# Patient Record
Sex: Female | Born: 1960
Health system: Southern US, Community
[De-identification: ages and names within clinical notes are randomized; demographics above are authoritative.]

## PROBLEM LIST (undated history)

## (undated) DIAGNOSIS — F32A Depression, unspecified: Secondary | ICD-10-CM

## (undated) DIAGNOSIS — F209 Schizophrenia, unspecified: Secondary | ICD-10-CM

## (undated) DIAGNOSIS — J309 Allergic rhinitis, unspecified: Secondary | ICD-10-CM

## (undated) DIAGNOSIS — F419 Anxiety disorder, unspecified: Secondary | ICD-10-CM

## (undated) DIAGNOSIS — F329 Major depressive disorder, single episode, unspecified: Secondary | ICD-10-CM

## (undated) HISTORY — DX: Allergic rhinitis, unspecified: J30.9

## (undated) HISTORY — DX: Anxiety disorder, unspecified: F41.9

## (undated) HISTORY — DX: Depression, unspecified: F32.A

## (undated) HISTORY — DX: Major depressive disorder, single episode, unspecified: F32.9

## (undated) HISTORY — PX: TONSILLECTOMY: SUR1361

---

## 2015-10-07 DIAGNOSIS — K134 Granuloma and granuloma-like lesions of oral mucosa: Secondary | ICD-10-CM | POA: Diagnosis not present

## 2015-10-07 DIAGNOSIS — J312 Chronic pharyngitis: Secondary | ICD-10-CM | POA: Diagnosis not present

## 2015-10-10 DIAGNOSIS — F250 Schizoaffective disorder, bipolar type: Secondary | ICD-10-CM | POA: Diagnosis not present

## 2015-10-24 DIAGNOSIS — K120 Recurrent oral aphthae: Secondary | ICD-10-CM | POA: Diagnosis not present

## 2015-12-05 DIAGNOSIS — F250 Schizoaffective disorder, bipolar type: Secondary | ICD-10-CM | POA: Diagnosis not present

## 2016-02-06 DIAGNOSIS — F250 Schizoaffective disorder, bipolar type: Secondary | ICD-10-CM | POA: Diagnosis not present

## 2016-03-19 DIAGNOSIS — F250 Schizoaffective disorder, bipolar type: Secondary | ICD-10-CM | POA: Diagnosis not present

## 2016-05-03 DIAGNOSIS — R5383 Other fatigue: Secondary | ICD-10-CM | POA: Diagnosis not present

## 2016-05-03 DIAGNOSIS — Z8659 Personal history of other mental and behavioral disorders: Secondary | ICD-10-CM | POA: Diagnosis not present

## 2016-05-03 DIAGNOSIS — M791 Myalgia: Secondary | ICD-10-CM | POA: Diagnosis not present

## 2016-05-04 DIAGNOSIS — F250 Schizoaffective disorder, bipolar type: Secondary | ICD-10-CM | POA: Diagnosis not present

## 2016-05-07 DIAGNOSIS — F250 Schizoaffective disorder, bipolar type: Secondary | ICD-10-CM | POA: Diagnosis not present

## 2016-05-25 DIAGNOSIS — Z1239 Encounter for other screening for malignant neoplasm of breast: Secondary | ICD-10-CM | POA: Diagnosis not present

## 2016-05-25 DIAGNOSIS — R922 Inconclusive mammogram: Secondary | ICD-10-CM | POA: Diagnosis not present

## 2016-05-25 DIAGNOSIS — Z1231 Encounter for screening mammogram for malignant neoplasm of breast: Secondary | ICD-10-CM | POA: Diagnosis not present

## 2016-06-03 DIAGNOSIS — F250 Schizoaffective disorder, bipolar type: Secondary | ICD-10-CM | POA: Diagnosis not present

## 2016-06-18 DIAGNOSIS — F250 Schizoaffective disorder, bipolar type: Secondary | ICD-10-CM | POA: Diagnosis not present

## 2016-06-24 DIAGNOSIS — F250 Schizoaffective disorder, bipolar type: Secondary | ICD-10-CM | POA: Diagnosis not present

## 2016-07-20 DIAGNOSIS — F250 Schizoaffective disorder, bipolar type: Secondary | ICD-10-CM | POA: Diagnosis not present

## 2016-07-27 DIAGNOSIS — F250 Schizoaffective disorder, bipolar type: Secondary | ICD-10-CM | POA: Diagnosis not present

## 2016-09-07 DIAGNOSIS — H2513 Age-related nuclear cataract, bilateral: Secondary | ICD-10-CM | POA: Diagnosis not present

## 2016-09-14 DIAGNOSIS — Z23 Encounter for immunization: Secondary | ICD-10-CM | POA: Diagnosis not present

## 2016-09-14 DIAGNOSIS — R5383 Other fatigue: Secondary | ICD-10-CM | POA: Diagnosis not present

## 2016-09-14 DIAGNOSIS — Z1211 Encounter for screening for malignant neoplasm of colon: Secondary | ICD-10-CM | POA: Diagnosis not present

## 2016-09-14 DIAGNOSIS — R351 Nocturia: Secondary | ICD-10-CM | POA: Diagnosis not present

## 2016-09-14 DIAGNOSIS — M791 Myalgia: Secondary | ICD-10-CM | POA: Diagnosis not present

## 2016-09-14 DIAGNOSIS — I2510 Atherosclerotic heart disease of native coronary artery without angina pectoris: Secondary | ICD-10-CM | POA: Diagnosis not present

## 2016-09-14 DIAGNOSIS — Z1212 Encounter for screening for malignant neoplasm of rectum: Secondary | ICD-10-CM | POA: Diagnosis not present

## 2016-09-14 DIAGNOSIS — Z0000 Encounter for general adult medical examination without abnormal findings: Secondary | ICD-10-CM | POA: Diagnosis not present

## 2016-09-14 DIAGNOSIS — Z7189 Other specified counseling: Secondary | ICD-10-CM | POA: Diagnosis not present

## 2016-09-14 DIAGNOSIS — Z8659 Personal history of other mental and behavioral disorders: Secondary | ICD-10-CM | POA: Diagnosis not present

## 2016-09-14 DIAGNOSIS — R002 Palpitations: Secondary | ICD-10-CM | POA: Diagnosis not present

## 2016-09-21 DIAGNOSIS — F6089 Other specific personality disorders: Secondary | ICD-10-CM | POA: Diagnosis not present

## 2016-09-21 DIAGNOSIS — F250 Schizoaffective disorder, bipolar type: Secondary | ICD-10-CM | POA: Diagnosis not present

## 2016-11-01 DIAGNOSIS — Z23 Encounter for immunization: Secondary | ICD-10-CM | POA: Diagnosis not present

## 2016-11-16 DIAGNOSIS — Z124 Encounter for screening for malignant neoplasm of cervix: Secondary | ICD-10-CM | POA: Diagnosis not present

## 2016-11-16 DIAGNOSIS — Z1212 Encounter for screening for malignant neoplasm of rectum: Secondary | ICD-10-CM | POA: Diagnosis not present

## 2016-11-16 DIAGNOSIS — R351 Nocturia: Secondary | ICD-10-CM | POA: Diagnosis not present

## 2016-11-16 DIAGNOSIS — Z1389 Encounter for screening for other disorder: Secondary | ICD-10-CM | POA: Diagnosis not present

## 2016-11-16 DIAGNOSIS — Z01419 Encounter for gynecological examination (general) (routine) without abnormal findings: Secondary | ICD-10-CM | POA: Diagnosis not present

## 2016-12-08 DIAGNOSIS — M545 Low back pain: Secondary | ICD-10-CM | POA: Diagnosis not present

## 2016-12-21 DIAGNOSIS — F6089 Other specific personality disorders: Secondary | ICD-10-CM | POA: Diagnosis not present

## 2016-12-21 DIAGNOSIS — F250 Schizoaffective disorder, bipolar type: Secondary | ICD-10-CM | POA: Diagnosis not present

## 2017-03-22 DIAGNOSIS — F6089 Other specific personality disorders: Secondary | ICD-10-CM | POA: Diagnosis not present

## 2017-03-22 DIAGNOSIS — F250 Schizoaffective disorder, bipolar type: Secondary | ICD-10-CM | POA: Diagnosis not present

## 2017-05-10 DIAGNOSIS — F250 Schizoaffective disorder, bipolar type: Secondary | ICD-10-CM | POA: Diagnosis not present

## 2017-05-10 DIAGNOSIS — F6089 Other specific personality disorders: Secondary | ICD-10-CM | POA: Diagnosis not present

## 2017-05-17 DIAGNOSIS — F250 Schizoaffective disorder, bipolar type: Secondary | ICD-10-CM | POA: Diagnosis not present

## 2017-05-17 DIAGNOSIS — F6089 Other specific personality disorders: Secondary | ICD-10-CM | POA: Diagnosis not present

## 2017-06-07 DIAGNOSIS — F250 Schizoaffective disorder, bipolar type: Secondary | ICD-10-CM | POA: Diagnosis not present

## 2017-06-07 DIAGNOSIS — F6089 Other specific personality disorders: Secondary | ICD-10-CM | POA: Diagnosis not present

## 2017-06-21 DIAGNOSIS — F6089 Other specific personality disorders: Secondary | ICD-10-CM | POA: Diagnosis not present

## 2017-06-21 DIAGNOSIS — F250 Schizoaffective disorder, bipolar type: Secondary | ICD-10-CM | POA: Diagnosis not present

## 2017-09-20 DIAGNOSIS — F39 Unspecified mood [affective] disorder: Secondary | ICD-10-CM | POA: Diagnosis not present

## 2017-10-11 ENCOUNTER — Encounter: Payer: Self-pay | Admitting: Family Medicine

## 2017-10-11 ENCOUNTER — Ambulatory Visit (INDEPENDENT_AMBULATORY_CARE_PROVIDER_SITE_OTHER): Payer: Medicare Other | Admitting: Family Medicine

## 2017-10-11 ENCOUNTER — Other Ambulatory Visit: Payer: Self-pay

## 2017-10-11 VITALS — BP 128/80 | HR 77 | Temp 98.5°F | Ht 62.0 in | Wt 146.6 lb

## 2017-10-11 DIAGNOSIS — R5383 Other fatigue: Secondary | ICD-10-CM

## 2017-10-11 DIAGNOSIS — Z0000 Encounter for general adult medical examination without abnormal findings: Secondary | ICD-10-CM | POA: Diagnosis not present

## 2017-10-11 DIAGNOSIS — F319 Bipolar disorder, unspecified: Secondary | ICD-10-CM | POA: Insufficient documentation

## 2017-10-11 MED ORDER — LORATADINE 10 MG PO TABS: 10.0000 mg | ORAL_TABLET | Freq: Every day | ORAL | 2 refills | Status: DC

## 2017-10-11 NOTE — Patient Instructions (Signed)
Thank you for coming to see me today. It was a pleasure! Today we talked about:   Your health. Please continue trying to exercise. I suggest you follow up closely with your psychologist.   It is recommended in your age group that you have a screening colonoscopy for colorectal cancer. I have put in the referral for this.   Please follow-up with me in 1 year or sooner, as needed.  If you have any questions or concerns, please do not hesitate to call the office at [phone].  Take Care,   [place] Kashon Kray[place], DO

## 2017-10-11 NOTE — Progress Notes (Signed)
Subjective:    Patient ID: Tanya Simpson, female    DOB: 01/10/1961, 57 y.o.   MRN: [id number]   CC: Establishing care with primary physician  HPI:  Healthcare Maintenance - Vaccines: flu-patient would like to get vaccine today - Colonoscopy: due and discussed with patient, - Mammogram:  Reportedly done in 2018 [place] Exam: Patient would like referral to an ophthalmologist - Lipid Panel: Patient unsure when her last blood work was drawn but does not believe it was done in the last year -Patient exercises 4-5 times per week and is trying to increase this amount.  She enjoys swimming and walking  Weakness: -Patient reporting that she has been feeling more tired than usual. -Patient with extensive psychological background that is followed closely by her psychologist. -Recently moved from [place] 6 months ago but has already established care with a psychologist in the area who will continue to counsel and manage her medications. -Patient says she has been told she has depression, anxiety, and bipolar disorder. -Patient believes that her current medication regimen and is good for her, however she thinks that her medications are causing her to be more tired. -Reports lightheadedness upon standing and when she gets out of bed quickly at night to use the bathroom. -Says this is been going on for a few months now.  Reports that she has had some history of anemia before. -Patient does not feel as if she is more depressed at this time.  She has things that she struggles with daily given that her previous husband committed suicide in 2 of her children no longer speak with her and blame her for her husband's death.  She reports that this often haunts her and she is in a bad place, but she feels like she can talk to her counselor and she believes that she has a good fit at her current office.  She is trying to establish care at this office in order to manage any other health issues that  may come up.  Smoking status reviewed  Review of Systems  Per HPI, else denies recent illness, fever, headache, changes in vision, chest pain, shortness of breath, abdominal pain, N/V/D, weakness  History reviewed. No pertinent surgical history.  Family History  Problem Relation Age of Onset  . Hypertension Mother   . CVA Father     Patient Active Problem List   Diagnosis Date Noted  . Healthcare maintenance 10/12/2017  . Fatigue 10/12/2017  . Depression      Objective:  BP 128/80   Pulse 77   Temp 98.5 F (36.9 C) (Oral)   Ht 5\' 2"  (1.575 m)   Wt 66.5 kg (146 lb 9.6 oz)   LMP 07/13/2017 (Approximate)   SpO2 100%   BMI 26.81 kg/m  Vitals and nursing note reviewed  General: NAD, pleasant Cardiac: RRR, normal heart sounds, no murmurs Respiratory: CTAB, normal effort Abdomen: soft, nontender, nondistended. Bowel sounds present Extremities: no edema or cyanosis. WWP. Skin: warm and dry, no rashes noted Neuro: alert and oriented, no focal deficits Psych: pressured speech  GAD 7 : Generalized Anxiety Score 10/11/2017  Nervous, Anxious, on Edge 0  Control/stop worrying 1  Worry too much - different things 0  Trouble relaxing 0  Restless 0  Easily annoyed or irritable 1  Afraid - awful might happen 1  Total GAD 7 Score 3  Anxiety Difficulty Somewhat difficult   Depression screen PHQ 2/9 10/11/2017  Decreased Interest 0  Down, Depressed,  Hopeless 0  PHQ - 2 Score 0  Altered sleeping 0  Tired, decreased energy 2  Change in appetite 1  Feeling bad or failure about yourself  0  Trouble concentrating 0  Moving slowly or fidgety/restless 0  Suicidal thoughts 0  PHQ-9 Score 3  Difficult doing work/chores Somewhat difficult    Assessment & Plan:    Healthcare maintenance Blood pressure is normal today at 128/80. Patient informed that she is at the age where she could require a screening colonoscopy.  Reports that she does not like the idea of this but she also  is not interested in alternate testing.  Given referral and patient encouraged to think about different options and explained FOBT testing vs FIT testing and colonoscopy options.   Also obtained Lipid Panel.  Encourage patient to continue her exercise and recommended a total of at least 150 minutes of cardio per week.  Fatigue We will obtain CBC to rule out anemia as cause of fatigue.  Patient also with extensive psychological history and PHQ 9 as well as gad 7 were scores of 3.  Patient encouraged to continue talking with her counselor as she is quite troubled by some of her things from her past.  Also instructed to allow time after sitting up in bed before going to the bathroom in the middle of night.      [name] Dekota Shenk, DO Family Medicine Resident PGY-1

## 2017-10-12 ENCOUNTER — Encounter: Payer: Self-pay | Admitting: Family Medicine

## 2017-10-12 DIAGNOSIS — R5383 Other fatigue: Secondary | ICD-10-CM | POA: Insufficient documentation

## 2017-10-12 DIAGNOSIS — Z0000 Encounter for general adult medical examination without abnormal findings: Secondary | ICD-10-CM | POA: Insufficient documentation

## 2017-10-12 LAB — CBC
HEMATOCRIT: 38 % (ref 34.0–46.6)
HEMOGLOBIN: 12.7 g/dL (ref 11.1–15.9)
MCH: 30.9 pg (ref 26.6–33.0)
MCHC: 33.4 g/dL (ref 31.5–35.7)
MCV: 93 fL (ref 79–97)
Platelets: 365 10*3/uL (ref 150–379)
RBC: 4.11 x10E6/uL (ref 3.77–5.28)
RDW: 12.8 % (ref 12.3–15.4)
WBC: 8.9 10*3/uL (ref 3.4–10.8)

## 2017-10-12 LAB — LIPID PANEL
CHOL/HDL RATIO: 2.8 ratio (ref 0.0–4.4)
CHOLESTEROL TOTAL: 187 mg/dL (ref 100–199)
HDL: 66 mg/dL (ref >39)
LDL Calculated: 100 mg/dL — ABNORMAL HIGH (ref 0–99)
TRIGLYCERIDES: 105 mg/dL (ref 0–149)
VLDL Cholesterol Cal: 21 mg/dL (ref 5–40)

## 2017-10-12 NOTE — Assessment & Plan Note (Addendum)
Blood pressure is normal today at 128/80. Patient informed that she is at the age where she could require a screening colonoscopy.  Reports that she does not like the idea of this but she also is not interested in alternate testing.  Given referral and patient encouraged to think about different options and explained FOBT testing vs FIT testing and colonoscopy options.   Also obtained Lipid Panel.  Encourage patient to continue her exercise and recommended a total of at least 150 minutes of cardio per week.

## 2017-10-12 NOTE — Assessment & Plan Note (Signed)
We will obtain CBC to rule out anemia as cause of fatigue.  Patient also with extensive psychological history and PHQ 9 as well as gad 7 were scores of 3.  Patient encouraged to continue talking with her counselor as she is quite troubled by some of her things from her past.  Also instructed to allow time after sitting up in bed before going to the bathroom in the middle of night.

## 2017-11-09 DIAGNOSIS — F39 Unspecified mood [affective] disorder: Secondary | ICD-10-CM | POA: Diagnosis not present

## 2017-12-07 DIAGNOSIS — F39 Unspecified mood [affective] disorder: Secondary | ICD-10-CM | POA: Diagnosis not present

## 2018-02-08 DIAGNOSIS — F39 Unspecified mood [affective] disorder: Secondary | ICD-10-CM | POA: Diagnosis not present

## 2018-02-28 ENCOUNTER — Ambulatory Visit (INDEPENDENT_AMBULATORY_CARE_PROVIDER_SITE_OTHER): Payer: Medicare Other | Admitting: Psychology

## 2018-02-28 DIAGNOSIS — F319 Bipolar disorder, unspecified: Secondary | ICD-10-CM

## 2018-02-28 NOTE — Patient Instructions (Signed)
Please schedule a follow-up appointment with Dr. Pascal Lux at 10:30 on June 11th.  You can call me at [phone] if you need to reschedule this appointment.  We talked about tackling a few issues:  One is the relationship with your daughter and the second is your activity level.  Hopefully you can feel less fatigued.

## 2018-02-28 NOTE — Progress Notes (Signed)
Patient requested an integrated care appointment after learning about the services from her husband, who has seen Sammuel Hines.  She has seen a counselor connecting to her psychiatric care two times but the copay is cost-prohibitive.    Presenting Issue:  She has a significant psychiatric history.  Her biggest reported concerns today are not having a relationship with her adult daughter and feeling fatigued.   Report of symptoms:  Feeling fatigued in the morning.  Needs coffee to get going.  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  Denied any other significant symptoms.  Self-report measures are both WNL.  Duration of CURRENT symptoms:  Symptoms since early adulthood that she manages with medication.  Age of onset of first mood disturbance:  Sounds like early 52s.  Impact on function:  On disability for "depression" since 2013.  Works under 20 hours of week at [employer].  Not in trouble at work.  Married nearly 14 years to current husband.  Has a relationship with her son.  Recently got a dog.  Sounds like she is able to accomplish most of what she wants to accomplish currently.    Psychiatric History - Diagnoses: Reported depression but upon further questioning, she mentions she was diagnosed with Bipolar Disorder.  She doesn't remember if she was diagnosed with schizophrenia.   - Hospitalizations: "Too many to report."  She guessed between 8-10. - Pharmacotherapy: Did not get a history.  Reports current medications include Lamictal, Haldol  and then Haldol .  Cogentin.   - Outpatient therapy: Saw a counselor twice in [place] but it was cost prohibitive.    Family history of psychiatric issues:  Father was "paranoid"  Sounds like he might have had symptoms of Bipolar Disorder and / or schizophrenia.  Her first husband committed suicide in 1995.  Current and history of substance use:  Did not assess.    Medical conditions that might explain or  contribute to symptoms:  Problem list reviewed.  Family system:  Has two adult children, a son 29 and a daughter 67.  Both were raised by her deceased husband's brother and his wife because of what sounds like an inability to parent effectively at the time.  Has a relationship with the son.  Saw the daughter once when she was 27.  No contact since then.  Daughter is not interested.    Husband (nine years her senior) has two grown sons (age 39 and 32).    Faith:  Describes herself as born Jewish but converted to Catholicism.  Attends [employer] and thinks prayer helps her maintain her mental health.      PHQ-9:  3 GAD-7:  3

## 2018-02-28 NOTE — Assessment & Plan Note (Signed)
Ms. Mentel is neatly groomed and appropriately dressed.  She maintains adequate eye contact and is cooperative and attentive.  Speech is normal in tone and rhythm with an elevated rate.  Mood is euthymic with a consistent affect.  Thought process is circumstantial.  Denied suicidal ideation.  Does not appear to be responding to any internal stimuli but had some difficulty maintaining train of thought.  Judgment and insight seem below average.  Based on med list, patient report, and presentation today, Bipolar Disorder 1 seems like a likely diagnosis.  Schizophrenia is a possibility as well.  She likely would benefit from more intensive services than what [facility] has to offer, however these are cost prohibitive given her insurance.  I provided the parameters of the clinic and suggested we focus on one or two issues.  She was in agreement.  Given her report of a stable marriage and job (she worked at [employer] in [place] before transferring to the [employer]), her level of function is reasonable.  Will need to check on the date of her last hospitalization and also the consistency of her psychiatric care in [place].  For now, will focus on her two identified issues:  Her relationship with her daughter and her activity level.  We scheduled a follow-up for 6/11 at 10:30.

## 2018-03-14 ENCOUNTER — Ambulatory Visit: Payer: Medicare Other | Admitting: Psychology

## 2018-03-14 DIAGNOSIS — F3170 Bipolar disorder, currently in remission, most recent episode unspecified: Secondary | ICD-10-CM

## 2018-03-14 NOTE — Progress Notes (Signed)
Reason for follow-up:  Patient presents for follow-up.  Wanted to get two pieces of information from last visit and then rest focused on her goal of increasing her activity level.   Issues discussed:    Psychiatric history:  Last hospitalization was reported as about five years ago.  Has consistent psychiatric care by a NP at [facility] (3-4 months).  She has been on these medications long term.  Has exercised about 3 times in the last two weeks.  Would like more consistent exercise but fatigue (feeling tired) gets in the way.  Discussed at length.  Is walking the dog 10-12 minutes twice a day.  Likes a range of activity including walking, running, and swimming.  Has access to a pool, gym, and outdoor possibilities.

## 2018-03-14 NOTE — Patient Instructions (Signed)
You have a follow-up scheduled for:  June 25th at 10:30.  You thought increasing your activity level to two or three times a week would be helpful.  Wednesday and Friday are the two days you are targeting for consistent exercise.  Use the 5 minutes rule on these days if you DON'T feel like exercising.  You can stop if you are still too tired after five minutes.  Not starting at all is not an option.  If you wait to feel like exercising, you may never do it.  Also - you can continue to increase your activity by walking the dog and doing other things that you feel like doing - including the weights in the gym, treadmill, etc..Marland Kitchen.These are bonus things.  Feel good about them.

## 2018-03-14 NOTE — Assessment & Plan Note (Signed)
Self-report measures look really good.  Function appears to be consistent.  Is having new friends over for dinner tonight.  Continues to work 15-20 hours a week.  Is busy with a new dog and taking it to training starting tomorrow.  Interested in increasing her activity level.  Drilled down on this focusing on identifying a specific goal, five minute rule, and choosing activities (at least initially) that are enjoyable.  See patient instructions for further plan.

## 2018-03-28 ENCOUNTER — Ambulatory Visit (INDEPENDENT_AMBULATORY_CARE_PROVIDER_SITE_OTHER): Payer: Medicare Other | Admitting: Psychology

## 2018-03-28 DIAGNOSIS — F3170 Bipolar disorder, currently in remission, most recent episode unspecified: Secondary | ICD-10-CM

## 2018-03-28 NOTE — Assessment & Plan Note (Signed)
Self-report measures look good.  She has pressured speech and is tangential.  Irritability seems present as well but managed.  Denies feeling overly sad.  No evidence of SI or HI.  Reports feeling overly tired today because of working the weekend and Monday.  Thinks she might do better on a Thursday.  See patient instructions for further plan.

## 2018-03-28 NOTE — Patient Instructions (Addendum)
Please schedule an appointment for integrated care to see Dr. Pascal LuxKane on July 18th at 11:00.  We talked about looking for YouTube videos for short weight based work-outs as a way to get stronger.    Also, you recognize when you do too much, you end up in a bad spot.  Pacing yourself (periods of rest and activity) is likely the best thing.  I also think that letting go of the weight as much as you can, is a healthy idea.  Feed your body well, move your body, and hopefully that will get you to a healthy place.

## 2018-03-28 NOTE — Progress Notes (Signed)
Reason for follow-up:  Patient presents for follow-up on a few behavioral issues in the context of chronic mental illness.    Issues discussed:    She has had variable success with exercise.  Last week she reports she was very busy, did too much, and had difficulty functioning (fatigue and emotional control) over the weekend.  She had to leave work and then wasn't able to stay in church.  She plans to do less this week and be more intentional with the activities she chooses.  She would like to focus on lifting weights in hopes of getting stronger both for her own health and so she can lift things at [employer].  She reports she is feeling a bit lonely.  She is trying to get connected at church but summer programming is slow.  Discussed options.

## 2018-04-20 ENCOUNTER — Ambulatory Visit (INDEPENDENT_AMBULATORY_CARE_PROVIDER_SITE_OTHER): Payer: Medicare Other | Admitting: Psychology

## 2018-04-20 DIAGNOSIS — F310 Bipolar disorder, current episode hypomanic: Secondary | ICD-10-CM

## 2018-04-20 NOTE — Progress Notes (Signed)
Reason for follow-up:  This is visit four in [facility].  Patient is following for specific goals around diet and exercise in the context of chronic mental illness.  Issues discussed:  She is not happy with her weight.  She does think she has made strides with regards to exercise.  She reports it is embarrassing to come in and talk about her goals when she is failing.  She reports that her weight has been stable over the past 6 months or so.  Patient spoke of a variety of other issues including her mother, first marriage, children, current marriage, job, finances, college days, and dog.

## 2018-04-20 NOTE — Assessment & Plan Note (Signed)
Self-report measures remain low.  Thought process is tangential.  Speech is pressured.  She also had a bit more labile affect today.  She has follow up with psychiatry in two weeks.  Taking medicines as prescribed.  No evidence of SI.    She continues to work at [employer] and says her marriage is okay.  Those are the main functional indicators I have.  Encouraged her to be more gracious and kind with herself regarding her body.  She is fairly critical of herself in this regard.  Focus on moving her body and feeding it well and let go of the number on the scale.  She voiced an understanding but we both recognize she has been dissatisfied with her body her entire life.  She will consider whether she wants to come back. I am not sure what type of therapy would be helpful her.  Perhaps DBT in a longer term therapy environment.  Medication will likely be necessary to help with her mood and thoughts process.  She will call to schedule if she would like to continue work on her goals.

## 2018-05-02 ENCOUNTER — Telehealth: Payer: Self-pay | Admitting: Psychology

## 2018-05-02 NOTE — Telephone Encounter (Signed)
Tanya Simpson called and left a VM thanking me for the time I spent with her in [facility].  She reported that she is going to seek longer-term counseling somewhere close to her home.

## 2018-05-10 DIAGNOSIS — F39 Unspecified mood [affective] disorder: Secondary | ICD-10-CM | POA: Diagnosis not present

## 2018-08-05 ENCOUNTER — Encounter

## 2018-08-17 ENCOUNTER — Encounter: Payer: Self-pay | Admitting: Emergency Medicine

## 2018-09-05 ENCOUNTER — Ambulatory Visit (INDEPENDENT_AMBULATORY_CARE_PROVIDER_SITE_OTHER): Payer: Medicare Other | Admitting: Psychiatry

## 2018-09-05 ENCOUNTER — Encounter: Payer: Self-pay | Admitting: Psychiatry

## 2018-09-05 VITALS — BP 104/71 | HR 76

## 2018-09-05 DIAGNOSIS — F39 Unspecified mood [affective] disorder: Secondary | ICD-10-CM

## 2018-09-05 DIAGNOSIS — G2401 Drug induced subacute dyskinesia: Secondary | ICD-10-CM | POA: Diagnosis not present

## 2018-09-05 DIAGNOSIS — F29 Unspecified psychosis not due to a substance or known physiological condition: Secondary | ICD-10-CM | POA: Diagnosis not present

## 2018-09-05 MED ORDER — BENZTROPINE MESYLATE 1 MG PO TABS: 1.0000 mg | ORAL_TABLET | Freq: Every day | ORAL | 6 refills | Status: DC

## 2018-09-05 MED ORDER — LAMOTRIGINE 100 MG PO TABS: 100.0000 mg | ORAL_TABLET | Freq: Every day | ORAL | 6 refills | Status: DC

## 2018-09-05 MED ORDER — HALOPERIDOL 5 MG PO TABS: ORAL_TABLET | ORAL | 6 refills | Status: DC

## 2018-09-05 NOTE — Progress Notes (Signed)
Tanya Simpson [id number] 02-16-1961 57 y.o.  Subjective:   Patient ID:  Tanya Simpson is a 57 y.o. (DOB [birth year]) female.  Chief Complaint:  Chief Complaint  Patient presents with  . Follow-up    h/o Mood instability, psychosis    HPI Tanya Simpson presents to the office today for follow-up of h/o mood, anxiety, and psychosis. She reports that she has been unable to get Haldol 10 mg from [facility] and would like script changed to 5 mg tabs. She reports that her mood has been "pretty good" other than some sadness on Thanksgiving since husband was working and other family members were not around. Denies irritability or elevated moods. Reports that she "gets a little nervous, even thinking about using Benedetto Goad and thinking somebody might try to shoot me." Denies paranoia or hallucinations. Denies panic attacks. Reports that she has been sleeping well. Appetite has been good. Reports that she has been gaining weight and would like to lose weight. Reports that energy is lower in the morning and improves as the day progresses. "It takes me awhile to get going in the mornings." Motivation is adequate.She reports that her memory is not good "and hasn't been for a long time." Reports that concentration is adequate. Denies SI.   Enjoying her dog.   Review of Systems:  Review of Systems  Respiratory: Positive for cough.   Musculoskeletal: Negative for gait problem.  Allergic/Immunologic: Positive for environmental allergies.  Neurological: Negative for tremors.  Psychiatric/Behavioral:       Please refer to HPI    Medications: I have reviewed the patient's current medications.  Current Outpatient Medications  Medication Sig Dispense Refill  . benztropine (COGENTIN) 1 MG tablet Take 1 tablet (1 mg total) by mouth daily. 30 tablet 6  . haloperidol (HALDOL) 5 MG tablet Take 1 tab po q am and 2 tabs po QHS 90 tablet 6  . lamoTRIgine (LAMICTAL) 100 MG tablet Take 1 tablet (100 mg total) by mouth daily. 30  tablet 6  . loratadine (CLARITIN) 10 MG tablet Take 1 tablet (10 mg total) by mouth daily. 90 tablet 2  . Multiple Vitamin (MULTIVITAMIN) tablet Take 1 tablet by mouth every other day.     No current facility-administered medications for this visit.     Medication Side Effects: None  Allergies: No Known Allergies  Past Medical History:  Diagnosis Date  . Allergic rhinitis   . Anxiety   . Depression     Family History  Problem Relation Age of Onset  . Hypertension Mother   . Impulse control disorder Mother   . CVA Father   . Depression Father     Social History   Socioeconomic History  . Marital status: Married    Spouse name: [name]  . Number of children: Not on file  . Years of education: Not on file  . Highest education level: Not on file  Occupational History  . Occupation: [employer]  Social Needs  . Financial resource strain: Not on file  . Food insecurity:    Worry: Not on file    Inability: Not on file  . Transportation needs:    Medical: Not on file    Non-medical: Not on file  Tobacco Use  . Smoking status: Former Smoker    Packs/day: 1.00    Years: 1.00    Pack years: 1.00  . Smokeless tobacco: Never Used  Substance and Sexual Activity  . Alcohol use: Yes    Alcohol/week: 1.0 standard drinks  Types: 1 Glasses of wine per week    Comment: very little  . Drug use: No  . Sexual activity: Yes    Partners: Male  Lifestyle  . Physical activity:    Days per week: 5 days    Minutes per session: 30 min  . Stress: Not on file  Relationships  . Social connections:    Talks on phone: Not on file    Gets together: Not on file    Attends religious service: Not on file    Active member of club or organization: Not on file    Attends meetings of clubs or organizations: Not on file    Relationship status: Not on file  . Intimate partner violence:    Fear of current or ex partner: Not on file    Emotionally abused: Not on file    Physically abused:  Not on file    Forced sexual activity: Not on file  Other Topics Concern  . Not on file  Social History Narrative  . Not on file    Past Medical History, Surgical history, Social history, and Family history were reviewed and updated as appropriate.   Please see review of systems for further details on the patient's review from today.   Objective:   Physical Exam:  BP 104/71   Pulse 76   Physical Exam  Lab Review:  No results found for: NA, K, CL, CO2, GLUCOSE, BUN, CREATININE, CALCIUM, PROT, ALBUMIN, AST, ALT, ALKPHOS, BILITOT, GFRNONAA, GFRAA     Component Value Date/Time   WBC 8.9 10/11/2017 1422   RBC 4.11 10/11/2017 1422   HGB 12.7 10/11/2017 1422   HCT 38.0 10/11/2017 1422   PLT 365 10/11/2017 1422   MCV 93 10/11/2017 1422   MCH 30.9 10/11/2017 1422   MCHC 33.4 10/11/2017 1422   RDW 12.8 10/11/2017 1422    No results found for: POCLITH, LITHIUM   No results found for: PHENYTOIN, PHENOBARB, VALPROATE, CBMZ   .res Assessment: Plan:   Continue Haldol 5 mg po q am and 10 mg po QHS for mood and psychosis. Continue Lamictal 100 mg po qd for mood.  Continue Cogentin 1 mg po qd TD.   Mood disorder (HCC) - Stable - Plan: haloperidol (HALDOL) 5 MG tablet, lamoTRIgine (LAMICTAL) 100 MG tablet  Tardive dyskinesia - Stable - Plan: benztropine (COGENTIN) 1 MG tablet  Psychosis, unspecified psychosis type (HCC) - In remission  Please see After Visit Summary for patient specific instructions.  No future appointments.  No orders of the defined types were placed in this encounter.     -------------------------------

## 2018-10-31 DIAGNOSIS — F3112 Bipolar disorder, current episode manic without psychotic features, moderate: Secondary | ICD-10-CM | POA: Diagnosis not present

## 2018-12-19 ENCOUNTER — Other Ambulatory Visit: Payer: Self-pay

## 2018-12-19 ENCOUNTER — Encounter: Payer: Self-pay | Admitting: Family Medicine

## 2018-12-19 ENCOUNTER — Ambulatory Visit (INDEPENDENT_AMBULATORY_CARE_PROVIDER_SITE_OTHER): Payer: Medicare Other | Admitting: Family Medicine

## 2018-12-19 VITALS — BP 120/60 | Temp 98.0°F | Ht 62.0 in | Wt 145.0 lb

## 2018-12-19 DIAGNOSIS — Z1231 Encounter for screening mammogram for malignant neoplasm of breast: Secondary | ICD-10-CM | POA: Diagnosis not present

## 2018-12-19 DIAGNOSIS — Z1159 Encounter for screening for other viral diseases: Secondary | ICD-10-CM | POA: Diagnosis not present

## 2018-12-19 DIAGNOSIS — Z114 Encounter for screening for human immunodeficiency virus [HIV]: Secondary | ICD-10-CM

## 2018-12-19 DIAGNOSIS — Z124 Encounter for screening for malignant neoplasm of cervix: Secondary | ICD-10-CM | POA: Diagnosis not present

## 2018-12-19 DIAGNOSIS — Z1211 Encounter for screening for malignant neoplasm of colon: Secondary | ICD-10-CM

## 2018-12-19 DIAGNOSIS — Z0000 Encounter for general adult medical examination without abnormal findings: Secondary | ICD-10-CM | POA: Diagnosis not present

## 2018-12-19 NOTE — Assessment & Plan Note (Signed)
BP WNL. Referral placed for patient to have colonoscopy done. Referral placed for patient to have mammogram done Patient refused any lab draws at this time; will reschedule Pap smear along with blood draws including screening for hep C, HIV, lipid panel

## 2018-12-19 NOTE — Patient Instructions (Signed)
Thank you for coming to see me today. It was a pleasure! Today we talked about:   Please schedule your mammogram. Please schedule a colonoscopy.   Please call for your pap smear and blood work after COVID is resolved.   Please follow-up with me as above.  If you have any questions or concerns, please do not hesitate to call the office at [phone].  Take Care,   [name] Cicily Bonano, DO   Coronavirus (COVID-19) Are you at risk?  Are you at risk for the Coronavirus (COVID-19)?  To be considered HIGH RISK for Coronavirus (COVID-19), you have to meet the following criteria:  . Traveled to [place], [place], [place], [place] or [place]; or in the [place] to [place], [place], [place], or [place]; and have fever, cough, and shortness of breath within the last 2 weeks of travel OR . Been in close contact with a person diagnosed with COVID-19 within the last 2 weeks and have fever, cough, and shortness of breath . IF YOU DO NOT MEET THESE CRITERIA, YOU ARE CONSIDERED LOW RISK FOR COVID-19.  What to do if you are HIGH RISK for COVID-19?  Marland Kitchen If you are having a medical emergency, call 911. . Seek medical care right away. Before you go to a doctor's office, urgent care or emergency department, call ahead and tell them about your recent travel, contact with someone diagnosed with COVID-19, and your symptoms. You should receive instructions from your physician's office regarding next steps of care.  . When you arrive at healthcare provider, tell the healthcare staff immediately you have returned from visiting [place], [place], [place], [place] or [place]; or traveled in the [place] to [place], [place], [place], or [place]; in the last two weeks or you have been in close contact with a person diagnosed with COVID-19 in the last 2 weeks.   . Tell the health care staff about your symptoms: fever, cough and shortness of breath. . After you have been seen by a medical provider, you  will be either: o Tested for (COVID-19) and discharged home on quarantine except to seek medical care if symptoms worsen, and asked to  - Stay home and avoid contact with others until you get your results (4-5 days)  - Avoid travel on public transportation if possible (such as bus, train, or airplane) or o Sent to the Emergency Department by EMS for evaluation, COVID-19 testing, and possible admission depending on your condition and test results.  What to do if you are LOW RISK for COVID-19?  Reduce your risk of any infection by using the same precautions used for avoiding the common cold or flu:  Marland Kitchen Wash your hands often with soap and warm water for at least 20 seconds.  If soap and water are not readily available, use an alcohol-based hand sanitizer with at least 60% alcohol.  . If coughing or sneezing, cover your mouth and nose by coughing or sneezing into the elbow areas of your shirt or coat, into a tissue or into your sleeve (not your hands). . Avoid shaking hands with others and consider head nods or verbal greetings only. . Avoid touching your eyes, nose, or mouth with unwashed hands.  . Avoid close contact with people who are sick. . Avoid places or events with large numbers of people in one location, like concerts or sporting events. . Carefully consider travel plans you have or are making. . If you are planning any travel outside  or inside the [place], visit the CDC's Travelers' Health webpage for the latest health notices. . If you have some symptoms but not all symptoms, continue to monitor at home and seek medical attention if your symptoms worsen. . If you are having a medical emergency, call 911.   [place] / e-Visit: [email or web address]         [facility]: [facility]: [phone]                   [facility]: [phone]

## 2018-12-19 NOTE — Progress Notes (Signed)
Phone: [phone]  Subjective:  CC -- Annual Physical; With no complaints.  Pt reports she has been feeling fine.  Patient's bipolar and anxiety managed by: Corie Chiquito with [facility]  Cardiovascular: - Dx Hypertension: no  - Dx Hyperlipidemia: no  - Dx Obesity: no  - Physical Activity: yes, walks her dog - Diabetes: yes   Cancer: Colorectal >> Colonoscopy: no, patient has not scheduled that we will place referral  Lung >> Tobacco Use: no  Breast >> Mammogram: yes  Cervical/Endometrial >>  - Postmenopausal: no  - Vaginal Bleeding: yes - Pap Smear: no, patient is on her period today and would like to defer Pap smear until later date, states that she had a normal Pap smear done in [place] about 3 years ago, unsure if HPV testing was done at that time  - Previous Abnormal Pap: Patient denies that we do not have any record  Social: Alcohol Use: yes, patient admits to drinking 1 glass of wine 3-5 times per week Tobacco Use: no  Other Drugs: Used marijuana and cocaine about 40 years ago Risky Sexual Behavior: no  Support and Life at Home: yes    ROS- per HPI  Past Medical History Patient Active Problem List   Diagnosis Date Noted  . Healthcare maintenance 10/12/2017  . Fatigue 10/12/2017  . Bipolar disorder (HCC)     Medications- reviewed and updated Current Outpatient Medications  Medication Sig Dispense Refill  . benztropine (COGENTIN) 1 MG tablet Take 1 tablet (1 mg total) by mouth daily. 30 tablet 6  . haloperidol (HALDOL) 5 MG tablet Take 1 tab po q am and 2 tabs po QHS 90 tablet 6  . lamoTRIgine (LAMICTAL) 100 MG tablet Take 1 tablet (100 mg total) by mouth daily. 30 tablet 6  . loratadine (CLARITIN) 10 MG tablet Take 1 tablet (10 mg total) by mouth daily. 90 tablet 2  . Multiple Vitamin (MULTIVITAMIN) tablet Take 1 tablet by mouth every other day.     No current facility-administered medications for this visit.     Objective: BP 120/60   Temp  98 F (36.7 C) (Oral)   Ht 5\' 2"  (1.575 m)   Wt 145 lb (65.8 kg)   LMP 12/16/2018 (Exact Date)   BMI 26.52 kg/m  Gen: NAD, alert, cooperative with exam HEENT: NCAT, EOMI, PERRL CV: RRR, good S1/S2, no murmur Resp: CTABL, no wheezes, non-labored Abd: Soft, Non Tender, Non Distended, BS present, no guarding or organomegaly Genital Exam: not done Ext: No edema, warm Neuro: Alert and oriented, No gross deficits   Assessment/Plan:  Healthcare maintenance BP WNL. Referral placed for patient to have colonoscopy done. Referral placed for patient to have mammogram done Patient refused any lab draws at this time; will reschedule Pap smear along with blood draws including screening for hep C, HIV, lipid panel   Orders Placed This Encounter  Procedures  . MM Digital Screening    Standing Status:   Future    Standing Expiration Date:   02/18/2020    Order Specific Question:   Reason for Exam (SYMPTOM  OR DIAGNOSIS REQUIRED)    Answer:   breast CA screening    Order Specific Question:   Is the patient pregnant?    Answer:   No    Order Specific Question:   Preferred imaging location?    Answer:   [facility]  . HIV antibody (with reflex)    Standing Status:   Future    Standing  Expiration Date:   12/19/2019  . Hepatitis C Antibody    Standing Status:   Future    Standing Expiration Date:   12/19/2019  . Ambulatory referral to Gastroenterology    Referral Priority:   Routine    Referral Type:   Consultation    Referral Reason:   Specialty Services Required    Number of Visits Requested:   1    [name] Selene Peltzer, DO PGY-2, [facility]  12/19/2018 3:11 PM

## 2018-12-28 ENCOUNTER — Telehealth: Payer: Self-pay | Admitting: Family Medicine

## 2018-12-28 NOTE — Telephone Encounter (Signed)
Pt would like Dr. Talbert Forest to give her a call back to discuss how she is feeling. She says she's not been feeling well lately. She's had a family member pass away recently in [place] and she hasn't been able to travel there so she doesn't know if it's just that and stress along with it. Please call pt back to discuss.   Pt says if you call today 12-28-2018 you can call her home number: [phone] If you call tomorrow, call her mobile number: [phone]

## 2018-12-29 ENCOUNTER — Encounter: Payer: Self-pay | Admitting: Family Medicine

## 2018-12-29 NOTE — Telephone Encounter (Signed)
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 I will write her a work note and will have office mail it to her.   Patient advised to take tylenol for her headaches and bodyaches. Patient also advised to call psychiatrist to discuss her emotional time now with the recent death of her family member, but she states she is currently feeling ok about it all.   Given strict return precautions for worsening respiratory symptoms and advised to go to the ED if she has any trouble breathing. Otherwise, patient should self-quarantine for 14 days and avoid going to work.

## 2019-01-04 ENCOUNTER — Telehealth: Payer: Self-pay | Admitting: Family Medicine

## 2019-01-04 NOTE — Telephone Encounter (Signed)
Hi DR. Talbert Forest,  I am unsure of the reason patient was calling but I can call her back and specify.

## 2019-01-04 NOTE — Telephone Encounter (Signed)
Why was patient calling?  Thanks

## 2019-01-04 NOTE — Telephone Encounter (Signed)
Patient called requesting a call back today. Please give her a call when you can.

## 2019-01-05 ENCOUNTER — Telehealth: Payer: Self-pay | Admitting: Family Medicine

## 2019-01-05 NOTE — Telephone Encounter (Signed)
Patient states that she has continued to have intermittent nonspecific symptoms.  She spoke with her PCP about this recently.  She has been having intermittent chills.  No fevers.  Has not had a cough.  Did have a brief episode of shortness of breath 2 nights ago but none since.  She works at a Holiday representative at [employer] and was asking if she needed to go to urgent care to get her temperature checked today.  Discussed that given her symptoms are nonsevere that she should self monitor at home.  Advised that she could return to work if she is symptom-free for the next 7 days.  Recommended continued social distancing, good hand hygiene.  She has a note from her PCP that was written yesterday that excuses her from work for 14 days.  All questions were answered and she voiced good understanding of red flags including fever, shortness of breath, cough.

## 2019-01-09 ENCOUNTER — Telehealth: Payer: Self-pay | Admitting: Family Medicine

## 2019-01-09 NOTE — Telephone Encounter (Signed)
Patient called with concern that she had coronavirus.  Stated she had about 10 days of chills and weakness in 7 days of headache, nausea with no vomiting, dizziness, feeling warm at night with mild sweating.  She has not had a cough, until this morning which she stated was mild and nonproductive.  She does not know if she has a fever, does not feel like she has a fever during the day.  Has ordered a thermometer from [employer].  She states she has bad mild shortness of breath at night and some chest pains in the shower, but these are associated with thinking about stressful situations such as recent death of her father's wife from coronavirus in [place].  Patient has not had any contact with his family from [place] since February.  Patient has a history of psychiatric conditions such as anxiety and bipolar disorder.  Patient does not appear to have coronavirus, due to lack of respiratory symptoms.  Possible she has some other viral illness, or it is possible that this is a combination of stress related somatic complaints and menopausal symptoms.  Advised patient to continue to stay at home, as she is been allowed to stay at home from work until next Monday.  If she does not improve in this next week told patient to call the office again and we can reevaluate her and see if she still needs to stay at home from work.

## 2019-01-11 ENCOUNTER — Ambulatory Visit: Payer: Self-pay | Admitting: *Deleted

## 2019-01-11 NOTE — Telephone Encounter (Signed)
Has had intermittent  chills for 10-12 days.  Stated she had trouble breathing and chest tightness last night for approximately 15 minutes that resolved on its own.Patient talked for 20 minutes with no identified shortness of breath or cough at this time. 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 No fever or cough reported. She does not have thermometer to monitor temperature. She will acquire one today. Advised she stay home under quarantine if she feels she has symptoms. Monitor her temperature and breathing.Patient voiced anxiety and stress at this time. Questions regarding how to obtain a test and or chest xray. Again advised her to stay home unless she began to have difficulty breathing, at that time she should call 911 for assistance. Stated she understood.    Reason for Disposition . [1] Caller concerned that exposure to COVID-19 occurred BUT [2] does not meet COVID-19 EXPOSURE criteria from [facility]  Answer Assessment - Initial Assessment Questions 1. 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 2. PLACE of CONTACT: "Where were you when you were exposed to COVID-19?" (e.g., home, school, medical waiting room; which city?)     At work if I was exposed. 3. TYPE of CONTACT: "How much contact was there?" (e.g., sitting next to, live in same house, work in same office, same building)    No known 4. DURATION of CONTACT: "How long were you in contact with the COVID-19 patient?" (e.g., a few seconds, passed by person, a few minutes, live with the patient)      5. DATE of CONTACT: "When did you have contact with a COVID-19 patient?" (e.g., how many days ago)     Last work day was 01/01/19 6. TRAVEL: "Have you traveled out of the country recently?" If so, "When and where?" none     * Also ask about  out-of-state travel, since the CDC has identified some high risk cities for community spread in the [place].     * Note: Travel becomes less relevant if there is widespread community transmission where the patient lives.     no 7. COMMUNITY SPREAD: "Are there lots of cases or COVID-19 (community spread) where you live?" (See public health department website, if unsure)   * MAJOR community spread: high number of cases; numbers of cases are increasing; many people hospitalized.   * MINOR community spread: low number of cases; not increasing; few or no people hospitalized     no 8. SYMPTOMS: "Do you have any symptoms?" (e.g., fever, cough, breathing difficulty)     Stated she had trouble breathing last night for approximately 15 minutes. 9. PREGNANCY OR POSTPARTUM: "Is there any chance you are pregnant?" "When was your last menstrual period?" "Did you deliver in the last 2 weeks?"    Did not ask 10. HIGH RISK: "Do you have any heart or lung problems? Do you have a weak immune system?" (e.g., CHF, COPD, asthma, HIV positive, chemotherapy, renal failure, diabetes mellitus, sickle cell anemia)       None reported.  Protocols used: CORONAVIRUS (COVID-19) EXPOSURE-A-AH

## 2019-01-16 ENCOUNTER — Telehealth (INDEPENDENT_AMBULATORY_CARE_PROVIDER_SITE_OTHER): Payer: Medicare Other | Admitting: Family Medicine

## 2019-01-16 ENCOUNTER — Encounter: Payer: Self-pay | Admitting: Family Medicine

## 2019-01-16 ENCOUNTER — Other Ambulatory Visit: Payer: Self-pay

## 2019-01-16 DIAGNOSIS — R5383 Other fatigue: Secondary | ICD-10-CM | POA: Diagnosis not present

## 2019-01-16 NOTE — Telephone Encounter (Signed)
Chambliss will give pt a call back at [phone].

## 2019-01-16 NOTE — Progress Notes (Signed)
Subjective  Tanya Simpson is a 58 y.o. female is presenting with the following [facility] Telemedicine Visit  Patient consented to have virtual visit. Method of visit: Video was attempted, but technology challenges prevented patient from using video, so visit was conducted via telephone.  Encounter participants: Patient: Tanya Simpson - located at home Provider: Carney Living - located at office Others (if applicable): none  Chief Complaint: fatigue  HPI:  On an off for last few weeks tired with chills and low fevers (by head strip) sometimes tired with chest pain at rest (better with exertion) nonproductive cough and intremittent dizziness.  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   Only mild shortness of breath without rash or leg swelling or new meds  ROS: per HPI  Pertinent PMHx: Bipolar no diabetes or hypertension or lung problems.  She is taking loratadine  Exam:  Respiratory: normal to rapid speech without signs of dyspnea Psych:  Cognition and judgment appear intact. Alert, communicative  and cooperative with normal attention span and concentration. No apparent delusions, illusions, hallucinations   Assessment/Plan:  Fatigue Worsened.  Sounds to be a lingering viral infection.  Not likely cardiac or pneumonia.  Discussed could be covid. Continue symptomatic treatment.  She is under increased stress with recently widowed father in [place] all alone.    Recommend if any worsening shortness of breath or chest pain she should call or go to ER Recommend not work until afebrile for 3 days and steady improvement She will mychart me tomorrow with her progress    Time spent during visit with patient: 17 minutes

## 2019-01-16 NOTE — Assessment & Plan Note (Addendum)
Worsened.  Sounds to be a lingering viral infection.  Not likely cardiac or pneumonia.  Discussed could be covid. Continue symptomatic treatment.  She is under increased stress with recently widowed father in [place] all alone.    Recommend if any worsening shortness of breath or chest pain she should call or go to ER Recommend not work until afebrile for 3 days and steady improvement She will mychart me tomorrow with her progress

## 2019-01-16 NOTE — Telephone Encounter (Signed)
Spoke with patient via phone She felt nauseous earlier today but now better Felt had a small fever this AM Recommend no work until after 3 days of no fever Let us know via [facility] how she is tomorrow

## 2019-01-30 ENCOUNTER — Other Ambulatory Visit: Payer: Self-pay

## 2019-01-30 ENCOUNTER — Telehealth (INDEPENDENT_AMBULATORY_CARE_PROVIDER_SITE_OTHER): Payer: Medicare Other | Admitting: Family Medicine

## 2019-01-30 DIAGNOSIS — R6883 Chills (without fever): Secondary | ICD-10-CM

## 2019-01-30 DIAGNOSIS — R079 Chest pain, unspecified: Secondary | ICD-10-CM | POA: Diagnosis not present

## 2019-01-30 NOTE — Progress Notes (Signed)
[facility] Telemedicine Visit  Patient consented to have virtual visit. Method of visit: Video was attempted, but technology challenges prevented patient from using video, so visit was conducted via telephone.  Encounter participants: Patient: Tanya Simpson - located at Home Provider: Janit Pagan - located at Office Others (if applicable): NA  Chief Complaint: Chills and Chest pain  HPI:  Fever   This is a new problem. Episode onset: 3 weeks ago. Episode frequency: Mostly in the morning and night. The problem has been waxing and waning. Her temperature was unmeasured (Chills and feels warm at night) prior to arrival. Associated symptoms include chest pain, headaches and nausea. Pertinent negatives include no coughing or vomiting. Associated symptoms comments: Stress at worker, she works as a Holiday representative at [employer]. She has tried acetaminophen for the symptoms. The treatment provided moderate relief.  Risk factors: no sick contacts   Risk factors comment:  Unknown if she has contact. She works as a Holiday representative. In February she went to see her step mother who was dying on a ventilator Chest Pain   This is a new problem. The current episode started 1 to 4 weeks ago (started 3 weeks ago on and off). The onset quality is gradual. The problem occurs intermittently. The problem has been waxing and waning (Mostly at night time). The pain is present in the substernal region. Pain scale: Just discomfort that makes her nervous. She can't rate it. The pain is mild. The quality of the pain is described as tightness and squeezing. The pain does not radiate. Associated symptoms include dizziness, a fever, headaches, nausea and shortness of breath. Pertinent negatives include no cough, orthopnea, palpitations or vomiting. The pain is aggravated by emotional upset (Stress at work, also recently loss her step mom and her dad fell and broke his hip, now he is in the hospital). She has tried  acetaminophen for the symptoms. The treatment provided mild relief. Risk factors include stress (She worked long hours yesterday that made her symptoms worse. She feels better now).  Her past medical history is significant for anxiety/panic attacks.     ROS: per HPI  Pertinent PMHx: Problem list reviewed  Exam:  Respiratory: No respiratory distress  Assessment/Plan:  Chills Mild to moderate risk for COVID-19. Some element of anxiety and stress here as well. Currently asymptomatic. Advise to self monitor at home if having no symptoms and may return to work after a week or 72 hours after symptoms resolution. However, if having chest pain or SOB, ED evaluation recommended. She will try to go to the ED today or tomorrow depending on how she feels.  Atypical Chest pain Currently asymptomatic. ED evaluation recommended if she continues to have chest pain. She will go in to be evaluated today.  Continue wearing facemask and social distancing as much as possible to prevent COVID-19 exposure and spread. She agreed with the plan.  Time spent during visit with patient: 25 minutes

## 2019-01-31 ENCOUNTER — Encounter (HOSPITAL_COMMUNITY): Payer: Self-pay

## 2019-01-31 ENCOUNTER — Emergency Department (HOSPITAL_COMMUNITY)
Admission: EM | Admit: 2019-01-31 | Discharge: 2019-01-31 | Disposition: A | Discharge status: Home / Self Care | Payer: Medicare Other | Attending: Emergency Medicine | Admitting: Emergency Medicine

## 2019-01-31 ENCOUNTER — Emergency Department (HOSPITAL_COMMUNITY): Payer: Medicare Other

## 2019-01-31 ENCOUNTER — Other Ambulatory Visit: Payer: Self-pay

## 2019-01-31 DIAGNOSIS — Z87891 Personal history of nicotine dependence: Secondary | ICD-10-CM | POA: Insufficient documentation

## 2019-01-31 DIAGNOSIS — Z20828 Contact with and (suspected) exposure to other viral communicable diseases: Secondary | ICD-10-CM | POA: Diagnosis not present

## 2019-01-31 DIAGNOSIS — R05 Cough: Secondary | ICD-10-CM | POA: Diagnosis not present

## 2019-01-31 DIAGNOSIS — R0789 Other chest pain: Secondary | ICD-10-CM | POA: Diagnosis not present

## 2019-01-31 DIAGNOSIS — R079 Chest pain, unspecified: Secondary | ICD-10-CM | POA: Diagnosis not present

## 2019-01-31 DIAGNOSIS — Z79899 Other long term (current) drug therapy: Secondary | ICD-10-CM | POA: Insufficient documentation

## 2019-01-31 LAB — CBC WITH DIFFERENTIAL/PLATELET
Abs Immature Granulocytes: 0.04 10*3/uL (ref 0.00–0.07)
Basophils Absolute: 0 10*3/uL (ref 0.0–0.1)
Basophils Relative: 0 %
Eosinophils Absolute: 0.2 10*3/uL (ref 0.0–0.5)
Eosinophils Relative: 3 %
HCT: 39.5 % (ref 36.0–46.0)
Hemoglobin: 13.1 g/dL (ref 12.0–15.0)
Immature Granulocytes: 1 %
Lymphocytes Relative: 21 %
Lymphs Abs: 1.8 10*3/uL (ref 0.7–4.0)
MCH: 31.1 pg (ref 26.0–34.0)
MCHC: 33.2 g/dL (ref 30.0–36.0)
MCV: 93.8 fL (ref 80.0–100.0)
Monocytes Absolute: 0.6 10*3/uL (ref 0.1–1.0)
Monocytes Relative: 7 %
Neutro Abs: 5.7 10*3/uL (ref 1.7–7.7)
Neutrophils Relative %: 68 %
Platelets: 335 10*3/uL (ref 150–400)
RBC: 4.21 MIL/uL (ref 3.87–5.11)
RDW: 12.4 % (ref 11.5–15.5)
WBC: 8.3 10*3/uL (ref 4.0–10.5)
nRBC: 0 % (ref 0.0–0.2)

## 2019-01-31 LAB — COMPREHENSIVE METABOLIC PANEL
ALT: 20 U/L (ref 0–44)
AST: 17 U/L (ref 15–41)
Albumin: 4 g/dL (ref 3.5–5.0)
Alkaline Phosphatase: 64 U/L (ref 38–126)
Anion gap: 9 (ref 5–15)
BUN: 8 mg/dL (ref 6–20)
CO2: 28 mmol/L (ref 22–32)
Calcium: 9.3 mg/dL (ref 8.9–10.3)
Chloride: 99 mmol/L (ref 98–111)
Creatinine, Ser: 0.67 mg/dL (ref 0.44–1.00)
GFR calc Af Amer: >60 mL/min (ref >60)
GFR calc non Af Amer: >60 mL/min (ref >60)
Glucose, Bld: 80 mg/dL (ref 70–99)
Potassium: 4.6 mmol/L (ref 3.5–5.1)
Sodium: 136 mmol/L (ref 135–145)
Total Bilirubin: 0.5 mg/dL (ref 0.3–1.2)
Total Protein: 7.1 g/dL (ref 6.5–8.1)

## 2019-01-31 LAB — I-STAT BETA HCG BLOOD, ED (MC, WL, AP ONLY): I-stat hCG, quantitative: <5 m[IU]/mL (ref <5)

## 2019-01-31 LAB — TROPONIN I: Troponin I: <0.03 ng/mL (ref <0.03)

## 2019-01-31 LAB — BRAIN NATRIURETIC PEPTIDE: B Natriuretic Peptide: 30.8 pg/mL (ref 0.0–100.0)

## 2019-01-31 LAB — MAGNESIUM: Magnesium: 1.9 mg/dL (ref 1.7–2.4)

## 2019-01-31 LAB — CBG MONITORING, ED: Glucose-Capillary: 69 mg/dL — ABNORMAL LOW (ref 70–99)

## 2019-01-31 MED ORDER — ASPIRIN 81 MG PO CHEW
324.0000 mg | CHEWABLE_TABLET | Freq: Once | ORAL | Status: AC
  Administered 2019-01-31: 324 mg via ORAL
  Filled 2019-01-31: qty 4

## 2019-01-31 NOTE — ED Notes (Signed)
Radiology at bedside for CXR

## 2019-01-31 NOTE — ED Provider Notes (Signed)
[facility] EMERGENCY DEPARTMENT Provider Note   CSN: [id number] Arrival date & time: 01/31/19  1052    History   Chief Complaint Chief Complaint  Patient presents with  . Chest Pain    HPI Tanya Simpson is a 58 y.o. female.     HPI  Patient presents with multiple complaints, and with specific request for coronavirus testing. She notes that she is generally well, denies chronic medical problems, however over the past 3 weeks she has been normal. She presents with concern of fever, chills, chest pain, anxiousness. She notes that around the time of onset of symptoms she had several restful family events, including death of a family member, and her father breaking his hip. She herself has had no notable changes personally, however. Patient works in a Hospital doctor. Patient states she is having difficulty wearing a mask secondary to her symptoms, as above. Since onset no clear alleviating or exacerbating factors.   Past Medical History:  Diagnosis Date  . Allergic rhinitis   . Anxiety   . Depression     Patient Active Problem List   Diagnosis Date Noted  . Healthcare maintenance 10/12/2017  . Fatigue 10/12/2017  . Bipolar disorder [facility])     Past Surgical History:  Procedure Laterality Date  . TONSILLECTOMY       OB History   No obstetric history on file.      Home Medications    Prior to Admission medications   Medication Sig Start Date End Date Taking? Authorizing Provider  acetaminophen (TYLENOL) 325 MG tablet Take 650 mg by mouth every 6 (six) hours as needed for mild pain.   Yes [provider]  benztropine (COGENTIN) 1 MG tablet Take 1 tablet (1 mg total) by mouth daily. 09/05/18 01/31/19 Yes Corie Chiquito, PMHNP  haloperidol (HALDOL) 5 MG tablet Take 1 tab po q am and 2 tabs po QHS Patient taking differently: Take 5-10 mg by mouth 2 (two) times daily. Take 1 tab 5mg  q am and 2 tabs 10mg  QHS 09/05/18  Yes Corie Chiquito, PMHNP   lamoTRIgine (LAMICTAL) 100 MG tablet Take 1 tablet (100 mg total) by mouth daily. Patient taking differently: Take 100 mg by mouth at bedtime.  09/05/18  Yes Corie Chiquito, PMHNP  loratadine (CLARITIN) 10 MG tablet Take 1 tablet (10 mg total) by mouth daily. 10/11/17 01/31/19 Yes [name], [name], DO  Multiple Vitamin (MULTIVITAMIN) tablet Take 1 tablet by mouth every other day.   Yes [provider]    Family History Family History  Problem Relation Age of Onset  . Hypertension Mother   . Impulse control disorder Mother   . CVA Father   . Depression Father     Social History Social History   Tobacco Use  . Smoking status: Former Smoker    Packs/day: 1.00    Years: 1.00    Pack years: 1.00  . Smokeless tobacco: Never Used  Substance Use Topics  . Alcohol use: Yes    Alcohol/week: 1.0 standard drinks    Types: 1 Glasses of wine per week    Comment: very little  . Drug use: No     Allergies   Patient has no known allergies.   Review of Systems Review of Systems  Constitutional:       Per HPI, otherwise negative  HENT:       Per HPI, otherwise negative  Respiratory:       Per HPI, otherwise negative  Cardiovascular:  Per HPI, otherwise negative  Gastrointestinal: Negative for vomiting.  Endocrine:       Negative aside from HPI  Genitourinary:       Neg aside from HPI   Musculoskeletal:       Per HPI, otherwise negative  Skin: Negative.   Neurological: Negative for syncope.  Psychiatric/Behavioral: The patient is nervous/anxious.      Physical Exam Updated Vital Signs BP 130/75 (BP Location: Right Arm)   Pulse 79   Temp 99 F (37.2 C) (Oral)   Resp 16   Ht  (1.575 m)   Wt 65.8 kg   SpO2 100%   BMI 26.52 kg/m   Physical Exam Vitals signs and nursing note reviewed.  Constitutional:      General: She is not in acute distress.    Appearance: She is well-developed.  HENT:     Head: Normocephalic and atraumatic.  Eyes:      Conjunctiva/sclera: Conjunctivae normal.  Cardiovascular:     Rate and Rhythm: Normal rate and regular rhythm.  Pulmonary:     Effort: Pulmonary effort is normal. No respiratory distress.     Breath sounds: Normal breath sounds. No stridor.  Abdominal:     General: There is no distension.  Skin:    General: Skin is warm and dry.  Neurological:     Mental Status: She is alert and oriented to person, place, and time.     Cranial Nerves: No cranial nerve deficit.  Psychiatric:        Mood and Affect: Mood is anxious.      ED Treatments / Results  Labs (all labs ordered are listed, but only abnormal results are displayed) Labs Reviewed  CBG MONITORING, ED - Abnormal; Notable for the following components:      Result Value   Glucose-Capillary 69 (*)    All other components within normal limits  COMPREHENSIVE METABOLIC PANEL  MAGNESIUM  TROPONIN I  BRAIN NATRIURETIC PEPTIDE  CBC WITH DIFFERENTIAL/PLATELET  I-STAT BETA HCG BLOOD, ED ([facility], [facility], [facility] ONLY)    EKG Rhythm, rate 80, slight artifact, otherwise unremarkable   Radiology Dg Chest Portable 1 View  Result Date: 01/31/2019 CLINICAL DATA:  Chest pain cough EXAM: PORTABLE CHEST 1 VIEW COMPARISON:  None. FINDINGS: The heart size and mediastinal contours are within normal limits. Both lungs are clear. The visualized skeletal structures are unremarkable. IMPRESSION: No active disease. Electronically Signed   By: Marlan Palau M.D.   On: 01/31/2019 11:55    Procedures Procedures (including critical care time)  Medications Ordered in ED Medications  aspirin chewable tablet 324 mg (324 mg Oral Given 01/31/19 1200)     Initial Impression / Assessment and Plan / ED Course  I have reviewed the triage vital signs and the nursing notes.  Pertinent labs & imaging results that were available during my care of the patient were reviewed by me and considered in my medical decision making (see chart for details).        1:09 PM  Patient in no distress, awake and alert. He is aware of all findings including reassuring x-ray, blood work, including no leukopenia, no abnormal x-ray, no hypoxia, no clinical evidence of occult infection. However, patient does have this ongoing concern, was instructed on home care instructions, monitoring, return precautions. Patient's findings are otherwise reassuring for absence of ACS, pneumonia, or other acute findings as well.   Tanya Simpson was evaluated in Emergency Department on 01/31/2019 for the symptoms described in  the history of present illness. She was evaluated in the context of the global COVID-19 pandemic, which necessitated consideration that the patient might be at risk for infection with the SARS-CoV-2 virus that causes COVID-19. Institutional protocols and algorithms that pertain to the evaluation of patients at risk for COVID-19 are in a state of rapid change based on information released by regulatory bodies including the CDC and federal and state organizations. These policies and algorithms were followed during the patient's care in the ED.   Final Clinical Impressions(s) / ED Diagnoses  Atypical chest pain   Gerhard MunchLockwood, Anikah Hogge, MD 01/31/19 1310

## 2019-01-31 NOTE — Discharge Instructions (Addendum)
As discussed, your evaluation today has been largely reassuring.  But, it is important that you monitor your condition carefully, and do not hesitate to return to the ED if you develop new, or concerning changes in your condition. ? ?Otherwise, please follow-up with your physician for appropriate ongoing care. ? ?

## 2019-01-31 NOTE — ED Triage Notes (Signed)
Pt reports chest discomfort on and off for 3 weeks.  Pt reports nausea, dizziness and SOB as well for 2-3 weeks.  Pt has hx of depression and anxiety and is on disability for depression.  Pt denies cardiac hx.  Denies fever however c/o chills.

## 2019-01-31 NOTE — ED Notes (Signed)
Walked patient to the bathroom  

## 2019-01-31 NOTE — ED Notes (Signed)
[phone] Tanya Simpson husband

## 2019-02-09 ENCOUNTER — Telehealth: Payer: Self-pay | Admitting: Psychiatry

## 2019-02-09 NOTE — Telephone Encounter (Signed)
Tried to reach pt she was out walking the dog, husband said to call back in about 15 minutes.

## 2019-02-09 NOTE — Telephone Encounter (Signed)
Terrie called wanting to discuss approval for early refills of medication.  She is having to travel up [place] and will be gone for several weeks and doesn't want to run out of meds. While there.  So wants to be able to get them early so she can take them with her.  Please call to discuss exactly what she needs.  [phone])  If no answer can try mobile # [phone]

## 2019-02-09 NOTE — Telephone Encounter (Signed)
Pt states she's leaving in 2 weeks and we be gone for 2 weeks asking ahead of time to make sure she can get all 3 rx's lamictal, cogention, and haldol filled and have enough to take.  Explained that shouldn't be a problem we could ok that she just may have an issue with insurance if it's too early to fill. Will contact [facility] and discuss with them. Instructed to set up an appt with provider when available as well.

## 2019-02-15 NOTE — Telephone Encounter (Signed)
Tried to reach someone at [facility] pharmacy to discuss but no one answered, will try back later. Due to altered hours of operation.

## 2019-02-16 NOTE — Telephone Encounter (Signed)
Spoke with pharmacist, she does have refills available on all 3 rx's, last filled lamictal and cogentin 04/23, ok early refill on those if needs prior to leaving. Last fill on haldol was 05/08 should be ok with that one. Pharmacist said she could also get all 3 transferred to a [facility] where she is traveling to if insurance doesn't cover at time she is requesting. Nothing is controlled so shouldn't be a problem.

## 2019-02-19 ENCOUNTER — Telehealth: Payer: Self-pay | Admitting: Psychiatry

## 2019-02-19 NOTE — Telephone Encounter (Signed)
Has refills available through June, spoke to pharmacist last week. They instructed it would be easier to have her transfer refills to [facility] there or send directly to them. Due for refill this week per pharmacist prior to her leaving town.

## 2019-02-19 NOTE — Telephone Encounter (Signed)
Pt called to advise dad broke hip and she's POA. He lives up [place] and will be going there 5/26. May be  gone up to 3 mos. Asking for refill to be at Pharmacy in case [place] of [place] for Haldol 5 mg  #90 3/d. @ [facility] .She has enough for June. Was going to schedule appt before. Nothing available. Will schedule after return.

## 2019-02-20 ENCOUNTER — Ambulatory Visit: Payer: Medicare Other | Admitting: Psychiatry

## 2019-03-17 ENCOUNTER — Other Ambulatory Visit: Payer: Self-pay | Admitting: Psychiatry

## 2019-03-17 DIAGNOSIS — F39 Unspecified mood [affective] disorder: Secondary | ICD-10-CM

## 2019-04-18 ENCOUNTER — Encounter: Payer: Self-pay | Admitting: Psychiatry

## 2019-04-18 ENCOUNTER — Ambulatory Visit (INDEPENDENT_AMBULATORY_CARE_PROVIDER_SITE_OTHER): Payer: Medicare Other | Admitting: Psychiatry

## 2019-04-18 ENCOUNTER — Other Ambulatory Visit: Payer: Self-pay

## 2019-04-18 DIAGNOSIS — G2401 Drug induced subacute dyskinesia: Secondary | ICD-10-CM

## 2019-04-18 DIAGNOSIS — F39 Unspecified mood [affective] disorder: Secondary | ICD-10-CM

## 2019-04-18 MED ORDER — BENZTROPINE MESYLATE 1 MG PO TABS: 1.0000 mg | ORAL_TABLET | Freq: Every day | ORAL | 6 refills | Status: DC

## 2019-04-18 MED ORDER — HALOPERIDOL 5 MG PO TABS: 15.0000 mg | ORAL_TABLET | Freq: Every day | ORAL | 5 refills | Status: DC

## 2019-04-18 MED ORDER — LAMOTRIGINE 100 MG PO TABS: 100.0000 mg | ORAL_TABLET | Freq: Every day | ORAL | 5 refills | Status: DC

## 2019-04-18 NOTE — Progress Notes (Signed)
Tanya Simpson [id number] 22-Jul-1961 58 y.o.  Subjective:   Patient ID:  Tanya Simpson is a 58 y.o. (DOB 1961-06-15) female.  Chief Complaint:  Chief Complaint  Patient presents with  . Follow-up    Medication Management  . Other    Bipolar    HPI Tanya Simpson presents to the office today for follow-up of mood and h/o psychosis. She reports that her mood has been stable for the most part. She reports that she recently had a day of depression and anxiety when she was feeling lonely. She reports that afterwards she slept well and felt better the next day. She reports that this may have been triggered by awkward conversation with her son. She reports that she has been feeling "bored." Reports episodes of anxiety are infrequent. Denies any recent elevated moods and denies impulsivity. Reports that she is managing father's finances and making sure finances go to father's needs.   She reports that her step-mother died and then father was living alone, fell, and is now in a nursing home. She reports that she had to go to [place] to help with arrangements to place father and sell his home. Reports that she has not been able to go back to make other arrangements due to pandemic restrictions in [place].   She reports that she notices increased appetite after taking Haldol in the morning and asks if she can take Haldol at bedtime only or stopping morning dose. She reports that she had intentionally lost weight with [employer] prior to the pandemic. She reports adequate sleep and sometimes up to 9-10 hours. She reports that her energy has been low throughout the day. Motivation is adequate. She reports that concentration is fair and would like to have more to focus on. Denies SI. She reports having a fear of dying.   Denies AH or VH. Denies paranoia.   She reports that she continues to work at [employer] 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   She reports that she has ordered some furniture and is excited about this. Enjoying reading her Bible.   She reports that she has been seeing a couples Social worker.   Past Psychiatric Medication Trials: Depakote Lamictal Haldol Olanzapine Risperdal Seroquel Lithium  Review of Systems:  Review of Systems  Constitutional: Positive for fatigue.  Musculoskeletal: Negative for gait problem.  Neurological: Negative for tremors.  Psychiatric/Behavioral:       Please refer to HPI    Medications: I have reviewed the patient's current medications.  Current Outpatient Medications  Medication Sig Dispense Refill  . acetaminophen (TYLENOL) 325 MG tablet Take 650 mg by mouth every 6 (six) hours as needed for mild pain.    . benztropine (COGENTIN) 1 MG tablet Take 1 tablet (1 mg total) by mouth daily. 30 tablet 6  . haloperidol (HALDOL) 5 MG tablet Take 3 tablets (15 mg total) by mouth at bedtime. 90 tablet 5  . lamoTRIgine (LAMICTAL) 100 MG tablet Take 1 tablet (100 mg total) by mouth at bedtime. 30 tablet 5  . loratadine (CLARITIN) 10 MG tablet Take 1 tablet (10 mg total) by mouth daily. 90 tablet 2  . Multiple Vitamin (MULTIVITAMIN) tablet Take 1 tablet by mouth every other day.     No current facility-administered medications for this visit.     Medication Side Effects: Fatigue and Other: Possible increased appetite  Allergies: No Known Allergies  Past Medical History:  Diagnosis Date  . Allergic rhinitis   . Anxiety   . Depression     Family History  Problem Relation Age of Onset  . Hypertension Mother   . Impulse control disorder Mother   . CVA Father   . Depression Father     Social History   Socioeconomic History  . Marital status: Married    Spouse name: Onalee HuaDavid  . Number of children: Not on file  . Years of education: Not on file  . Highest education level: Not on file  Occupational History  . Occupation: [employer]  Social Needs  .  Financial resource strain: Not on file  . Food insecurity    Worry: Not on file    Inability: Not on file  . Transportation needs    Medical: Not on file    Non-medical: Not on file  Tobacco Use  . Smoking status: Former Smoker    Packs/day: 1.00    Years: 1.00    Pack years: 1.00  . Smokeless tobacco: Never Used  Substance and Sexual Activity  . Alcohol use: Yes    Alcohol/week: 1.0 standard drinks    Types: 1 Glasses of wine per week    Comment: very little  . Drug use: No  . Sexual activity: Yes    Partners: Male  Lifestyle  . Physical activity    Days per week: 5 days    Minutes per session: 30 min  . Stress: Not on file  Relationships  . Social Musicianconnections    Talks on phone: Not on file    Gets together: Not on file    Attends religious service: Not on file    Active member of club or organization: Not on file    Attends meetings of clubs or organizations: Not on file    Relationship status: Not on file  . Intimate partner violence    Fear of current or ex partner: Not on file    Emotionally abused: Not on file    Physically abused: Not on file    Forced sexual activity: Not on file  Other Topics Concern  . Not on file  Social History Narrative  . Not on file    Past Medical History, Surgical history, Social history, and Family history were reviewed and updated as appropriate.   Please see review of systems for further details on the patient's review from today.   Objective:   Physical Exam:  BP (!) 145/81   Pulse 85   Wt 145 lb (65.8 kg)   BMI 26.52 kg/m   Physical Exam Constitutional:      General: She is not in acute distress.    Appearance: She is well-developed.  Musculoskeletal:        General: No deformity.  Neurological:     Mental Status: She is alert and oriented to person, place, and time.     Coordination: Coordination normal.  Psychiatric:        Attention and Perception: Attention and perception normal. She does not perceive  auditory or visual hallucinations.        Mood and Affect: Mood is anxious. Mood is not depressed. Affect is not labile, blunt, angry or inappropriate.        Speech: Speech is rapid and pressured.        Behavior: Behavior normal.        Thought Content: Thought content normal. Thought content is not paranoid. Thought content does not  include homicidal or suicidal ideation. Thought content does not include homicidal or suicidal plan.        Cognition and Memory: Cognition and memory normal.        Judgment: Judgment normal.     Comments: Insight intact. No delusions.  Speech consistent with baseline     Lab Review:     Component Value Date/Time   NA 136 01/31/2019 1128   K 4.6 01/31/2019 1128   CL 99 01/31/2019 1128   CO2 28 01/31/2019 1128   GLUCOSE 80 01/31/2019 1128   BUN 8 01/31/2019 1128   CREATININE 0.67 01/31/2019 1128   CALCIUM 9.3 01/31/2019 1128   PROT 7.1 01/31/2019 1128   ALBUMIN 4.0 01/31/2019 1128   AST 17 01/31/2019 1128   ALT 20 01/31/2019 1128   ALKPHOS 64 01/31/2019 1128   BILITOT 0.5 01/31/2019 1128   GFRNONAA >60 01/31/2019 1128   GFRAA >60 01/31/2019 1128       Component Value Date/Time   WBC 8.3 01/31/2019 1128   RBC 4.21 01/31/2019 1128   HGB 13.1 01/31/2019 1128   HGB 12.7 10/11/2017 1422   HCT 39.5 01/31/2019 1128   HCT 38.0 10/11/2017 1422   PLT 335 01/31/2019 1128   PLT 365 10/11/2017 1422   MCV 93.8 01/31/2019 1128   MCV 93 10/11/2017 1422   MCH 31.1 01/31/2019 1128   MCHC 33.2 01/31/2019 1128   RDW 12.4 01/31/2019 1128   RDW 12.8 10/11/2017 1422   LYMPHSABS 1.8 01/31/2019 1128   MONOABS 0.6 01/31/2019 1128   EOSABS 0.2 01/31/2019 1128   BASOSABS 0.0 01/31/2019 1128    No results found for: POCLITH, LITHIUM   No results found for: PHENYTOIN, PHENOBARB, VALPROATE, CBMZ   .res Assessment: Plan:   She reports that she is having increased appetite after taking Haldol and ask if she could leave off the morning dose or take all of  Haldol at at bedtime.  Recommend changing Haldol to 15 mg at bedtime to possibly minimize fatigue and increased appetite during the day and discussed trying to continue same total daily dose since patient has been stable on this dosage. Will continue lamotrigine and Cogentin as prescribed. Recommend continuing to see couples counselor. Patient to follow-up in 6 months or sooner if clinically indicated. Patient advised to contact office with any questions, adverse effects, or acute worsening in signs and symptoms.  Tanya Simpson was seen today for follow-up and other.  Diagnoses and all orders for this visit:  Mood disorder (HCC) Comments: Stable Orders: -     haloperidol (HALDOL) 5 MG tablet; Take 3 tablets (15 mg total) by mouth at bedtime. -     lamoTRIgine (LAMICTAL) 100 MG tablet; Take 1 tablet (100 mg total) by mouth at bedtime.  Tardive dyskinesia Comments: Stable Orders: -     benztropine (COGENTIN) 1 MG tablet; Take 1 tablet (1 mg total) by mouth daily.     Please see After Visit Summary for patient specific instructions.  No future appointments.  No orders of the defined types were placed in this encounter.   -------------------------------

## 2019-04-18 NOTE — Progress Notes (Signed)
   04/18/19 1236  Facial and Oral Movements  Muscles of Facial Expression 0  Lips and Perioral Area 0  Jaw 0  Tongue 0  Extremity Movements  Upper (arms, wrists, hands, fingers) 0  Lower (legs, knees, ankles, toes) 0  Trunk Movements  Neck, shoulders, hips 0  Overall Severity  Severity of abnormal movements (highest score from questions above) 0  Incapacitation due to abnormal movements 0  Patient's awareness of abnormal movements (rate only patient's report) 0  Dental Status  Current problems with teeth and/or dentures? No  Does patient usually wear dentures? No  AIMS Total Score  AIMS Total Score 0

## 2019-05-11 ENCOUNTER — Telehealth: Payer: Self-pay | Admitting: Psychiatry

## 2019-05-11 NOTE — Telephone Encounter (Signed)
Pt called having issues with meds. Feeling weak inside. Tried to take meds at bed time. Continues to have issues. Please advise

## 2019-05-11 NOTE — Telephone Encounter (Signed)
Given patient instructions, she said she only takes cogentin 1 mg daily, she didn't do bid said because of side effects. Instructed her to continue daily and to call back with any questions or concerns.

## 2019-05-16 DIAGNOSIS — F3112 Bipolar disorder, current episode manic without psychotic features, moderate: Secondary | ICD-10-CM | POA: Diagnosis not present

## 2019-05-30 ENCOUNTER — Ambulatory Visit (INDEPENDENT_AMBULATORY_CARE_PROVIDER_SITE_OTHER): Payer: Medicare Other

## 2019-05-30 ENCOUNTER — Other Ambulatory Visit: Payer: Self-pay

## 2019-05-30 VITALS — BP 108/70 | HR 65 | Temp 98.4°F | Ht 63.0 in | Wt 146.0 lb

## 2019-05-30 DIAGNOSIS — Z0000 Encounter for general adult medical examination without abnormal findings: Secondary | ICD-10-CM | POA: Diagnosis not present

## 2019-05-30 NOTE — Progress Notes (Signed)
I have reviewed this visit and agree with the documentation.  Yida Hyams, DO PGY-3, [facility]   

## 2019-05-30 NOTE — Patient Instructions (Addendum)
You spoke to Tanya Simpson, Tanya Simpson for your annual wellness visit.  We discussed goals: Goals    . Exercise 3x per week (30 min per time)     Walking       We also discussed recommended health maintenance. Please call our office and schedule a visit. As discussed, you are due for the following. Please schedule a visit with Tanya Simpson to have labs drawn and referrals. Plan to get your flu vaccine at your husbands job.  Health Maintenance  Topic Date Due  . Hepatitis C Screening  03-18-1961  . HIV Screening  11/21/1974  . TETANUS/TDAP  11/21/1978  . PAP SMEAR-Modifier  11/21/1980  . MAMMOGRAM  11/21/2009  . COLONOSCOPY  11/21/2009  . INFLUENZA VACCINE  01/02/2020 (Originally 05/05/2019)    Start back walking, as the weather will be getting cooler in the coming months.   Preventive Care 2-59 Years Old, Female Preventive care refers to visits with your health care provider and lifestyle choices that can promote health and wellness. This includes:  A yearly physical exam. This may also be called an annual well check.  Regular dental visits and eye exams.  Immunizations.  Screening for certain conditions.  Healthy lifestyle choices, such as eating a healthy diet, getting regular exercise, not using drugs or products that contain nicotine and tobacco, and limiting alcohol use. What can I expect for my preventive care visit? Physical exam Your health care provider will check your:  Height and weight. This may be used to calculate body mass index (BMI), which tells if you are at a healthy weight.  Heart rate and blood pressure.  Skin for abnormal spots. Counseling Your health care provider may ask you questions about your:  Alcohol, tobacco, and drug use.  Emotional well-being.  Home and relationship well-being.  Sexual activity.  Eating habits.  Work and work Statistician.  Method of birth control.  Menstrual cycle.  Pregnancy history. What immunizations do I  need?  Influenza (flu) vaccine  This is recommended every year. Tetanus, diphtheria, and pertussis (Tdap) vaccine  You may need a Td booster every 10 years. Varicella (chickenpox) vaccine  You may need this if you have not been vaccinated. Zoster (shingles) vaccine  You may need this after age 36. Measles, mumps, and rubella (MMR) vaccine  You may need at least one dose of MMR if you were born in 1957 or later. You may also need a second dose. Pneumococcal conjugate (PCV13) vaccine  You may need this if you have certain conditions and were not previously vaccinated. Pneumococcal polysaccharide (PPSV23) vaccine  You may need one or two doses if you smoke cigarettes or if you have certain conditions. Meningococcal conjugate (MenACWY) vaccine  You may need this if you have certain conditions. Hepatitis A vaccine  You may need this if you have certain conditions or if you travel or work in places where you may be exposed to hepatitis A. Hepatitis B vaccine  You may need this if you have certain conditions or if you travel or work in places where you may be exposed to hepatitis B. Haemophilus influenzae type b (Hib) vaccine  You may need this if you have certain conditions. Human papillomavirus (HPV) vaccine  If recommended by your health care provider, you may need three doses over 6 months. You may receive vaccines as individual doses or as more than one vaccine together in one shot (combination vaccines). Talk with your health care provider about the risks and  benefits of combination vaccines. What tests do I need? Blood tests  Lipid and cholesterol levels. These may be checked every 5 years, or more frequently if you are over 64 years old.  Hepatitis C test.  Hepatitis B test. Screening  Lung cancer screening. You may have this screening every year starting at age 59 if you have a 30-pack-year history of smoking and currently smoke or have quit within the past 15  years.  Colorectal cancer screening. All adults should have this screening starting at age 59 and continuing until age 40. Your health care provider may recommend screening at age 21 if you are at increased risk. You will have tests every 1-10 years, depending on your results and the type of screening test.  Diabetes screening. This is done by checking your blood sugar (glucose) after you have not eaten for a while (fasting). You may have this done every 1-3 years.  Mammogram. This may be done every 1-2 years. Talk with your health care provider about when you should start having regular mammograms. This may depend on whether you have a family history of breast cancer.  BRCA-related cancer screening. This may be done if you have a family history of breast, ovarian, tubal, or peritoneal cancers.  Pelvic exam and Pap test. This may be done every 3 years starting at age 59. Starting at age 17, this may be done every 5 years if you have a Pap test in combination with an HPV test. Other tests  Sexually transmitted disease (STD) testing.  Bone density scan. This is done to screen for osteoporosis. You may have this scan if you are at high risk for osteoporosis. Follow these instructions at home: Eating and drinking  Eat a diet that includes fresh fruits and vegetables, whole grains, lean protein, and low-fat dairy.  Take vitamin and mineral supplements as recommended by your health care provider.  Do not drink alcohol if: ? Your health care provider tells you not to drink. ? You are pregnant, may be pregnant, or are planning to become pregnant.  If you drink alcohol: ? Limit how much you have to 0-1 drink a day. ? Be aware of how much alcohol is in your drink. In the [place] one drink equals one 12 oz bottle of beer (355 mL), one 5 oz glass of wine (148 mL), or one 1 oz glass of hard liquor (44 mL). Lifestyle  Take daily care of your teeth and gums.  Stay active. Exercise for at least 30  minutes on 5 or more days each week.  Do not use any products that contain nicotine or tobacco, such as cigarettes, e-cigarettes, and chewing tobacco. If you need help quitting, ask your health care provider.  If you are sexually active, practice safe sex. Use a condom or other form of birth control (contraception) in order to prevent pregnancy and STIs (sexually transmitted infections).  If told by your health care provider, take low-dose aspirin daily starting at age 43. What's next?  Visit your health care provider once a year for a well check visit.  Ask your health care provider how often you should have your eyes and teeth checked.  Stay up to date on all vaccines. This information is not intended to replace advice given to you by your health care provider. Make sure you discuss any questions you have with your health care provider. Document Released: 10/17/2015 Document Revised: 06/01/2018 Document Reviewed: 06/01/2018 Elsevier Patient Education  2020 [place]  clinic's number is [phone]. Please call with questions or concerns about what we discussed today.

## 2019-05-30 NOTE — Progress Notes (Signed)
Subjective:   Tanya Simpson is a 58 y.o. female who presents for Medicare Annual preventive examination.  Review of Systems: Defer to PCP  Objective:    Vitals: BP 108/70   Pulse 65   Temp 98.4 F (36.9 C) (Oral)   Ht 5\' 3"  (1.6 m)   Wt 146 lb (66.2 kg)   SpO2 98%   BMI 25.86 kg/m   Body mass index is 25.86 kg/m.  Advanced Directives 05/30/2019 01/31/2019 12/19/2018 10/11/2017  Does Patient Have a Medical Advance Directive? No No No No  Would patient like information on creating a medical advance directive? No - Patient declined No - Patient declined No - Patient declined No - Patient declined    Tobacco Social History   Tobacco Use  Smoking Status Former Smoker  . Packs/day: 1.00  . Years: 1.00  . Pack years: 1.00  Smokeless Tobacco Never Used      Clinical Intake:  Pre-visit preparation completed: Yes  Pain Score: 0-No pain   How often do you need to have someone help you when you read instructions, pamphlets, or other written materials from your doctor or pharmacy?: 1 - Never What is the last grade level you completed in school?: bachelors  Interpreter Needed?: No  Past Medical History:  Diagnosis Date  . Allergic rhinitis   . Anxiety   . Depression    Past Surgical History:  Procedure Laterality Date  . TONSILLECTOMY     Family History  Problem Relation Age of Onset  . Hypertension Mother   . Impulse control disorder Mother   . CVA Father   . Depression Father    Social History   Socioeconomic History  . Marital status: Married    Spouse name: Tanya Simpson  . Number of children: 2  . Years of education: 16  . Highest education level: Bachelor's degree (e.g., BA, AB, BS)  Occupational History  . Occupation: [employer]  Social Needs  . Financial resource strain: Not hard at all  . Food insecurity    Worry: Sometimes true    Inability: Sometimes true  . Transportation needs    Medical: No    Non-medical: No  Tobacco Use  . Smoking status:  Former Smoker    Packs/day: 1.00    Years: 1.00    Pack years: 1.00  . Smokeless tobacco: Never Used  Substance and Sexual Activity  . Alcohol use: Yes    Alcohol/week: 1.0 standard drinks    Types: 1 Glasses of wine per week    Comment: very little  . Drug use: No  . Sexual activity: Yes    Partners: Male  Lifestyle  . Physical activity    Days per week: 7 days    Minutes per session: 10 min  . Stress: To some extent  Relationships  . Social Herbalist on phone: Once a week    Gets together: Never    Attends religious service: More than 4 times per year    Active member of club or organization: No    Attends meetings of clubs or organizations: Never    Relationship status: Married  Other Topics Concern  . Not on file  Social History Narrative   Patient lives with her husband here in [place], his name is Tanya Simpson.    Patient works as a Tourist information centre manager at [employer]    Patient is recently from [place].       Hopeful to get back to exercising  once the weather cools down.    She was participating in [employer], however the pandemic has made this difficult for her.     Outpatient Encounter Medications as of 05/30/2019  Medication Sig  . acetaminophen (TYLENOL) 325 MG tablet Take 650 mg by mouth every 6 (six) hours as needed for mild pain.  . haloperidol (HALDOL) 5 MG tablet Take 3 tablets (15 mg total) by mouth at bedtime. (Patient taking differently: Take 5 mg by mouth 2 (two) times daily. )  . loratadine (CLARITIN) 10 MG tablet Take 1 tablet (10 mg total) by mouth daily.  . Multiple Vitamin (MULTIVITAMIN) tablet Take 1 tablet by mouth every other day.  . benztropine (COGENTIN) 1 MG tablet Take 1 tablet (1 mg total) by mouth daily.  Marland Kitchen lamoTRIgine (LAMICTAL) 100 MG tablet Take 1 tablet (100 mg total) by mouth at bedtime.   No facility-administered encounter medications on file as of 05/30/2019.     Activities of Daily Living In your present state of health, do you  have any difficulty performing the following activities: 05/30/2019 05/30/2019  Hearing? N N  Vision? N N  Difficulty concentrating or making decisions? N N  Walking or climbing stairs? N N  Dressing or bathing? N N  Doing errands, shopping? N N  Preparing Food and eating ? N N  Using the Toilet? N N  In the past six months, have you accidently leaked urine? N N  Do you have problems with loss of bowel control? N N  Managing your Medications? N N  Managing your Finances? N N  Housekeeping or managing your Housekeeping? N N  Some recent data might be hidden    Patient Care Team: Tanya Simpson, [name], DO as PCP - General (Family Medicine)    Assessment:   This is a routine wellness examination for Tanya Simpson.  Exercise Activities and Dietary recommendations Current Exercise Habits: The patient does not participate in regular exercise at present, Exercise limited by: None identified  Goals    . Exercise 3x per week (30 min per time)     Walking        Fall Risk Fall Risk  05/30/2019  Falls in the past year? 0   Is the patient's home free of loose throw rugs in walkways, pet beds, electrical cords, etc?  Yes      Grab bars in the bathroom? no      Handrails on the stairs?   no      Adequate lighting?   no  Patient rating of health (0-10) scale: 7  Depression Screen PHQ 2/9 Scores 05/30/2019 05/30/2019 12/19/2018 04/20/2018  PHQ - 2 Score 0 0 0 1  PHQ- 9 Score - - - 3  Exception Documentation Other- indicate reason in comment box - - -     Cognitive Function   6CIT Screen 05/30/2019 05/30/2019  What Year? 0 points 0 points  What month? 0 points 0 points  What time? 0 points 0 points  Count back from 20 0 points 0 points  Months in reverse 0 points 0 points  Repeat phrase 0 points -  Total Score 0 -    Immunization History  Administered Date(s) Administered  . Influenza, Quadrivalent, Recombinant, Inj, Pf 07/18/2018   Screening Tests Health Maintenance  Topic Date Due  .  Hepatitis C Screening  05-11-1961  . HIV Screening  11/21/1974  . TETANUS/TDAP  11/21/1978  . PAP SMEAR-Modifier  11/21/1980  . MAMMOGRAM  11/21/2009  .  COLONOSCOPY  11/21/2009  . INFLUENZA VACCINE  01/02/2020 (Originally 05/05/2019)    Cancer Screenings: Lung: Low Dose CT Chest recommended if Age 6-80 years, 30 pack-year currently smoking OR have quit w/in 15years. Patient does not qualify. Breast:  Up to date on Mammogram? No   Up to date of Bone Density/Dexa? No Colorectal: has never had one- handout given    Plan:  Consider getting a colonoscopy and an up to date mammorgram.  You declined your flu vaccine today, however you said you can get it at your husbands job. Start back walking, as the weather will be getting cooler in the coming months.  I have personally reviewed and noted the following in the patient's chart:   . Medical and social history . Use of alcohol, tobacco or illicit drugs  . Current medications and supplements . Functional ability and status . Nutritional status . Physical activity . Advanced directives . List of other physicians . Hospitalizations, surgeries, and ER visits in previous 12 months . Vitals . Screenings to include cognitive, depression, and falls . Referrals and appointments  In addition, I have reviewed and discussed with patient certain preventive protocols, quality metrics, and best practice recommendations. A written personalized care plan for preventive services as well as general preventive health recommendations were provided to patient.  Steva Coldermily P Scott, CMA  05/30/2019

## 2019-06-20 DIAGNOSIS — F3112 Bipolar disorder, current episode manic without psychotic features, moderate: Secondary | ICD-10-CM | POA: Diagnosis not present

## 2019-06-22 ENCOUNTER — Encounter: Payer: Medicare Other | Admitting: Family Medicine

## 2019-07-04 ENCOUNTER — Other Ambulatory Visit: Payer: Self-pay

## 2019-07-04 DIAGNOSIS — Z20822 Contact with and (suspected) exposure to covid-19: Secondary | ICD-10-CM

## 2019-07-05 LAB — NOVEL CORONAVIRUS, NAA: SARS-CoV-2, NAA: NOT DETECTED

## 2019-08-10 ENCOUNTER — Ambulatory Visit: Payer: Medicare Other | Admitting: Psychology

## 2019-08-13 ENCOUNTER — Ambulatory Visit (INDEPENDENT_AMBULATORY_CARE_PROVIDER_SITE_OTHER): Payer: Medicare Other | Admitting: Psychology

## 2019-08-13 DIAGNOSIS — F310 Bipolar disorder, current episode hypomanic: Secondary | ICD-10-CM

## 2019-10-11 ENCOUNTER — Ambulatory Visit: Payer: Medicare Other | Attending: Internal Medicine

## 2019-10-11 DIAGNOSIS — Z20822 Contact with and (suspected) exposure to covid-19: Secondary | ICD-10-CM | POA: Diagnosis not present

## 2019-10-13 LAB — NOVEL CORONAVIRUS, NAA: SARS-CoV-2, NAA: NOT DETECTED

## 2019-11-07 ENCOUNTER — Ambulatory Visit: Payer: Medicare Other | Admitting: Psychiatry

## 2019-11-09 ENCOUNTER — Other Ambulatory Visit: Payer: Self-pay

## 2019-11-09 ENCOUNTER — Encounter: Payer: Self-pay | Admitting: Psychiatry

## 2019-11-09 ENCOUNTER — Ambulatory Visit (INDEPENDENT_AMBULATORY_CARE_PROVIDER_SITE_OTHER): Payer: Medicare Other | Admitting: Psychiatry

## 2019-11-09 DIAGNOSIS — G2401 Drug induced subacute dyskinesia: Secondary | ICD-10-CM | POA: Diagnosis not present

## 2019-11-09 DIAGNOSIS — F39 Unspecified mood [affective] disorder: Secondary | ICD-10-CM | POA: Diagnosis not present

## 2019-11-09 MED ORDER — HALOPERIDOL 5 MG PO TABS: ORAL_TABLET | ORAL | 5 refills | Status: DC

## 2019-11-09 MED ORDER — LAMOTRIGINE 100 MG PO TABS: 100.0000 mg | ORAL_TABLET | Freq: Every day | ORAL | 5 refills | Status: DC

## 2019-11-09 MED ORDER — BENZTROPINE MESYLATE 1 MG PO TABS: 1.0000 mg | ORAL_TABLET | Freq: Every day | ORAL | 6 refills | Status: DC

## 2019-11-09 NOTE — Progress Notes (Signed)
   11/09/19 1251  Facial and Oral Movements  Muscles of Facial Expression 0  Lips and Perioral Area 0  Jaw 0  Tongue 0  Extremity Movements  Upper (arms, wrists, hands, fingers) 0  Lower (legs, knees, ankles, toes) 0  Trunk Movements  Neck, shoulders, hips 1  Overall Severity  Severity of abnormal movements (highest score from questions above) 0  Incapacitation due to abnormal movements 0  Patient's awareness of abnormal movements (rate only patient's report) 1  AIMS Total Score  AIMS Total Score 2  Reports head movements less than once a month
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
## 2020-01-17 ENCOUNTER — Ambulatory Visit (INDEPENDENT_AMBULATORY_CARE_PROVIDER_SITE_OTHER): Payer: Medicare Other | Admitting: Psychiatry

## 2020-01-17 ENCOUNTER — Encounter: Payer: Self-pay | Admitting: Psychiatry

## 2020-01-17 ENCOUNTER — Other Ambulatory Visit: Payer: Self-pay

## 2020-01-17 DIAGNOSIS — G2401 Drug induced subacute dyskinesia: Secondary | ICD-10-CM

## 2020-01-17 DIAGNOSIS — F39 Unspecified mood [affective] disorder: Secondary | ICD-10-CM

## 2020-01-17 MED ORDER — HALOPERIDOL 5 MG PO TABS: ORAL_TABLET | ORAL | 5 refills | Status: DC

## 2020-01-17 MED ORDER — ARIPIPRAZOLE 15 MG PO TABS: ORAL_TABLET | ORAL | 1 refills | Status: DC

## 2020-01-17 NOTE — Progress Notes (Signed)
   01/17/20 1045  Facial and Oral Movements  Muscles of Facial Expression 1  Lips and Perioral Area 0  Jaw 0  Tongue 0  Extremity Movements  Upper (arms, wrists, hands, fingers) 0  Lower (legs, knees, ankles, toes) 0  Trunk Movements  Neck, shoulders, hips 0  Overall Severity  Severity of abnormal movements (highest score from questions above) 0  Incapacitation due to abnormal movements 0  Patient's awareness of abnormal movements (rate only patient's report) 0  AIMS Total Score  AIMS Total Score 1

## 2020-01-17 NOTE — Progress Notes (Signed)
Tanya Simpson [id number] 10/08/1960 59 y.o.  Subjective:   Patient ID:  Tanya Simpson is a 59 y.o. (DOB 12-30-1960) female.  Chief Complaint:  Chief Complaint  Patient presents with  . Medication Problem  . Follow-up    h/o mood disturbance and possible psychosis    HPI Tanya Simpson presents to the office today for follow-up of h/o mood disturbance and possible psychosis. She reports that she is having stiffness in her neck and will notice that her eyes will drift up and she is unable to redirect her gaze. She reports that her eyes will drift more when she is stressed, tired, or hungry. She is concerned that this could be a side effect of Haldol and requests to be changed to another medication.   She reports that her mood has been stable. Denies anxiety. Sleeping ok. Appetite has been good. Started Weight Watchers yesterday. Energy and motivation have been ok. No change in concentration. Thinking about volunteering. Denies SI.   Denies paranoia or delusions.  She reports that she saw a counselor once through her work. She is going to start working some nights.   AIMS     Office Visit from 01/17/2020 in [facility] Visit from 11/09/2019 in [place] [facility] Visit from 04/18/2019 in [facility] Total Score  1  2  0    GAD-7     Office Visit from 04/20/2018 in [facility] Office Visit from 03/28/2018 in [facility] Office Visit from 03/14/2018 in [facility] Office Visit from 02/28/2018 in [facility] Office Visit from 10/11/2017 in [facility]  Total GAD-7 Score  3  3  1  3  3     PHQ2-9     Office Visit from 05/30/2019 in [facility] Office Visit from 12/19/2018 in [facility] Office Visit from 04/20/2018 in [facility] Office Visit from 03/28/2018 in [facility] Office Visit from 03/14/2018 in [facility]  PHQ-2 Total Score  0  0  1  0  0  PHQ-9 Total Score  --  --  3  2  2        Review of Systems:  Review of Systems  Musculoskeletal: Positive for neck stiffness. Negative for gait problem.  Neurological: Negative for tremors.  Psychiatric/Behavioral:       Please refer to HPI    Has had her first covid vaccine.   Medications: I have reviewed the patient's current medications.  Current Outpatient Medications  Medication Sig Dispense Refill  . acetaminophen (TYLENOL) 325 MG tablet Take 650 mg by mouth every 6 (six) hours as needed for mild pain.    . haloperidol (HALDOL) 5 MG tablet Take 1 tablet twice daily for 3 weeks, then decrease to 1 tablet at bedtime. 90 tablet 5  . loratadine (CLARITIN) 10 MG tablet Take 1 tablet (10 mg total) by mouth daily. 90 tablet 2  . Multiple Vitamin (MULTIVITAMIN) tablet Take 1 tablet by mouth every other day.    . ARIPiprazole (ABILIFY) 15 MG tablet Take 1/2 tab daily for one week, then increase to 1 tablet daily 30 tablet 1  . benztropine (COGENTIN) 1 MG tablet Take 1 tablet (1 mg total) by mouth daily. 30 tablet 6  . lamoTRIgine (LAMICTAL) 100 MG tablet Take 1 tablet (100 mg total) by  mouth at bedtime. 30 tablet 5   No current facility-administered medications for this visit.    Medication Side Effects: None  Allergies: No Known Allergies  Past Medical History:  Diagnosis Date  . Allergic rhinitis   . Anxiety   . Depression     Family History  Problem Relation Age of Onset  . Hypertension Mother   . Impulse control disorder Mother   . CVA Father   . Depression Father     Social History   Socioeconomic History  . Marital status: Married    Spouse name: [name]  . Number of children: 2  . Years of education: 16  . Highest education level: Bachelor's degree (e.g., BA, AB, BS)  Occupational History  . Occupation: [employer]  Tobacco Use  .  Smoking status: Former Smoker    Packs/day: 1.00    Years: 1.00    Pack years: 1.00  . Smokeless tobacco: Never Used  Substance and Sexual Activity  . Alcohol use: Yes    Alcohol/week: 1.0 standard drinks    Types: 1 Glasses of wine per week    Comment: very little  . Drug use: No  . Sexual activity: Yes    Partners: Male  Other Topics Concern  . Not on file  Social History Narrative   Patient lives with her husband here in [place], his name is [name].    Patient works as a Holiday representative at [employer].    Patient is recently from [place].       Hopeful to get back to exercising once the weather cools down.    She was participating in [employer], however the pandemic has made this difficult for her.    Social Determinants of Health   Financial Resource Strain: Low Risk   . Difficulty of Paying Living Expenses: Not hard at all  Food Insecurity: Food Insecurity Present  . Worried About Programme researcher, broadcasting/film/video in the Last Year: Sometimes true  . Ran Out of Food in the Last Year: Sometimes true  Transportation Needs: No Transportation Needs  . Lack of Transportation (Medical): No  . Lack of Transportation (Non-Medical): No  Physical Activity: Insufficiently Active  . Days of Exercise per Week: 7 days  . Minutes of Exercise per Session: 10 min  Stress: Stress Concern Present  . Feeling of Stress : To some extent  Social Connections: Somewhat Isolated  . Frequency of Communication with Friends and Family: Once a week  . Frequency of Social Gatherings with Friends and Family: Never  . Attends Religious Services: More than 4 times per year  . Active Member of Clubs or Organizations: No  . Attends Banker Meetings: Never  . Marital Status: Married  Catering manager Violence: Not At Risk  . Fear of Current or Ex-Partner: No  . Emotionally Abused: No  . Physically Abused: No  . Sexually Abused: No    Past Medical History, Surgical history, Social history, and Family  history were reviewed and updated as appropriate.   Please see review of systems for further details on the patient's review from today.   Objective:   Physical Exam:  There were no vitals taken for this visit.  Physical Exam Constitutional:      General: She is not in acute distress. Musculoskeletal:        General: No deformity.  Neurological:     Mental Status: She is alert and oriented to person, place, and time.  Coordination: Coordination normal.  Psychiatric:        Attention and Perception: Attention and perception normal. She does not perceive auditory or visual hallucinations.        Mood and Affect: Mood is elated. Mood is not anxious or depressed. Affect is not labile, blunt, angry or inappropriate.        Speech: Speech normal.        Behavior: Behavior is cooperative.        Thought Content: Thought content normal. Thought content is not paranoid or delusional. Thought content does not include homicidal or suicidal ideation. Thought content does not include homicidal or suicidal plan.        Cognition and Memory: Cognition and memory normal.        Judgment: Judgment normal.     Comments: Insight intact     Lab Review:     Component Value Date/Time   NA 136 01/31/2019 1128   K 4.6 01/31/2019 1128   CL 99 01/31/2019 1128   CO2 28 01/31/2019 1128   GLUCOSE 80 01/31/2019 1128   BUN 8 01/31/2019 1128   CREATININE 0.67 01/31/2019 1128   CALCIUM 9.3 01/31/2019 1128   PROT 7.1 01/31/2019 1128   ALBUMIN 4.0 01/31/2019 1128   AST 17 01/31/2019 1128   ALT 20 01/31/2019 1128   ALKPHOS 64 01/31/2019 1128   BILITOT 0.5 01/31/2019 1128   GFRNONAA >60 01/31/2019 1128   GFRAA >60 01/31/2019 1128       Component Value Date/Time   WBC 8.3 01/31/2019 1128   RBC 4.21 01/31/2019 1128   HGB 13.1 01/31/2019 1128   HGB 12.7 10/11/2017 1422   HCT 39.5 01/31/2019 1128   HCT 38.0 10/11/2017 1422   PLT 335 01/31/2019 1128   PLT 365 10/11/2017 1422   MCV 93.8  01/31/2019 1128   MCV 93 10/11/2017 1422   MCH 31.1 01/31/2019 1128   MCHC 33.2 01/31/2019 1128   RDW 12.4 01/31/2019 1128   RDW 12.8 10/11/2017 1422   LYMPHSABS 1.8 01/31/2019 1128   MONOABS 0.6 01/31/2019 1128   EOSABS 0.2 01/31/2019 1128   BASOSABS 0.0 01/31/2019 1128    No results found for: POCLITH, LITHIUM   No results found for: PHENYTOIN, PHENOBARB, VALPROATE, CBMZ   .res Assessment: Plan:   Case staffed with Dr. Jennelle Human. Discussed potential benefits, risks, and side effects of Abilify. Discussed potential metabolic side effects associated with atypical antipsychotics, as well as potential risk for movement side effects. Advised pt to contact office if movement side effects occur. Pt agrees to trial of Abilify. Will start Abilify 15 mg 1/2 tab po qd x 1 week, then increase to 1 tab po qd for mood stabilization and to prevent psychosis s/s.  Will decrease Haldol to 5 mg po BID x 3 weeks, then decrease to 1 tab po QHS. Pt to f/u in 4 weeks or sooner if clinically indicated.  Patient advised to contact office with any questions, adverse effects, or acute worsening in signs and symptoms.  Zoelle was seen today for medication problem and follow-up.  Diagnoses and all orders for this visit:  Tardive dyskinesia  Mood disorder ([facility]) -     ARIPiprazole (ABILIFY) 15 MG tablet; Take 1/2 tab daily for one week, then increase to 1 tablet daily -     haloperidol (HALDOL) 5 MG tablet; Take 1 tablet twice daily for 3 weeks, then decrease to 1 tablet at bedtime.     Please see After Visit  Summary for patient specific instructions.  Future Appointments  Date Time Provider Department Center  02/21/2020  9:30 AM Corie Chiquito, PMHNP [facility] None    No orders of the defined types were placed in this encounter.   -------------------------------

## 2020-01-17 NOTE — Progress Notes (Signed)
   01/17/20 1045  Facial and Oral Movements  Muscles of Facial Expression 1  Lips and Perioral Area 0  Jaw 0  Tongue 0  Extremity Movements  Upper (arms, wrists, hands, fingers) 0  Lower (legs, knees, ankles, toes) 0  Trunk Movements  Neck, shoulders, hips 0  Overall Severity  Severity of abnormal movements (highest score from questions above) 0  Incapacitation due to abnormal movements 0  Patient's awareness of abnormal movements (rate only patient's report) 0  AIMS Total Score  AIMS Total Score 1   

## 2020-02-11 ENCOUNTER — Telehealth: Payer: Self-pay | Admitting: Psychiatry

## 2020-02-11 NOTE — Telephone Encounter (Signed)
Pt requesting to speak with Shanda Bumps concerning some medication issues. Appt scheduled for 5/20, but need to speak with her prior to her appt.

## 2020-02-13 NOTE — Telephone Encounter (Signed)
Spoke with her this afternoon and she wants to try and increase the Abilify to 20 mg, she didn't want to increase haldol since she just took it away. Advised her to just take 1/2 tablet to see if that helps, she agreed to that. Will call back with further concerns or problems.

## 2020-02-21 ENCOUNTER — Encounter: Payer: Self-pay | Admitting: Psychiatry

## 2020-02-21 ENCOUNTER — Ambulatory Visit (INDEPENDENT_AMBULATORY_CARE_PROVIDER_SITE_OTHER): Payer: Medicare Other | Admitting: Psychiatry

## 2020-02-21 ENCOUNTER — Other Ambulatory Visit: Payer: Self-pay

## 2020-02-21 DIAGNOSIS — F39 Unspecified mood [affective] disorder: Secondary | ICD-10-CM

## 2020-02-21 MED ORDER — HALOPERIDOL 5 MG PO TABS: ORAL_TABLET | ORAL | 5 refills | Status: DC

## 2020-02-21 MED ORDER — ARIPIPRAZOLE 15 MG PO TABS: ORAL_TABLET | ORAL | 1 refills | Status: DC

## 2020-02-21 NOTE — Progress Notes (Signed)
Tanya Simpson [id number] 11/12/1960 58 y.o.  Subjective:   Patient ID:  Tanya Simpson is a 59 y.o. (DOB 15-Sep-1961) female.  Chief Complaint:  Chief Complaint  Patient presents with  . Follow-up    h/o mood disturbance and paranoia.     HPI Tanya Simpson presents to the office today for follow-up of mood and h/o paranoia. She reports that she is on leave from work and reports that the Archivist "pushed me." She reports that Haldol "clouded my mind and made me less paranoid." Take 2 Haldol at night. She reports that she is sleeping at night. She reports that she initially felt like her mind was "all over the place." She reports that her sleep improved with re-starting Haldol. She reports sleeping about 8 hours a night. She reports that she has lost 5 lbs. Currently on Weight Watchers. She reports that she has been reading some books. She reports that she has been more anxious. Denies SI.    She reports that she has been "nervous that I don't have enough privacy." Reports that her husband "plays some mind games."   Recent marital stress. Has been exercising more. She reports that she misses being able to work full-time. Applied for a job yesterday.   Denies AH or VH.   Has not noticed any recent TD.   AIMS     Office Visit from 01/17/2020 in [facility] Office Visit from 11/09/2019 in [facility] Office Visit from 04/18/2019 in [facility]  AIMS Total Score  1  2  0    GAD-7     Office Visit from 04/20/2018 in [facility] Office Visit from 03/28/2018 in [facility] Office Visit from 03/14/2018 in [facility] Office Visit from 02/28/2018 in [facility] Office Visit from 10/11/2017 in [facility]  Total GAD-7 Score  3  3  1  3  3     PHQ2-9     Office Visit from 05/30/2019 in [facility] Office Visit from 12/19/2018 in  [facility] Office Visit from 04/20/2018 in [facility] Office Visit from 03/28/2018 in [facility] Office Visit from 03/14/2018 in [facility]  PHQ-2 Total Score  0  0  1  0  0  PHQ-9 Total Score  --  --  3  2  2        Review of Systems:  Review of Systems  Medications: I have reviewed the patient's current medications.  Current Outpatient Medications  Medication Sig Dispense Refill  . loratadine (CLARITIN) 10 MG tablet Take 1 tablet (10 mg total) by mouth daily. 90 tablet 2  . acetaminophen (TYLENOL) 325 MG tablet Take 650 mg by mouth every 6 (six) hours as needed for mild pain.    . ARIPiprazole (ABILIFY) 15 MG tablet Take 1.5 tabs po qd 30 tablet 1  . benztropine (COGENTIN) 1 MG tablet Take 1 tablet (1 mg total) by mouth daily. 30 tablet 6  . haloperidol (HALDOL) 5 MG tablet Take 1-2 tabs po QHS 90 tablet 5  . lamoTRIgine (LAMICTAL) 100 MG tablet Take 1 tablet (100 mg total) by mouth at bedtime. 30 tablet 5  . Multiple Vitamin (MULTIVITAMIN) tablet Take 1 tablet by mouth every other day.     No current facility-administered medications for this visit.    Medication Side  Effects: None  Allergies: No Known Allergies  Past Medical History:  Diagnosis Date  . Allergic rhinitis   . Anxiety   . Depression     Family History  Problem Relation Age of Onset  . Hypertension Mother   . Impulse control disorder Mother   . CVA Father   . Depression Father     Social History   Socioeconomic History  . Marital status: Married    Spouse name: Tanya Simpson  . Number of children: 2  . Years of education: 16  . Highest education level: Bachelor's degree (e.g., BA, AB, BS)  Occupational History  . Occupation: [employer]  Tobacco Use  . Smoking status: Former Smoker    Packs/day: 1.00    Years: 1.00    Pack years: 1.00  . Smokeless tobacco: Never Used  Substance and Sexual Activity  . Alcohol use:  Yes    Alcohol/week: 1.0 standard drinks    Types: 1 Glasses of wine per week    Comment: very little  . Drug use: No  . Sexual activity: Yes    Partners: Male  Other Topics Concern  . Not on file  Social History Narrative   Patient lives with her husband here in [place], his name is Tanya Simpson.    Patient works as a Tourist information centre manager at [employer]    Patient is recently from [place].       Hopeful to get back to exercising once the weather cools down.    She was participating in [employer], however the pandemic has made this difficult for her.    Social Determinants of Health   Financial Resource Strain: Low Risk   . Difficulty of Paying Living Expenses: Not hard at all  Food Insecurity: Food Insecurity Present  . Worried About Charity fundraiser in the Last Year: Sometimes true  . Ran Out of Food in the Last Year: Sometimes true  Transportation Needs: No Transportation Needs  . Lack of Transportation (Medical): No  . Lack of Transportation (Non-Medical): No  Physical Activity: Insufficiently Active  . Days of Exercise per Week: 7 days  . Minutes of Exercise per Session: 10 min  Stress: Stress Concern Present  . Feeling of Stress : To some extent  Social Connections: Somewhat Isolated  . Frequency of Communication with Friends and Family: Once a week  . Frequency of Social Gatherings with Friends and Family: Never  . Attends Religious Services: More than 4 times per year  . Active Member of Clubs or Organizations: No  . Attends Archivist Meetings: Never  . Marital Status: Married  Human resources officer Violence: Not At Risk  . Fear of Current or Ex-Partner: No  . Emotionally Abused: No  . Physically Abused: No  . Sexually Abused: No    Past Medical History, Surgical history, Social history, and Family history were reviewed and updated as appropriate.   Please see review of systems for further details on the patient's review from today.   Objective:   Physical  Exam:  There were no vitals taken for this visit.  Physical Exam Constitutional:      General: She is not in acute distress. Musculoskeletal:        General: No deformity.  Neurological:     Mental Status: She is alert and oriented to person, place, and time.     Coordination: Coordination normal.  Psychiatric:        Attention and Perception: Attention and perception normal. She  does not perceive auditory or visual hallucinations.        Mood and Affect: Mood is anxious. Mood is not depressed. Affect is labile. Affect is not blunt, angry or inappropriate.        Speech: Speech is rapid and pressured and tangential.        Behavior: Behavior normal.        Thought Content: Thought content is paranoid. Thought content is not delusional. Thought content does not include homicidal or suicidal ideation. Thought content does not include homicidal or suicidal plan.        Cognition and Memory: Cognition and memory normal.        Judgment: Judgment normal.     Comments: Insight intact     Lab Review:     Component Value Date/Time   NA 136 01/31/2019 1128   K 4.6 01/31/2019 1128   CL 99 01/31/2019 1128   CO2 28 01/31/2019 1128   GLUCOSE 80 01/31/2019 1128   BUN 8 01/31/2019 1128   CREATININE 0.67 01/31/2019 1128   CALCIUM 9.3 01/31/2019 1128   PROT 7.1 01/31/2019 1128   ALBUMIN 4.0 01/31/2019 1128   AST 17 01/31/2019 1128   ALT 20 01/31/2019 1128   ALKPHOS 64 01/31/2019 1128   BILITOT 0.5 01/31/2019 1128   GFRNONAA >60 01/31/2019 1128   GFRAA >60 01/31/2019 1128       Component Value Date/Time   WBC 8.3 01/31/2019 1128   RBC 4.21 01/31/2019 1128   HGB 13.1 01/31/2019 1128   HGB 12.7 10/11/2017 1422   HCT 39.5 01/31/2019 1128   HCT 38.0 10/11/2017 1422   PLT 335 01/31/2019 1128   PLT 365 10/11/2017 1422   MCV 93.8 01/31/2019 1128   MCV 93 10/11/2017 1422   MCH 31.1 01/31/2019 1128   MCHC 33.2 01/31/2019 1128   RDW 12.4 01/31/2019 1128   RDW 12.8 10/11/2017 1422    LYMPHSABS 1.8 01/31/2019 1128   MONOABS 0.6 01/31/2019 1128   EOSABS 0.2 01/31/2019 1128   BASOSABS 0.0 01/31/2019 1128    No results found for: POCLITH, LITHIUM   No results found for: PHENYTOIN, PHENOBARB, VALPROATE, CBMZ   .res Assessment: Plan:   Will increase Abilify to 15 mg 1.5 tabs po qd to improve mood and thoughts. Discussed that she could try to decrease Haldol to 5 mg po QHS after about 1 week and that she can continue to take 10 mg po QHS if unable to sleep with Haldol 5 mg po QHS. Haldol 5 mg 1-2 tabs po QHS.  Recommended that pt contact office in approximately 2 weeks to discuss how she is doing with Abilify 22.5 mg po qd. Discussed that Abilify 20 mg tabs can be prescribed or can be increased to 30 mg po qd if needed.  Pt to f/u in 4 weeks or sooner if clinically indicated.  Patient advised to contact office with any questions, adverse effects, or acute worsening in signs and symptoms.  Tanya Simpson was seen today for follow-up.  Diagnoses and all orders for this visit:  Mood disorder (HCC) -     ARIPiprazole (ABILIFY) 15 MG tablet; Take 1.5 tabs po qd -     haloperidol (HALDOL) 5 MG tablet; Take 1-2 tabs po QHS     Please see After Visit Summary for patient specific instructions.  Future Appointments  Date Time Provider Department Center  03/25/2020 10:00 AM Corie Chiquito, PMHNP [facility] None    No orders of the defined types  were placed in this encounter.   -------------------------------

## 2020-03-06 ENCOUNTER — Telehealth: Payer: Self-pay | Admitting: Psychiatry

## 2020-03-06 NOTE — Telephone Encounter (Signed)
Tanya Simpson called needing to discuss her medications doses/strengths.  She has one about needing to be refilled but doesn't want to request the refill until she discusses her medications with you.  Has appt 6/22, but needs a call to discuss her issues.

## 2020-03-07 ENCOUNTER — Other Ambulatory Visit: Payer: Self-pay

## 2020-03-07 DIAGNOSIS — F39 Unspecified mood [affective] disorder: Secondary | ICD-10-CM

## 2020-03-07 MED ORDER — ARIPIPRAZOLE 15 MG PO TABS: ORAL_TABLET | ORAL | 1 refills | Status: DC

## 2020-03-07 NOTE — Telephone Encounter (Signed)
Patient needing a refill for her Aripiprazole 15 mg 1.5 tablets daily, send to [facility]. Said she would discuss issues with Shanda Bumps at apt on 03/25/2020. Nothing urgent.

## 2020-03-07 NOTE — Telephone Encounter (Signed)
RX sent

## 2020-03-21 ENCOUNTER — Ambulatory Visit: Payer: Medicare Other | Admitting: Family Medicine

## 2020-03-25 ENCOUNTER — Ambulatory Visit (INDEPENDENT_AMBULATORY_CARE_PROVIDER_SITE_OTHER): Payer: Medicare Other | Admitting: Psychiatry

## 2020-03-25 ENCOUNTER — Encounter: Payer: Self-pay | Admitting: Psychiatry

## 2020-03-25 ENCOUNTER — Other Ambulatory Visit: Payer: Self-pay

## 2020-03-25 DIAGNOSIS — F39 Unspecified mood [affective] disorder: Secondary | ICD-10-CM

## 2020-03-25 DIAGNOSIS — F29 Unspecified psychosis not due to a substance or known physiological condition: Secondary | ICD-10-CM

## 2020-03-25 MED ORDER — HALOPERIDOL 5 MG PO TABS: 5.0000 mg | ORAL_TABLET | Freq: Two times a day (BID) | ORAL | 1 refills | Status: DC

## 2020-03-25 NOTE — Progress Notes (Signed)
°   03/25/20 1033  Facial and Oral Movements  Muscles of Facial Expression 0  Lips and Perioral Area 0  Jaw 0  Tongue 0  Extremity Movements  Upper (arms, wrists, hands, fingers) 0  Lower (legs, knees, ankles, toes) 0  Trunk Movements  Neck, shoulders, hips 0  Overall Severity  Severity of abnormal movements (highest score from questions above) 0  Incapacitation due to abnormal movements 0  Patient's awareness of abnormal movements (rate only patient's report) 0  AIMS Total Score  AIMS Total Score 0

## 2020-03-25 NOTE — Progress Notes (Signed)
Chantrice Hagg [id number] 17-Oct-1960 59 y.o.  Subjective:   Patient ID:  Tanya Simpson is a 59 y.o. (DOB 10-25-1960) female.  Chief Complaint:  Chief Complaint  Patient presents with  . Follow-up    h/o Paranoia, anxiety, and mood disturbance    HPI Tanya Simpson presents to the office today for follow-up of h/o paranoia, mood disturbance, and insomnia. She reports that she has been "more tense" without Haldol in the morning. She notices some anger and feeling nervous. Denies risky or impulsive behavior. She reports that she has had some difficulty sleeping at times. Slept 6-7 hours a night. Appetite has been the same and is intentionally trying to lose weight with Weight Watchers. Denies racing thoughts.  She reports that energy and motivation have been good. She reports adequate concentration. Denies SI.   Taking Haldol at night and reports that she may need more Haldol in the morning.   "I do feel a little bit more paranoid." Reports that she had some feelings that someone is messing with her laptop. She reports that she feels as if others are talking about him. "I could get shot."   She reports that she has not noticed any recent Tardive Dyskinesia recently- "but I'm not at work" and that she noticed more TD at work.   No longer employed for [employer]. Considering getting a job at [employer].   Taking Haldol 5 mg po QHS.   AIMS     Office Visit from 03/25/2020 in [facility] Visit from 01/17/2020 in [place] Visit from 11/09/2019 in [place] Visit from 04/18/2019 in [place] Total Score 0 1 2 0    GAD-7     Office Visit from 04/20/2018 in [place] Office Visit from 03/28/2018 in [place] Office Visit from 03/14/2018 in [facility] Office Visit from 02/28/2018 in [facility] Office Visit from  10/11/2017 in [place]  Total GAD-7 Score 3 3 1 3 3     PHQ2-9     Office Visit from 05/30/2019 in [place] Office Visit from 12/19/2018 in [place] Office Visit from 04/20/2018 in [place] Office Visit from 03/28/2018 in [place] Office Visit from 03/14/2018 in [place]  PHQ-2 Total Score 0 0 1 0 0  PHQ-9 Total Score -- -- 3 2 2        Review of Systems:  Review of Systems  Musculoskeletal: Negative for gait problem.  Neurological: Negative for tremors.  Psychiatric/Behavioral:       Please refer to HPI    Medications: I have reviewed the patient's current medications.  Current Outpatient Medications  Medication Sig Dispense Refill  . acetaminophen (TYLENOL) 325 MG tablet Take 650 mg by mouth every 6 (six) hours as needed for mild pain.    . ARIPiprazole (ABILIFY) 15 MG tablet Take 1.5 tabs po qd 45 tablet 1  . haloperidol (HALDOL) 5 MG tablet Take 1 tablet (5 mg total) by mouth 2 (two) times daily. 60 tablet 1  . loratadine (CLARITIN) 10 MG tablet Take 1 tablet (10 mg total) by mouth daily. 90 tablet 2  . Multiple Vitamin (MULTIVITAMIN) tablet Take 1 tablet by mouth every other day.    . benztropine (COGENTIN) 1 MG tablet Take 1 tablet (1 mg  total) by mouth daily. 30 tablet 6  . lamoTRIgine (LAMICTAL) 100 MG tablet Take 1 tablet (100 mg total) by mouth at bedtime. 30 tablet 5   No current facility-administered medications for this visit.    Medication Side Effects: None  Allergies: No Known Allergies  Past Medical History:  Diagnosis Date  . Allergic rhinitis   . Anxiety   . Depression     Family History  Problem Relation Age of Onset  . Hypertension Mother   . Impulse control disorder Mother   . CVA Father   . Depression Father     Social History   Socioeconomic History  . Marital status: Married    Spouse name: [name]   . Number of children: 2  . Years of education: 16  . Highest education level: Bachelor's degree (e.g., BA, AB, BS)  Occupational History  . Occupation: [employer]  Tobacco Use  . Smoking status: Former Smoker    Packs/day: 1.00    Years: 1.00    Pack years: 1.00  . Smokeless tobacco: Never Used  Vaping Use  . Vaping Use: Never used  Substance and Sexual Activity  . Alcohol use: Yes    Alcohol/week: 1.0 standard drink    Types: 1 Glasses of wine per week    Comment: very little  . Drug use: No  . Sexual activity: Yes    Partners: Male  Other Topics Concern  . Not on file  Social History Narrative   Patient lives with her husband here in [place], his name is [name].    Patient works as a Holiday representative at [employer].    Patient is recently from [place].       Hopeful to get back to exercising once the weather cools down.    She was participating in [employer], however the pandemic has made this difficult for her.    Social Determinants of Health   Financial Resource Strain: Low Risk   . Difficulty of Paying Living Expenses: Not hard at all  Food Insecurity: Food Insecurity Present  . Worried About Programme researcher, broadcasting/film/video in the Last Year: Sometimes true  . Ran Out of Food in the Last Year: Sometimes true  Transportation Needs: No Transportation Needs  . Lack of Transportation (Medical): No  . Lack of Transportation (Non-Medical): No  Physical Activity: Insufficiently Active  . Days of Exercise per Week: 7 days  . Minutes of Exercise per Session: 10 min  Stress: Stress Concern Present  . Feeling of Stress : To some extent  Social Connections: Moderately Isolated  . Frequency of Communication with Friends and Family: Once a week  . Frequency of Social Gatherings with Friends and Family: Never  . Attends Religious Services: More than 4 times per year  . Active Member of Clubs or Organizations: No  . Attends Banker Meetings: Never  . Marital Status: Married   Catering manager Violence: Not At Risk  . Fear of Current or Ex-Partner: No  . Emotionally Abused: No  . Physically Abused: No  . Sexually Abused: No    Past Medical History, Surgical history, Social history, and Family history were reviewed and updated as appropriate.   Please see review of systems for further details on the patient's review from today.   Objective:   Physical Exam:  There were no vitals taken for this visit.  Physical Exam Constitutional:      General: She is not in acute distress. Musculoskeletal:  General: No deformity.  Neurological:     Mental Status: She is alert and oriented to person, place, and time.     Coordination: Coordination normal.  Psychiatric:        Attention and Perception: Attention and perception normal. She does not perceive auditory or visual hallucinations.        Mood and Affect: Mood is anxious. Mood is not depressed. Affect is not labile, blunt, angry or inappropriate.        Speech: Speech is rapid and pressured.        Behavior: Behavior is agitated. Behavior is cooperative.        Thought Content: Thought content is paranoid. Thought content is not delusional. Thought content does not include homicidal or suicidal ideation. Thought content does not include homicidal or suicidal plan.        Cognition and Memory: Cognition and memory normal.        Judgment: Judgment normal.     Comments: Insight intact Some rumination about the past.      Lab Review:     Component Value Date/Time   NA 136 01/31/2019 1128   K 4.6 01/31/2019 1128   CL 99 01/31/2019 1128   CO2 28 01/31/2019 1128   GLUCOSE 80 01/31/2019 1128   BUN 8 01/31/2019 1128   CREATININE 0.67 01/31/2019 1128   CALCIUM 9.3 01/31/2019 1128   PROT 7.1 01/31/2019 1128   ALBUMIN 4.0 01/31/2019 1128   AST 17 01/31/2019 1128   ALT 20 01/31/2019 1128   ALKPHOS 64 01/31/2019 1128   BILITOT 0.5 01/31/2019 1128   GFRNONAA >60 01/31/2019 1128   GFRAA >60 01/31/2019  1128       Component Value Date/Time   WBC 8.3 01/31/2019 1128   RBC 4.21 01/31/2019 1128   HGB 13.1 01/31/2019 1128   HGB 12.7 10/11/2017 1422   HCT 39.5 01/31/2019 1128   HCT 38.0 10/11/2017 1422   PLT 335 01/31/2019 1128   PLT 365 10/11/2017 1422   MCV 93.8 01/31/2019 1128   MCV 93 10/11/2017 1422   MCH 31.1 01/31/2019 1128   MCHC 33.2 01/31/2019 1128   RDW 12.4 01/31/2019 1128   RDW 12.8 10/11/2017 1422   LYMPHSABS 1.8 01/31/2019 1128   MONOABS 0.6 01/31/2019 1128   EOSABS 0.2 01/31/2019 1128   BASOSABS 0.0 01/31/2019 1128    No results found for: POCLITH, LITHIUM   No results found for: PHENYTOIN, PHENOBARB, VALPROATE, CBMZ   .res Assessment: Plan:   Discussed possible treatment options with patient since she reports some increased paranoia and anxiety. She reports that she would like to resume Haldol twice daily and felt less anxious and paranoid when taking Haldol 5 mg in the morning in addition to Haldol at HS.  Will increase Haldol to 5 mg po BID. Will continue Abilify 15 mg 1.5 tabs po qd. Continue lamictal 100 mg po QHS for mood s/s. Continue Cogentin 1 mg po qd for TD.  Pt to f/u in 4 weeks or sooner if clinically indicated.  Patient advised to contact office with any questions, adverse effects, or acute worsening in signs and symptoms.  Tanya Simpson was seen today for follow-up.  Diagnoses and all orders for this visit:  Psychosis, unspecified psychosis type (HCC)  Mood disorder (HCC) -     haloperidol (HALDOL) 5 MG tablet; Take 1 tablet (5 mg total) by mouth 2 (two) times daily.     Please see After Visit Summary for patient specific instructions.  Future Appointments  Date Time Provider Department Center  03/26/2020  9:50 AM [name], [name], DO [facility]  04/08/2020 10:00 AM [facility] None  04/25/2020  9:30 AM Corie Chiquito, PMHNP [facility] None    No orders of the defined types were placed in this  encounter.   -------------------------------

## 2020-03-26 ENCOUNTER — Encounter: Payer: Self-pay | Admitting: Family Medicine

## 2020-03-26 ENCOUNTER — Ambulatory Visit (INDEPENDENT_AMBULATORY_CARE_PROVIDER_SITE_OTHER): Payer: Medicare Other | Admitting: Family Medicine

## 2020-03-26 ENCOUNTER — Other Ambulatory Visit: Payer: Self-pay

## 2020-03-26 VITALS — BP 112/70 | HR 64 | Ht 63.0 in

## 2020-03-26 DIAGNOSIS — Z13220 Encounter for screening for lipoid disorders: Secondary | ICD-10-CM | POA: Diagnosis not present

## 2020-03-26 DIAGNOSIS — Z5181 Encounter for therapeutic drug level monitoring: Secondary | ICD-10-CM | POA: Diagnosis not present

## 2020-03-26 DIAGNOSIS — Z1211 Encounter for screening for malignant neoplasm of colon: Secondary | ICD-10-CM

## 2020-03-26 DIAGNOSIS — Z0000 Encounter for general adult medical examination without abnormal findings: Secondary | ICD-10-CM

## 2020-03-26 DIAGNOSIS — Z1231 Encounter for screening mammogram for malignant neoplasm of breast: Secondary | ICD-10-CM

## 2020-03-26 DIAGNOSIS — F310 Bipolar disorder, current episode hypomanic: Secondary | ICD-10-CM

## 2020-03-26 NOTE — Patient Instructions (Signed)
Thank you for coming to see me today. It was a pleasure! Today we talked about:   I will release your results on MyChart and call you if anything is abnormal.   Please follow-up with our clinic in 1 year or sooner as needed.  If you have any questions or concerns, please do not hesitate to call the office at [phone].  Take Care,   [name] Khalin Royce, DO

## 2020-03-26 NOTE — Progress Notes (Signed)
    SUBJECTIVE:   Chief compliant/HPI: annual examination  Tanya Simpson is a 59 y.o. who presents today for an annual exam. She refused labs at last visit but is amenable to have them today. She reports her psychiatrist has been adjusting her medications- trying to wean her haldol.  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   History tabs reviewed and updated.   Review of systems form reviewed and notable for no concerns at this time.   OBJECTIVE:   BP 112/70   Pulse 64   Ht 5\' 3"  (1.6 m)   SpO2 99%   BMI 25.86 kg/m   General: NAD, pleasant Eyes: PERRL, EOMI, no conjunctival pallor or injection Neck: Supple Respiratory:  normal work of breathing MSK: moves 4 extremities equally Derm: no rashes appreciated Psych: AOx3, appropriate affect, tangential, rushed speech  ASSESSMENT/PLAN:   No problem-specific Assessment & Plan notes found for this encounter.    Annual Examination  See AVS for age appropriate recommendations.   PHQ2 score 1, reviewed and discussed. Patient follows closely with psychiatrist who manages her psych medications  Blood pressure reviewed and at goal.   Considered the following items based upon USPSTF recommendations: Diabetes screening: discussed and glucose normal on bmp Screening for elevated cholesterol: ordered and LDL elevated minimally to , patient encouraged to work on lifestyle modifications. Hepatitis C: ordered   Cervical cancer screening: due for pap, patinet declined today but will schedule appointment in future for pap smear Breast cancer screening: discussed and ordered mammogram based upon personal history  Colorectal cancer screening: discussed options, elected for cologuard vs colonoscopy, patient to discuss with husband and decide. Given handout for cologuard.  Follow up in 1 year or sooner for pap smear.    [address]arn[address] Lill, DO [facility]

## 2020-03-27 LAB — COMPREHENSIVE METABOLIC PANEL
ALT: 24 IU/L (ref 0–32)
AST: 22 IU/L (ref 0–40)
Albumin/Globulin Ratio: 1.6 (ref 1.2–2.2)
Albumin: 4.7 g/dL (ref 3.8–4.9)
Alkaline Phosphatase: 86 IU/L (ref 48–121)
BUN/Creatinine Ratio: 13 (ref 12–28)
BUN: 10 mg/dL (ref 8–27)
Bilirubin Total: 0.3 mg/dL (ref 0.0–1.2)
CO2: 24 mmol/L (ref 20–29)
Calcium: 9.8 mg/dL (ref 8.7–10.3)
Chloride: 99 mmol/L (ref 96–106)
Creatinine, Ser: 0.79 mg/dL (ref 0.57–1.00)
GFR calc Af Amer: 94 mL/min/{1.73_m2} (ref >59)
GFR calc non Af Amer: 82 mL/min/{1.73_m2} (ref >59)
Globulin, Total: 3 g/dL (ref 1.5–4.5)
Glucose: 87 mg/dL (ref 65–99)
Potassium: 4.8 mmol/L (ref 3.5–5.2)
Sodium: 136 mmol/L (ref 134–144)
Total Protein: 7.7 g/dL (ref 6.0–8.5)

## 2020-03-27 LAB — LIPID PANEL
Chol/HDL Ratio: 3 ratio (ref 0.0–4.4)
Cholesterol, Total: 185 mg/dL (ref 100–199)
HDL: 61 mg/dL (ref >39)
LDL Chol Calc (NIH): 107 mg/dL — ABNORMAL HIGH (ref 0–99)
Triglycerides: 92 mg/dL (ref 0–149)
VLDL Cholesterol Cal: 17 mg/dL (ref 5–40)

## 2020-03-27 LAB — CBC
Hematocrit: 40.6 % (ref 34.0–46.6)
Hemoglobin: 13.5 g/dL (ref 11.1–15.9)
MCH: 30.3 pg (ref 26.6–33.0)
MCHC: 33.3 g/dL (ref 31.5–35.7)
MCV: 91 fL (ref 79–97)
Platelets: 399 10*3/uL (ref 150–450)
RBC: 4.45 x10E6/uL (ref 3.77–5.28)
RDW: 11.8 % (ref 11.7–15.4)
WBC: 8.4 10*3/uL (ref 3.4–10.8)

## 2020-03-27 LAB — HEPATITIS C ANTIBODY: Hep C Virus Ab: <0.1 s/co ratio (ref 0.0–0.9)

## 2020-03-28 ENCOUNTER — Inpatient Hospital Stay (HOSPITAL_COMMUNITY)
Admission: EM | Admit: 2020-03-28 | Discharge: 2020-04-01 | DRG: 918 | Disposition: A | Discharge status: Other Acute Inpatient Hospital | Payer: Medicare Other | Attending: Internal Medicine | Admitting: Internal Medicine

## 2020-03-28 ENCOUNTER — Emergency Department (HOSPITAL_COMMUNITY): Payer: Medicare Other

## 2020-03-28 ENCOUNTER — Other Ambulatory Visit: Payer: Self-pay

## 2020-03-28 ENCOUNTER — Inpatient Hospital Stay (HOSPITAL_COMMUNITY): Payer: Medicare Other

## 2020-03-28 ENCOUNTER — Telehealth: Payer: Self-pay | Admitting: Psychiatry

## 2020-03-28 ENCOUNTER — Encounter (HOSPITAL_COMMUNITY): Payer: Self-pay | Admitting: Student

## 2020-03-28 DIAGNOSIS — Z818 Family history of other mental and behavioral disorders: Secondary | ICD-10-CM

## 2020-03-28 DIAGNOSIS — Z915 Personal history of self-harm: Secondary | ICD-10-CM

## 2020-03-28 DIAGNOSIS — Z87891 Personal history of nicotine dependence: Secondary | ICD-10-CM

## 2020-03-28 DIAGNOSIS — R4182 Altered mental status, unspecified: Secondary | ICD-10-CM | POA: Diagnosis not present

## 2020-03-28 DIAGNOSIS — E876 Hypokalemia: Secondary | ICD-10-CM | POA: Diagnosis present

## 2020-03-28 DIAGNOSIS — R410 Disorientation, unspecified: Secondary | ICD-10-CM

## 2020-03-28 DIAGNOSIS — R339 Retention of urine, unspecified: Secondary | ICD-10-CM | POA: Diagnosis present

## 2020-03-28 DIAGNOSIS — E872 Acidosis: Secondary | ICD-10-CM | POA: Diagnosis present

## 2020-03-28 DIAGNOSIS — E860 Dehydration: Secondary | ICD-10-CM | POA: Diagnosis present

## 2020-03-28 DIAGNOSIS — Z20822 Contact with and (suspected) exposure to covid-19: Secondary | ICD-10-CM | POA: Diagnosis present

## 2020-03-28 DIAGNOSIS — F310 Bipolar disorder, current episode hypomanic: Secondary | ICD-10-CM | POA: Diagnosis not present

## 2020-03-28 DIAGNOSIS — T50902A Poisoning by unspecified drugs, medicaments and biological substances, intentional self-harm, initial encounter: Secondary | ICD-10-CM

## 2020-03-28 DIAGNOSIS — R739 Hyperglycemia, unspecified: Secondary | ICD-10-CM | POA: Diagnosis present

## 2020-03-28 DIAGNOSIS — F319 Bipolar disorder, unspecified: Secondary | ICD-10-CM | POA: Diagnosis not present

## 2020-03-28 DIAGNOSIS — F312 Bipolar disorder, current episode manic severe with psychotic features: Secondary | ICD-10-CM | POA: Diagnosis not present

## 2020-03-28 DIAGNOSIS — E162 Hypoglycemia, unspecified: Secondary | ICD-10-CM | POA: Diagnosis present

## 2020-03-28 DIAGNOSIS — D539 Nutritional anemia, unspecified: Secondary | ICD-10-CM | POA: Diagnosis present

## 2020-03-28 DIAGNOSIS — R4020 Unspecified coma: Secondary | ICD-10-CM | POA: Diagnosis not present

## 2020-03-28 DIAGNOSIS — T434X2A Poisoning by butyrophenone and thiothixene neuroleptics, intentional self-harm, initial encounter: Secondary | ICD-10-CM | POA: Diagnosis present

## 2020-03-28 DIAGNOSIS — T426X2A Poisoning by other antiepileptic and sedative-hypnotic drugs, intentional self-harm, initial encounter: Principal | ICD-10-CM | POA: Diagnosis present

## 2020-03-28 DIAGNOSIS — T43592A Poisoning by other antipsychotics and neuroleptics, intentional self-harm, initial encounter: Secondary | ICD-10-CM | POA: Diagnosis present

## 2020-03-28 DIAGNOSIS — D72829 Elevated white blood cell count, unspecified: Secondary | ICD-10-CM | POA: Diagnosis present

## 2020-03-28 DIAGNOSIS — R338 Other retention of urine: Secondary | ICD-10-CM | POA: Diagnosis not present

## 2020-03-28 DIAGNOSIS — T50904A Poisoning by unspecified drugs, medicaments and biological substances, undetermined, initial encounter: Secondary | ICD-10-CM | POA: Diagnosis not present

## 2020-03-28 DIAGNOSIS — T443X2A Poisoning by other parasympatholytics [anticholinergics and antimuscarinics] and spasmolytics, intentional self-harm, initial encounter: Secondary | ICD-10-CM | POA: Diagnosis present

## 2020-03-28 DIAGNOSIS — Z79899 Other long term (current) drug therapy: Secondary | ICD-10-CM | POA: Diagnosis not present

## 2020-03-28 DIAGNOSIS — F209 Schizophrenia, unspecified: Secondary | ICD-10-CM | POA: Diagnosis present

## 2020-03-28 DIAGNOSIS — F314 Bipolar disorder, current episode depressed, severe, without psychotic features: Secondary | ICD-10-CM | POA: Diagnosis present

## 2020-03-28 HISTORY — DX: Schizophrenia, unspecified: F20.9

## 2020-03-28 LAB — BLOOD GAS, VENOUS
Acid-base deficit: 2.1 mmol/L — ABNORMAL HIGH (ref 0.0–2.0)
Bicarbonate: 24.5 mmol/L (ref 20.0–28.0)
FIO2: 21
O2 Saturation: 50.4 %
Patient temperature: 98.6
pCO2, Ven: 53 mmHg (ref 44.0–60.0)
pH, Ven: 7.286 (ref 7.250–7.430)
pO2, Ven: 33.3 mmHg (ref 32.0–45.0)

## 2020-03-28 LAB — COMPREHENSIVE METABOLIC PANEL
ALT: 21 U/L (ref 0–44)
AST: 22 U/L (ref 15–41)
Albumin: 4.1 g/dL (ref 3.5–5.0)
Alkaline Phosphatase: 58 U/L (ref 38–126)
Anion gap: 10 (ref 5–15)
BUN: 12 mg/dL (ref 6–20)
CO2: 25 mmol/L (ref 22–32)
Calcium: 8.3 mg/dL — ABNORMAL LOW (ref 8.9–10.3)
Chloride: 98 mmol/L (ref 98–111)
Creatinine, Ser: 0.64 mg/dL (ref 0.44–1.00)
GFR calc Af Amer: >60 mL/min (ref >60)
GFR calc non Af Amer: >60 mL/min (ref >60)
Glucose, Bld: 166 mg/dL — ABNORMAL HIGH (ref 70–99)
Potassium: 3.3 mmol/L — ABNORMAL LOW (ref 3.5–5.1)
Sodium: 133 mmol/L — ABNORMAL LOW (ref 135–145)
Total Bilirubin: 0.4 mg/dL (ref 0.3–1.2)
Total Protein: 7.1 g/dL (ref 6.5–8.1)

## 2020-03-28 LAB — URINALYSIS, ROUTINE W REFLEX MICROSCOPIC
Bacteria, UA: NONE SEEN
Bilirubin Urine: NEGATIVE
Glucose, UA: >=500 mg/dL — AB
Ketones, ur: NEGATIVE mg/dL
Leukocytes,Ua: NEGATIVE
Nitrite: NEGATIVE
Protein, ur: NEGATIVE mg/dL
Specific Gravity, Urine: 1.006 (ref 1.005–1.030)
pH: 7 (ref 5.0–8.0)

## 2020-03-28 LAB — CBC WITH DIFFERENTIAL/PLATELET
Abs Immature Granulocytes: 0.24 10*3/uL — ABNORMAL HIGH (ref 0.00–0.07)
Basophils Absolute: 0 10*3/uL (ref 0.0–0.1)
Basophils Relative: 0 %
Eosinophils Absolute: 0 10*3/uL (ref 0.0–0.5)
Eosinophils Relative: 0 %
HCT: 37.8 % (ref 36.0–46.0)
Hemoglobin: 12.5 g/dL (ref 12.0–15.0)
Immature Granulocytes: 1 %
Lymphocytes Relative: 3 %
Lymphs Abs: 0.8 10*3/uL (ref 0.7–4.0)
MCH: 31.5 pg (ref 26.0–34.0)
MCHC: 33.1 g/dL (ref 30.0–36.0)
MCV: 95.2 fL (ref 80.0–100.0)
Monocytes Absolute: 0.8 10*3/uL (ref 0.1–1.0)
Monocytes Relative: 3 %
Neutro Abs: 23.7 10*3/uL — ABNORMAL HIGH (ref 1.7–7.7)
Neutrophils Relative %: 93 %
Platelets: 332 10*3/uL (ref 150–400)
RBC: 3.97 MIL/uL (ref 3.87–5.11)
RDW: 11.9 % (ref 11.5–15.5)
WBC: 25.5 10*3/uL — ABNORMAL HIGH (ref 4.0–10.5)
nRBC: 0 % (ref 0.0–0.2)

## 2020-03-28 LAB — RAPID URINE DRUG SCREEN, HOSP PERFORMED
Amphetamines: NOT DETECTED
Barbiturates: NOT DETECTED
Benzodiazepines: NOT DETECTED
Cocaine: NOT DETECTED
Opiates: NOT DETECTED
Tetrahydrocannabinol: NOT DETECTED

## 2020-03-28 LAB — SALICYLATE LEVEL: Salicylate Lvl: <7 mg/dL — ABNORMAL LOW (ref 7.0–30.0)

## 2020-03-28 LAB — MAGNESIUM: Magnesium: 1.6 mg/dL — ABNORMAL LOW (ref 1.7–2.4)

## 2020-03-28 LAB — ACETAMINOPHEN LEVEL: Acetaminophen (Tylenol), Serum: <10 ug/mL — ABNORMAL LOW (ref 10–30)

## 2020-03-28 LAB — ETHANOL: Alcohol, Ethyl (B): <10 mg/dL (ref <10)

## 2020-03-28 LAB — CBG MONITORING, ED
Glucose-Capillary: 227 mg/dL — ABNORMAL HIGH (ref 70–99)
Glucose-Capillary: 28 mg/dL — CL (ref 70–99)

## 2020-03-28 LAB — SARS CORONAVIRUS 2 BY RT PCR (HOSPITAL ORDER, PERFORMED IN ~~LOC~~ HOSPITAL LAB): SARS Coronavirus 2: NEGATIVE

## 2020-03-28 LAB — AMMONIA: Ammonia: 26 umol/L (ref 9–35)

## 2020-03-28 MED ORDER — LORAZEPAM 2 MG/ML IJ SOLN
2.0000 mg | Freq: Four times a day (QID) | INTRAMUSCULAR | Status: DC | PRN
  Administered 2020-03-28: 2 mg via INTRAVENOUS
  Filled 2020-03-28: qty 1

## 2020-03-28 MED ORDER — SODIUM CHLORIDE 0.9 % IV BOLUS
1000.0000 mL | Freq: Once | INTRAVENOUS | Status: AC
  Administered 2020-03-28: 1000 mL via INTRAVENOUS

## 2020-03-28 MED ORDER — BENZTROPINE MESYLATE 1 MG PO TABS: 1.0000 mg | ORAL_TABLET | Freq: Every day | ORAL | Status: DC

## 2020-03-28 MED ORDER — ONDANSETRON HCL 4 MG/2ML IJ SOLN
4.0000 mg | Freq: Four times a day (QID) | INTRAMUSCULAR | Status: DC | PRN
  Administered 2020-03-30: 4 mg via INTRAVENOUS
  Filled 2020-03-28: qty 2

## 2020-03-28 MED ORDER — ONDANSETRON HCL 4 MG PO TABS: 4.0000 mg | ORAL_TABLET | Freq: Four times a day (QID) | ORAL | Status: DC | PRN

## 2020-03-28 MED ORDER — DEXTROSE 50 % IV SOLN: 1.0000 | Freq: Once | INTRAVENOUS | Status: AC

## 2020-03-28 MED ORDER — ARIPIPRAZOLE 5 MG PO TABS: 22.5000 mg | ORAL_TABLET | Freq: Every day | ORAL | Status: DC

## 2020-03-28 MED ORDER — HALOPERIDOL LACTATE 5 MG/ML IJ SOLN: 10.0000 mg | Freq: Once | INTRAMUSCULAR | Status: DC

## 2020-03-28 MED ORDER — HALOPERIDOL 5 MG PO TABS: 5.0000 mg | ORAL_TABLET | ORAL | Status: DC

## 2020-03-28 MED ORDER — LORATADINE 10 MG PO TABS
10.0000 mg | ORAL_TABLET | Freq: Every day | ORAL | Status: DC
  Filled 2020-03-28 (×3): qty 1

## 2020-03-28 MED ORDER — ENOXAPARIN SODIUM 40 MG/0.4ML ~~LOC~~ SOLN
40.0000 mg | Freq: Every day | SUBCUTANEOUS | Status: DC
  Administered 2020-03-28 – 2020-03-31 (×4): 40 mg via SUBCUTANEOUS
  Filled 2020-03-28 (×4): qty 0.4

## 2020-03-28 MED ORDER — POTASSIUM CHLORIDE IN NACL 20-0.9 MEQ/L-% IV SOLN
INTRAVENOUS | Status: DC
  Filled 2020-03-28: qty 1000

## 2020-03-28 MED ORDER — METOPROLOL TARTRATE 5 MG/5ML IV SOLN: 5.0000 mg | Freq: Four times a day (QID) | INTRAVENOUS | Status: DC | PRN

## 2020-03-28 MED ORDER — DEXTROSE 50 % IV SOLN
INTRAVENOUS | Status: AC
  Administered 2020-03-28: 50 mL via INTRAVENOUS
  Filled 2020-03-28: qty 50

## 2020-03-28 MED ORDER — HALOPERIDOL LACTATE 5 MG/ML IJ SOLN
5.0000 mg | Freq: Once | INTRAMUSCULAR | Status: AC
  Administered 2020-03-28: 5 mg via INTRAVENOUS
  Filled 2020-03-28: qty 1

## 2020-03-28 MED ORDER — LAMOTRIGINE 100 MG PO TABS: 100.0000 mg | ORAL_TABLET | Freq: Every day | ORAL | Status: DC

## 2020-03-28 MED ORDER — POTASSIUM CHLORIDE 10 MEQ/100ML IV SOLN
10.0000 meq | INTRAVENOUS | Status: AC
  Administered 2020-03-28 (×2): 10 meq via INTRAVENOUS
  Filled 2020-03-28 (×2): qty 100

## 2020-03-28 MED ORDER — MAGNESIUM SULFATE 2 GM/50ML IV SOLN
2.0000 g | Freq: Once | INTRAVENOUS | Status: AC
  Administered 2020-03-28: 2 g via INTRAVENOUS
  Filled 2020-03-28: qty 50

## 2020-03-28 MED ORDER — THIAMINE HCL 100 MG/ML IJ SOLN
Freq: Once | INTRAVENOUS | Status: AC
  Filled 2020-03-28: qty 1000

## 2020-03-28 NOTE — Progress Notes (Signed)
Pt unable to answer questions on nursing admission hx at present time. Briscoe Burns BSN, RN-BC Admissions RN 03/28/2020 7:07 PM

## 2020-03-28 NOTE — ED Provider Notes (Signed)
[facility]-EMERGENCY DEPT Provider Note   CSN: [id number] Arrival date & time: 03/28/20  1607     History No chief complaint on file.   Tanya Simpson is a 59 y.o. female.  She has a psychiatric history.  Per EMS she was found by her husband lying on the floor unresponsive today.  Concerned she may have overdosed on lamotrigine and Abilify.  Also listed in patient's meds that are Haldol and Cogentin.  Level 5 caveat secondary to altered mental status.  The history is provided by the EMS personnel.  Altered Mental Status Presenting symptoms: unresponsiveness   Severity:  Severe Most recent episode:  Today Episode history:  Single Timing:  Constant Progression:  Unchanged Chronicity:  New Context: not taking medications as prescribed        Past Medical History:  Diagnosis Date  . Allergic rhinitis   . Anxiety   . Depression     Patient Active Problem List   Diagnosis Date Noted  . Bipolar disorder [facility])     Past Surgical History:  Procedure Laterality Date  . TONSILLECTOMY       OB History   No obstetric history on file.     Family History  Problem Relation Age of Onset  . Hypertension Mother   . Impulse control disorder Mother   . CVA Father   . Depression Father     Social History   Tobacco Use  . Smoking status: Former Smoker    Packs/day: 1.00    Years: 1.00    Pack years: 1.00  . Smokeless tobacco: Never Used  Vaping Use  . Vaping Use: Never used  Substance Use Topics  . Alcohol use: Yes    Alcohol/week: 1.0 standard drink    Types: 1 Glasses of wine per week    Comment: very little  . Drug use: No    Home Medications Prior to Admission medications   Medication Sig Start Date End Date Taking? Authorizing Provider  acetaminophen (TYLENOL) 325 MG tablet Take 650 mg by mouth every 6 (six) hours as needed for mild pain.    [provider]  ARIPiprazole (ABILIFY) 15 MG tablet Take 1.5 tabs po qd 03/07/20    Corie Chiquito, PMHNP  benztropine (COGENTIN) 1 MG tablet Take 1 tablet (1 mg total) by mouth daily. 11/09/19 12/09/19  Corie Chiquito, PMHNP  haloperidol (HALDOL) 5 MG tablet Take 1 tablet (5 mg total) by mouth 2 (two) times daily. 03/25/20   Corie Chiquito, PMHNP  lamoTRIgine (LAMICTAL) 100 MG tablet Take 1 tablet (100 mg total) by mouth at bedtime. 11/09/19 12/09/19  Corie Chiquito, PMHNP  loratadine (CLARITIN) 10 MG tablet Take 1 tablet (10 mg total) by mouth daily. 10/11/17 04/17/24  [name], [name], DO  Multiple Vitamin (MULTIVITAMIN) tablet Take 1 tablet by mouth every other day.    [provider]    Allergies    Patient has no known allergies.  Review of Systems   Review of Systems  Unable to perform ROS: Patient unresponsive    Physical Exam Updated Vital Signs BP (!) 149/70 (BP Location: Right Arm)   Pulse 89   Temp (!) 97.5 F (36.4 C)   Resp 19   SpO2 94%   Physical Exam Vitals and nursing note reviewed.  Constitutional:      General: She is not in acute distress.    Appearance: She is well-developed.  HENT:     Head: Normocephalic and atraumatic.  Eyes:  Conjunctiva/sclera: Conjunctivae normal.     Pupils: Pupils are equal, round, and reactive to light.     Comments: Pupils 3 mm.  Cardiovascular:     Rate and Rhythm: Normal rate and regular rhythm.     Heart sounds: No murmur heard.   Pulmonary:     Effort: Pulmonary effort is normal. No respiratory distress.     Breath sounds: Normal breath sounds.  Abdominal:     Palpations: Abdomen is soft.     Tenderness: There is no abdominal tenderness. There is no guarding or rebound.  Musculoskeletal:        General: No deformity or signs of injury. Normal range of motion.     Cervical back: Neck supple.  Skin:    General: Skin is warm and dry.     Capillary Refill: Capillary refill takes less than 2 seconds.  Neurological:     Mental Status: She is lethargic.     GCS: GCS eye subscore is 3. GCS verbal  subscore is 1. GCS motor subscore is 4.     Cranial Nerves: No facial asymmetry.     Sensory: Sensation is intact.     Motor: Motor function is intact.     Comments: Few beats of clonus bilaterally ankles.     ED Results / Procedures / Treatments   Labs (all labs ordered are listed, but only abnormal results are displayed) Labs Reviewed  COMPREHENSIVE METABOLIC PANEL - Abnormal; Notable for the following components:      Result Value   Sodium 133 (*)    Potassium 3.3 (*)    Glucose, Bld 166 (*)    Calcium 8.3 (*)    All other components within normal limits  CBC WITH DIFFERENTIAL/PLATELET - Abnormal; Notable for the following components:   WBC 25.5 (*)    Neutro Abs 23.7 (*)    Abs Immature Granulocytes 0.24 (*)    All other components within normal limits  ACETAMINOPHEN LEVEL - Abnormal; Notable for the following components:   Acetaminophen (Tylenol), Serum <10 (*)    All other components within normal limits  SALICYLATE LEVEL - Abnormal; Notable for the following components:   Salicylate Lvl <6.2 (*)    All other components within normal limits  URINALYSIS, ROUTINE W REFLEX MICROSCOPIC - Abnormal; Notable for the following components:   Color, Urine STRAW (*)    Glucose, UA >=500 (*)    Hgb urine dipstick SMALL (*)    All other components within normal limits  BLOOD GAS, VENOUS - Abnormal; Notable for the following components:   Acid-base deficit 2.1 (*)    All other components within normal limits  MAGNESIUM - Abnormal; Notable for the following components:   Magnesium 1.6 (*)    All other components within normal limits  COMPREHENSIVE METABOLIC PANEL - Abnormal; Notable for the following components:   Sodium 134 (*)    Potassium 3.3 (*)    CO2 17 (*)    Creatinine, Ser 0.38 (*)    Calcium 5.7 (*)    Total Protein 4.4 (*)    Albumin 2.5 (*)    All other components within normal limits  CBC - Abnormal; Notable for the following components:   WBC 14.3 (*)    RBC  3.15 (*)    Hemoglobin 10.0 (*)    HCT 31.5 (*)    All other components within normal limits  CBG MONITORING, ED - Abnormal; Notable for the following components:   Glucose-Capillary 28 (*)  All other components within normal limits  CBG MONITORING, ED - Abnormal; Notable for the following components:   Glucose-Capillary 227 (*)    All other components within normal limits  SARS CORONAVIRUS 2 (HOSPITAL ORDER, PERFORMED IN [facility])  ETHANOL  RAPID URINE DRUG SCREEN, HOSP PERFORMED  AMMONIA  HIV ANTIBODY (ROUTINE TESTING W REFLEX)  VITAMIN B12  VITAMIN B1  RPR  TSH  CALCIUM, IONIZED  TSH  LACTIC ACID, PLASMA  LACTIC ACID, PLASMA  BLOOD GAS, ARTERIAL  CBG MONITORING, ED    EKG EKG Interpretation  Date/Time:  Friday March 28 2020 16:38:27 EDT Ventricular Rate:  88 PR Interval:    QRS Duration: 87 QT Interval:  466 QTC Calculation: 564 R Axis:   39 Text Interpretation: Sinus rhythm Abnormal R-wave progression, early transition Borderline T abnormalities, diffuse leads Prolonged QT interval diffuse t wave flattening, doubt QTc as long as recorded Confirmed by Meridee Score [phone]) on 03/28/2020 4:42:36 PM   Radiology CT HEAD WO CONTRAST  Result Date: 03/28/2020 CLINICAL DATA:  Altered level of consciousness, possible overdose EXAM: CT HEAD WITHOUT CONTRAST TECHNIQUE: Contiguous axial images were obtained from the base of the skull through the vertex without intravenous contrast. COMPARISON:  None. FINDINGS: Brain: No acute infarct or hemorrhage. Lateral ventricles and midline structures are unremarkable. No acute extra-axial fluid collections. No mass effect. Vascular: No hyperdense vessel or unexpected calcification. Skull: Normal. Negative for fracture or focal lesion. Sinuses/Orbits: No acute finding. Other: None. IMPRESSION: 1. No acute intracranial process. Electronically Signed   By: Sharlet Salina M.D.   On: 03/28/2020 22:09   DG Chest Port 1  View  Result Date: 03/28/2020 CLINICAL DATA:  Altered mental status EXAM: PORTABLE CHEST 1 VIEW COMPARISON:  Portable exam 1736 hours compared to 01/31/2019 FINDINGS: Normal heart size, mediastinal contours, and pulmonary vascularity. Minimal bronchitic changes. Lungs otherwise clear. No pulmonary infiltrate, pleural effusion or pneumothorax. Osseous structures unremarkable. IMPRESSION: Minimal bronchitic changes without infiltrate. Electronically Signed   By: Ulyses Southward M.D.   On: 03/28/2020 18:00    Procedures .Critical Care Performed by: Terrilee Files, MD Authorized by: Terrilee Files, MD   Critical care provider statement:    Critical care time (minutes):  45   Critical care time was exclusive of:  Separately billable procedures and treating other patients   Critical care was necessary to treat or prevent imminent or life-threatening deterioration of the following conditions:  CNS failure or compromise, metabolic crisis and toxidrome   Critical care was time spent personally by me on the following activities:  Discussions with consultants, evaluation of patient's response to treatment, examination of patient, ordering and performing treatments and interventions, ordering and review of laboratory studies, ordering and review of radiographic studies, pulse oximetry, re-evaluation of patient's condition, obtaining history from patient or surrogate, review of old charts and development of treatment plan with patient or surrogate   I assumed direction of critical care for this patient from another provider in my specialty: no     (including critical care time)  Medications Ordered in ED Medications  magnesium sulfate IVPB 2 g 50 mL (2 g Intravenous New Bag/Given 03/28/20 1811)  potassium chloride 10 mEq in 100 mL IVPB (10 mEq Intravenous New Bag/Given 03/28/20 1801)  dextrose 50 % solution 50 mL (50 mLs Intravenous Given 03/28/20 1645)  sodium chloride 0.9 % bolus 1,000 mL (0 mLs  Intravenous Stopped 03/28/20 1805)    ED Course  I have reviewed the  triage vital signs and the nursing notes.  Pertinent labs & imaging results that were available during my care of the patient were reviewed by me and considered in my medical decision making (see chart for details).  Clinical Course as of Mar 29 1125  Fri Mar 28, 2020  1651 Fingerstick glucose of 28.  Amp of D50 given.  No significant change in mental status.   [MB]  1706 Recommendations from poison control are fluids cardiac monitoring 8 to 12 hours, benzos for seizure.  Concerns are CNS depression, seizure, conduction abnormalities, hypokalemia   [MB]  1826 Reassessed-patient still quite unresponsive and not following commands.  Does intermittently move around.  I spoke with her husband who said she has been increasingly paranoid and depressed after losing a job a month ago.  She thinks the computer is tracking her.  Last overdose was 5 years ago.   [MB]  1835 Discussed with [facility] hospitalist Dr. Shawnie Pons who will evaluate the patient for admission.   [MB]  2012 Patient somewhat more alert screaming. Looking around the room seeming to acknowledge a little bit of what is going on. Somewhat redirectable.   [MB]    Clinical Course User Index [MB] Terrilee Files, MD   MDM Rules/Calculators/A&P                         This patient complains of unresponsive episode possible overdose; this involves an extensive number of treatment Options and is a complaint that carries with it a high risk of complications and Morbidity. The differential includes polypharmacy overdose, metabolic derangement, bleed, section, stroke  I ordered, reviewed and interpreted labs, which included CBC with elevated white count, chemistry with low potassium low magnesium urine tox negative, Tylenol aspirin negative, presenting glucose low treated with D50 I ordered medication IV glucose, repletion of potassium and mag ACN, IV antipsychotic for  agitation I ordered imaging studies which included chest x-ray and I independently    visualized and interpreted imaging which showed no gross infiltrates Additional history obtained from patient's husband Previous records obtained and reviewed in epic, minimal info.  Husband states she was admitted for overdose back in [place]. I consulted [facility] hospitalist Dr. Shawnie Pons and discussed lab and imaging findings  Critical Interventions: Work-up and management of altered mental status  After the interventions stated above, I reevaluated the patient and found patient to be more awake although still not conversant or following commands.  Will need admission for further management.   Final Clinical Impression(s) / ED Diagnoses Final diagnoses:  Altered mental status  Medication overdose, intentional self-harm, initial encounter (HCC)  Hypokalemia  Hypomagnesemia    Rx / DC Orders ED Discharge Orders    None       Terrilee Files, MD 03/29/20 1130

## 2020-03-28 NOTE — ED Triage Notes (Signed)
Arrives via EMS from home, C/C overdose. Patient was found lying on the floor by husband unresponsive. Patient is alert to painful stimuli, non-verbal. Potentially took 1900 mg Lamotrigine, 225 Abilify, unsure if SI attempt or not, hx of SI attempts.   20 G LH CBG 218  HR 91 BP 153/80

## 2020-03-28 NOTE — H&P (Addendum)
History and Physical    Tanya Simpson KVQ:[id number] DOB: 1960-12-18 DOA: 03/28/2020  PCP: [name], [name], DO  Patient coming from: Home  I have personally briefly reviewed patient's old medical records in [facility]  Chief Complaint: unarousable  HPI: Tanya Simpson is a 59 y.o. female with medical history significant of bipolar disease, schizophrenia who has a previous history of overdose approximately 7 years ago with a 32-month inpatient stay at that time.  The patient is unable to give any history so this history was obtained by asking her husband.  He reports increasing paranoia over the last few weeks since her medications have been changed by her primary psychiatrist.  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  He tried calling her around noon and then again at 1230 with no response.  He was concerned enough to leave his work and go home where he found her unresponsive on her abdomen.  He called EMS and when she was brought to the ED.  She was presumed to have overdosed because she was altered in the ED though all tests and work-up was negative.   ED Course:  She was noted to have a prolonged QTC and elevated white count, and a low magnesium and potassium level.  She had a negative urine drug screen.  There was no acidemia noted.  She had a negative salicylate, acetaminophen, and ammonia level.  Her ethanol was also negative as well as a UDS.  Initially the patient was unresponsive but was moving all extremities.  She had a low CBG of 28 and was given an amp of D50 and an IVC was placed by the ED.  She then began to wake up with screaming out, pulled out her IV, and was given Haldol IV by the ER physician.  This lasted about 30 minutes and then she woke up and began screaming and striking out at staff.  After conversation with poison control and pharmacy we decided to give her Ativan which would not continue to prolong her  QT.  Review of Systems: As per HPI otherwise 10 point review of systems negative.   Past Medical History:  Diagnosis Date   Allergic rhinitis    Anxiety    Depression    Schizophrenia (HCC)     Past Surgical History:  Procedure Laterality Date   TONSILLECTOMY       reports that she has quit smoking. She has a 1.00 pack-year smoking history. She has never used smokeless tobacco. She reports current alcohol use of about 1.0 standard drink of alcohol per week. She reports that she does not use drugs.  No Known Allergies  Family History  Problem Relation Age of Onset   Hypertension Mother    Impulse control disorder Mother    CVA Father    Depression Father      Prior to Admission medications   Medication Sig Start Date End Date Taking? Authorizing Provider  acetaminophen (TYLENOL) 325 MG tablet Take 650 mg by mouth every 6 (six) hours as needed for mild pain.    [provider]  ARIPiprazole (ABILIFY) 15 MG tablet Take 1.5 tabs po qd 03/07/20   Corie Chiquito, PMHNP  benztropine (COGENTIN) 1 MG tablet Take 1 tablet (1 mg total) by mouth daily. 11/09/19 12/09/19  Corie Chiquito, PMHNP  haloperidol (HALDOL) 5 MG tablet Take 1 tablet (5 mg total) by mouth 2 (two) times daily. 03/25/20  Corie Chiquito, PMHNP  lamoTRIgine (LAMICTAL) 100 MG tablet Take 1 tablet (100 mg total) by mouth at bedtime. 11/09/19 12/09/19  Corie Chiquito, PMHNP  loratadine (CLARITIN) 10 MG tablet Take 1 tablet (10 mg total) by mouth daily. 10/11/17 04/17/24  [name], [name], DO  Multiple Vitamin (MULTIVITAMIN) tablet Take 1 tablet by mouth every other day.    [provider]    Physical Exam: Vitals:   03/28/20 1730 03/28/20 1745 03/28/20 1800 03/28/20 1928  BP: (!) 160/74 139/69 (!) 151/78 138/71  Pulse: 87 90 96 89  Resp: 19 18 14 16   Temp:    97.8 F (36.6 C)  TempSrc:    Oral  SpO2: 100% 100% 100% 95%    Constitutional: NAD, responds to voice, does not open her eyes. Neck:  normal, supple, no masses, no thyromegaly Respiratory: clear to auscultation bilaterally, no wheezing, no crackles. Normal respiratory effort. No accessory muscle use.  Cardiovascular: Regular rate and rhythm, no murmurs / rubs / gallops. No extremity edema. 2+ pedal pulses. No carotid bruits.  Abdomen: no tenderness, no masses palpated. No hepatosplenomegaly. Bowel sounds positive.  Musculoskeletal: no clubbing / cyanosis. No joint deformity upper and lower extremities. Good ROM, no contractures. Normal muscle tone.  Skin: no rashes, lesions, ulcers. No induration Neurologic: Moves all extremities, has 4 beats of clonus on the left and 2 beats of clonus on the right Psychiatric: Unable to assess  Labs on Admission: I have personally reviewed following labs and imaging studies  CBC: Recent Labs  Lab 03/26/20 1226 03/28/20 1646  WBC 8.4 25.5*  NEUTROABS  --  23.7*  HGB 13.5 12.5  HCT 40.6 37.8  MCV 91 95.2  PLT 399 332   Basic Metabolic Panel: Recent Labs  Lab 03/26/20 1226 03/28/20 1643 03/28/20 1646  NA 136  --  133*  K 4.8  --  3.3*  CL 99  --  98  CO2 24  --  25  GLUCOSE 87  --  166*  BUN 10  --  12  CREATININE 0.79  --  0.64  CALCIUM 9.8  --  8.3*  MG  --  1.6*  --    GFR: CrCl cannot be calculated (Unknown ideal weight.). Liver Function Tests: Recent Labs  Lab 03/26/20 1226 03/28/20 1646  AST 22 22  ALT 24 21  ALKPHOS 86 58  BILITOT 0.3 0.4  PROT 7.7 7.1  ALBUMIN 4.7 4.1    Recent Labs  Lab 03/28/20 1713  AMMONIA 26   CBG: Recent Labs  Lab 03/28/20 1642 03/28/20 1708  GLUCAP 28* 227*   Lipid Profile: Recent Labs    03/26/20 1226  CHOL 185  HDL 61  LDLCALC 107*  TRIG 92  CHOLHDL 3.0   Urine analysis:    Component Value Date/Time   COLORURINE STRAW (A) 03/28/2020 1758   APPEARANCEUR CLEAR 03/28/2020 1758   LABSPEC 1.006 03/28/2020 1758   PHURINE 7.0 03/28/2020 1758   GLUCOSEU >=500 (A) 03/28/2020 1758   HGBUR SMALL (A) 03/28/2020  1758   BILIRUBINUR NEGATIVE 03/28/2020 1758   KETONESUR NEGATIVE 03/28/2020 1758   PROTEINUR NEGATIVE 03/28/2020 1758   NITRITE NEGATIVE 03/28/2020 1758   LEUKOCYTESUR NEGATIVE 03/28/2020 1758    Radiological Exams on Admission: DG Chest Port 1 View  Result Date: 03/28/2020 CLINICAL DATA:  Altered mental status EXAM: PORTABLE CHEST 1 VIEW COMPARISON:  Portable exam 1736 hours compared to 01/31/2019 FINDINGS: Normal heart size, mediastinal contours, and pulmonary vascularity. Minimal bronchitic changes. Lungs  otherwise clear. No pulmonary infiltrate, pleural effusion or pneumothorax. Osseous structures unremarkable. IMPRESSION: Minimal bronchitic changes without infiltrate. Electronically Signed   By: Lavonia Dana M.D.   On: 03/28/2020 18:00    EKG: Independently reviewed.  Sinus rhythm prolonged QT  Assessment/Plan Principal Problem:   Altered mental status Active Problems:   Bipolar disorder (HCC)   Overdose, undetermined intent, initial encounter  Altered mental status presumed overdose given history and negative work-up including negative CT of the head.  Check B12, thiamine, RPR, TSH  Overdose presumed, IVC in place, suicide precautions, sitter, resume home psych meds which include Lamictal, Haldol, Abilify once able to take p.o. psych consult in a.m.  Prolonged QT repeat EKG correct magnesium and potassium  Hypomagnesemia status post 2 g IV  Hypokalemia status post KCl 10 mEq IV x4  Hypoglycemia status post 1 amp D50 with subsequent hyperglycemia, trend  DVT prophylaxis: Lovenox SQ Code Status: Full code  Family Communication: Husband by phone Disposition Plan: Inpatient psych Consults called: Will need psychiatry in the a.m. Admission status: Inpatient   Donnamae Jude MD [facility]  If 7PM-7AM, please contact night-coverage 03/28/2020, 8:16 PM

## 2020-03-28 NOTE — ED Notes (Signed)
Poison control recommends repeat  EKG. 

## 2020-03-28 NOTE — ED Notes (Signed)
[phone],David Broadnax, husband would like an update on his wife.

## 2020-03-28 NOTE — ED Notes (Signed)
[employer] served patient at 655pm

## 2020-03-28 NOTE — ED Notes (Signed)
CBG 28 

## 2020-03-28 NOTE — Telephone Encounter (Signed)
Husband, [name], just called and reported that he came home and found Tanya Simpson past out and unresponsive. He call 911 and they are taking her to [facility].  He doesn't know what happened.  Did she take pills or is she reacting to the medication?  She has been very depressed for over a month.  He feels he is the blame for everything.  She has been hospitalized several times in a short period.  But treatment just doesn't seem to be working. Just wanted you to aware of what has happended.  If you want to call him for information you can.  [phone]

## 2020-03-28 NOTE — ED Notes (Addendum)
Called poison control, spoke with Rose. She said pt has potential for CNS depression, seizure, conduction disturbances, and hypokalemia. They recommend an EKG, CMP, acetaminophen level, ETOH level, and observation for 8-12 hours. Treat tachycardia with fluids and give benzodiazapine for seizure.

## 2020-03-28 NOTE — ED Notes (Signed)
Patient IVC'd by DR. Charm Barges, waiting for [employer] to serve patient with Findings and Custody Order

## 2020-03-28 NOTE — ED Notes (Signed)
Pt's medications put in belonging bag and placed at [facility]

## 2020-03-29 ENCOUNTER — Other Ambulatory Visit: Payer: Self-pay

## 2020-03-29 DIAGNOSIS — E872 Acidosis: Secondary | ICD-10-CM

## 2020-03-29 DIAGNOSIS — R338 Other retention of urine: Secondary | ICD-10-CM

## 2020-03-29 DIAGNOSIS — F310 Bipolar disorder, current episode hypomanic: Secondary | ICD-10-CM

## 2020-03-29 DIAGNOSIS — T50904A Poisoning by unspecified drugs, medicaments and biological substances, undetermined, initial encounter: Secondary | ICD-10-CM

## 2020-03-29 LAB — CBC
HCT: 31.5 % — ABNORMAL LOW (ref 36.0–46.0)
Hemoglobin: 10 g/dL — ABNORMAL LOW (ref 12.0–15.0)
MCH: 31.7 pg (ref 26.0–34.0)
MCHC: 31.7 g/dL (ref 30.0–36.0)
MCV: 100 fL (ref 80.0–100.0)
Platelets: 222 10*3/uL (ref 150–400)
RBC: 3.15 MIL/uL — ABNORMAL LOW (ref 3.87–5.11)
RDW: 12.4 % (ref 11.5–15.5)
WBC: 14.3 10*3/uL — ABNORMAL HIGH (ref 4.0–10.5)
nRBC: 0 % (ref 0.0–0.2)

## 2020-03-29 LAB — COMPREHENSIVE METABOLIC PANEL
ALT: 15 U/L (ref 0–44)
AST: 15 U/L (ref 15–41)
Albumin: 2.5 g/dL — ABNORMAL LOW (ref 3.5–5.0)
Alkaline Phosphatase: 38 U/L (ref 38–126)
Anion gap: 6 (ref 5–15)
BUN: 6 mg/dL (ref 6–20)
CO2: 17 mmol/L — ABNORMAL LOW (ref 22–32)
Calcium: 5.7 mg/dL — CL (ref 8.9–10.3)
Chloride: 111 mmol/L (ref 98–111)
Creatinine, Ser: 0.38 mg/dL — ABNORMAL LOW (ref 0.44–1.00)
GFR calc Af Amer: >60 mL/min (ref >60)
GFR calc non Af Amer: >60 mL/min (ref >60)
Glucose, Bld: 83 mg/dL (ref 70–99)
Potassium: 3.3 mmol/L — ABNORMAL LOW (ref 3.5–5.1)
Sodium: 134 mmol/L — ABNORMAL LOW (ref 135–145)
Total Bilirubin: 0.5 mg/dL (ref 0.3–1.2)
Total Protein: 4.4 g/dL — ABNORMAL LOW (ref 6.5–8.1)

## 2020-03-29 LAB — BLOOD GAS, ARTERIAL
Acid-Base Excess: 1.1 mmol/L (ref 0.0–2.0)
Bicarbonate: 25.8 mmol/L (ref 20.0–28.0)
O2 Saturation: 90.8 %
Patient temperature: 98.6
pCO2 arterial: 43.9 mmHg (ref 32.0–48.0)
pH, Arterial: 7.387 (ref 7.350–7.450)
pO2, Arterial: 59.4 mmHg — ABNORMAL LOW (ref 83.0–108.0)

## 2020-03-29 LAB — GLUCOSE, CAPILLARY
Glucose-Capillary: 92 mg/dL (ref 70–99)
Glucose-Capillary: 86 mg/dL (ref 70–99)

## 2020-03-29 LAB — LACTIC ACID, PLASMA
Lactic Acid, Venous: 1.1 mmol/L (ref 0.5–1.9)
Lactic Acid, Venous: 1.9 mmol/L (ref 0.5–1.9)

## 2020-03-29 LAB — HIV ANTIBODY (ROUTINE TESTING W REFLEX): HIV Screen 4th Generation wRfx: NONREACTIVE

## 2020-03-29 LAB — VITAMIN B12: Vitamin B-12: 359 pg/mL (ref 180–914)

## 2020-03-29 LAB — TSH: TSH: 0.548 u[IU]/mL (ref 0.350–4.500)

## 2020-03-29 LAB — CBG MONITORING, ED: Glucose-Capillary: 81 mg/dL (ref 70–99)

## 2020-03-29 MED ORDER — IPRATROPIUM-ALBUTEROL 0.5-2.5 (3) MG/3ML IN SOLN: 3.0000 mL | RESPIRATORY_TRACT | Status: DC | PRN

## 2020-03-29 MED ORDER — POTASSIUM CHLORIDE 10 MEQ/100ML IV SOLN
10.0000 meq | INTRAVENOUS | Status: AC
  Administered 2020-03-29 (×2): 10 meq via INTRAVENOUS
  Filled 2020-03-29 (×2): qty 100

## 2020-03-29 MED ORDER — LORAZEPAM 2 MG/ML IJ SOLN
2.0000 mg | INTRAMUSCULAR | Status: DC | PRN
  Administered 2020-03-29 (×3): 2 mg via INTRAVENOUS
  Filled 2020-03-29 (×3): qty 1

## 2020-03-29 MED ORDER — CALCIUM GLUCONATE-NACL 2-0.675 GM/100ML-% IV SOLN
2.0000 g | Freq: Once | INTRAVENOUS | Status: AC
  Administered 2020-03-29: 2000 mg via INTRAVENOUS
  Filled 2020-03-29: qty 100

## 2020-03-29 MED ORDER — SODIUM CHLORIDE 0.9 % IV SOLN: INTRAVENOUS | Status: DC

## 2020-03-29 MED ORDER — SENNOSIDES-DOCUSATE SODIUM 8.6-50 MG PO TABS: 2.0000 | ORAL_TABLET | Freq: Every evening | ORAL | Status: DC | PRN

## 2020-03-29 MED ORDER — POLYETHYLENE GLYCOL 3350 17 G PO PACK: 17.0000 g | PACK | Freq: Every day | ORAL | Status: DC | PRN

## 2020-03-29 MED ORDER — POTASSIUM CHLORIDE 10 MEQ/100ML IV SOLN
10.0000 meq | INTRAVENOUS | Status: AC
  Administered 2020-03-29: 10 meq via INTRAVENOUS
  Filled 2020-03-29: qty 100

## 2020-03-29 MED ORDER — DEXTROSE-NACL 5-0.45 % IV SOLN: INTRAVENOUS | Status: AC

## 2020-03-29 NOTE — ED Notes (Signed)
Pt is an [employer] transport due to infusions, delay in transport

## 2020-03-29 NOTE — ED Notes (Signed)
Patty, RN, from [employer] that we can give Ativan every 2 hrs PRN for agitation
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
## 2020-03-29 NOTE — ED Notes (Signed)
Pt continues to scream, cry and fight restraints. Ativan given for agitation as pt is not able to be redirected

## 2020-03-29 NOTE — Progress Notes (Signed)
Attempted to assess in the ED earlier with no success as she was very agitated.  This provider will continue to visit.  Based on notes, appears she will need psychiatric inpatient hospitalization when medically cleared, TOC consult placed.  Nanine Means, PMHNP

## 2020-03-29 NOTE — ED Notes (Signed)
CBG 81.  

## 2020-03-29 NOTE — Progress Notes (Signed)
Lab notified of ABG sent down from ED.

## 2020-03-29 NOTE — Progress Notes (Signed)
Patient transferred from ED to 5W Room 1528. Patient is currently in YELLOW MEWS due to LOC. Patient's LOC baseline is responds to pain. Patient moans and groans and is unable to respond to questions about her health.

## 2020-03-29 NOTE — Progress Notes (Signed)
Patient is awake and communicating verbally. Speech is mixed with crying as patient verbalizes desire to ambulate to bathroom. Wrist restraints assessed and removed after patient verbally agrees she will not attempt to leave bed or pull out IV. Lorazepam 2 mg/ml given IV as ordered. Patient educated about Purewick. Patient's understanding that she has on an external catheter is unclear. Reassured patient that she would not be harmed and staff was here to help her be comfortable and safe.

## 2020-03-30 DIAGNOSIS — F314 Bipolar disorder, current episode depressed, severe, without psychotic features: Secondary | ICD-10-CM | POA: Diagnosis present

## 2020-03-30 LAB — COMPREHENSIVE METABOLIC PANEL
ALT: 20 U/L (ref 0–44)
AST: 23 U/L (ref 15–41)
Albumin: 3.5 g/dL (ref 3.5–5.0)
Alkaline Phosphatase: 61 U/L (ref 38–126)
Anion gap: 10 (ref 5–15)
BUN: 6 mg/dL (ref 6–20)
CO2: 24 mmol/L (ref 22–32)
Calcium: 8.4 mg/dL — ABNORMAL LOW (ref 8.9–10.3)
Chloride: 100 mmol/L (ref 98–111)
Creatinine, Ser: 0.63 mg/dL (ref 0.44–1.00)
GFR calc Af Amer: >60 mL/min (ref >60)
GFR calc non Af Amer: >60 mL/min (ref >60)
Glucose, Bld: 91 mg/dL (ref 70–99)
Potassium: 3.8 mmol/L (ref 3.5–5.1)
Sodium: 134 mmol/L — ABNORMAL LOW (ref 135–145)
Total Bilirubin: 0.5 mg/dL (ref 0.3–1.2)
Total Protein: 6.3 g/dL — ABNORMAL LOW (ref 6.5–8.1)

## 2020-03-30 LAB — GLUCOSE, CAPILLARY
Glucose-Capillary: 106 mg/dL — ABNORMAL HIGH (ref 70–99)
Glucose-Capillary: 106 mg/dL — ABNORMAL HIGH (ref 70–99)
Glucose-Capillary: 107 mg/dL — ABNORMAL HIGH (ref 70–99)
Glucose-Capillary: 107 mg/dL — ABNORMAL HIGH (ref 70–99)
Glucose-Capillary: 107 mg/dL — ABNORMAL HIGH (ref 70–99)
Glucose-Capillary: 123 mg/dL — ABNORMAL HIGH (ref 70–99)
Glucose-Capillary: 96 mg/dL (ref 70–99)

## 2020-03-30 LAB — MAGNESIUM: Magnesium: 1.9 mg/dL (ref 1.7–2.4)

## 2020-03-30 LAB — CBC
HCT: 33 % — ABNORMAL LOW (ref 36.0–46.0)
Hemoglobin: 11.2 g/dL — ABNORMAL LOW (ref 12.0–15.0)
MCH: 31.8 pg (ref 26.0–34.0)
MCHC: 33.9 g/dL (ref 30.0–36.0)
MCV: 93.8 fL (ref 80.0–100.0)
Platelets: 317 10*3/uL (ref 150–400)
RBC: 3.52 MIL/uL — ABNORMAL LOW (ref 3.87–5.11)
RDW: 12.6 % (ref 11.5–15.5)
WBC: 13.8 10*3/uL — ABNORMAL HIGH (ref 4.0–10.5)
nRBC: 0 % (ref 0.0–0.2)

## 2020-03-30 LAB — RPR: RPR Ser Ql: NONREACTIVE

## 2020-03-30 MED ORDER — DEXTROSE-NACL 5-0.45 % IV SOLN: INTRAVENOUS | Status: AC

## 2020-03-30 NOTE — Progress Notes (Signed)
Patient's left hand is swollen and edematous. Restricted extremity band placed. Charge nurse made aware and assessed. Hand elevated on pillow. Will continue to monitor.

## 2020-03-30 NOTE — Progress Notes (Signed)
   03/29/20 2030  Assess: MEWS Score  Temp 99 F (37.2 C)  BP (!) 142/62  Pulse Rate 97  Resp 16  SpO2 96 %  O2 Device Room Air  Assess: MEWS Score  MEWS Temp 0  MEWS Systolic 0  MEWS Pulse 0  MEWS RR 0  MEWS LOC 0  MEWS Score 0  MEWS Score Color Green   Nurse to monitor patient's vitals and continue with the yellow MEWS protocol.

## 2020-03-30 NOTE — Progress Notes (Addendum)
Patient had medications in her personal belongings. Inventory as follows:  Haldol 5 mg - count 79.5 // filled 02/10/20 - qty. 90 Benztropine 1 mg - count 45 // filled 03/18/20 - qty. 30 Lamotrigine 100 mg - count 1 // filled 03/18/20 - qty. 30 Aripiprazole 15 mg - count 7 // filled 03/07/20 - qty. 45  Medications delivered to pharmacy.   Addendum created to correct Lamotrigine count from 30 to 1.

## 2020-03-30 NOTE — Consult Note (Signed)
[facility] Face-to-Face Psychiatry Consult   Reason for Consult:  Overdose Referring Physician:  Dr Nelson Chimes Patient Identification: Tanya Simpson MRN:  [id number] Principal Diagnosis: Altered mental status Diagnosis:  Principal Problem:   Altered mental status Active Problems:   Bipolar affective disorder, depressed, severe (HCC)   Overdose, undetermined intent, initial encounter  Total Time spent with patient: 1 hour  Subjective:  Patient seen and evaluated in person. Tanya Simpson is a 59 y.o. female patient admitted with overdose. Patient states that she "took all of her pills" and that "I guess I am not very good at it", as she was not successful in completion of suicide. Patient states that she has a history of bipolar disoreder, "for many years", but can not recall date of diagnosis. She states that she is currently followed by Dr. Corie Chiquito and has been taking a regimen of Lamictal, Abilify, Haldol, Cogentin (unknown dosage); she states that Dr. Montez Morita has been trying to decrease her Haldol as she was experiencing side effects (experienced TD April of 2021). Patient endorses increased situational stress in the form recent job loss (she was a Conservation officer, nature at [employer]) and marital discord. She reports that her husband is "very controlling" but denies any history of physical abuse. Of note, patient reports that her first husband completed suicide by hanging in 1995 and that she was a single mother to 2 young children until she married her second husband in 2005. She also endorses a strained relationship with her mother, who lives in [place]. She also reports a strained relationship with her adult daughter, age 69, who also lives in [place]. She denies having any social support in the form of friends or family here in [place]. She endorses previous suicide attempts, most recently before this admission in 2013 also by taking an overdose of her prescription medications. She also endorses a history of multiple  psychiatric-related hospitalizations "over the years", but can not recall the dates.      HPI per MD:   Tanya Simpson is a 59 y.o. female.  She has a psychiatric history.  Per EMS she was found by her husband lying on the floor unresponsive today.  Concerned she may have overdosed on lamotrigine and Abilify.  Also listed in patient's meds that are Haldol and Cogentin.  Level 5 caveat secondary to altered mental status.   Tanya Simpson is a 60 y.o. female with medical history significant of bipolar disease, schizophrenia who has a previous history of overdose approximately 7 years ago with a 32-month inpatient stay at that time.  The patient is unable to give any history so this history was obtained by asking her husband.  He reports increasing paranoia over the last few weeks since her medications have been changed by her primary psychiatrist.  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  He tried calling her around noon and then again at 1230 with no response.  He was concerned enough to leave his work and go home where he found her unresponsive on her abdomen.  He called EMS and when she was brought to the ED.  She was presumed to have overdosed because she was altered in the ED though all tests and work-up was negative.   ED Course:  She was noted to have a prolonged QTC and elevated white count, and a low magnesium and potassium level.  She had a negative urine drug screen.  There was  no acidemia noted.  She had a negative salicylate, acetaminophen, and ammonia level.  Her ethanol was also negative as well as a UDS.  Initially the patient was unresponsive but was moving all extremities.  She had a low CBG of 28 and was given an amp of D50 and an IVC was placed by the ED.  She then began to wake up with screaming out, pulled out her IV, and was given Haldol IV by the ER physician.  This lasted about 30 minutes and then she woke up and began screaming  and striking out at staff.  After conversation with poison control and pharmacy we decided to give her Ativan which would not continue to prolong her QT.  Past Psychiatric History: bipolar d/o  Risk to Self:  yes Risk to Others:  none Prior Inpatient Therapy:  yes Prior Outpatient Therapy:  yes  Past Medical History:  Past Medical History:  Diagnosis Date   Allergic rhinitis    Anxiety    Depression    Schizophrenia (HCC)     Past Surgical History:  Procedure Laterality Date   TONSILLECTOMY     Family History:  Family History  Problem Relation Age of Onset   Hypertension Mother    Impulse control disorder Mother    CVA Father    Depression Father    Family Psychiatric  History: see above Social History:  Social History   Substance and Sexual Activity  Alcohol Use Yes   Alcohol/week: 1.0 standard drink   Types: 1 Glasses of wine per week   Comment: very little     Social History   Substance and Sexual Activity  Drug Use No    Social History   Socioeconomic History   Marital status: Married    Spouse name: [name]   Number of children: 2   Years of education: 16   Highest education level: Bachelor's degree (e.g., BA, AB, BS)  Occupational History   Occupation: [employer]  Tobacco Use   Smoking status: Former Smoker    Packs/day: 1.00    Years: 1.00    Pack years: 1.00   Smokeless tobacco: Never Used  Building services engineer Use: Never used  Substance and Sexual Activity   Alcohol use: Yes    Alcohol/week: 1.0 standard drink    Types: 1 Glasses of wine per week    Comment: very little   Drug use: No   Sexual activity: Yes    Partners: Male  Other Topics Concern   Not on file  Social History Narrative   Patient lives with her husband here in [place], his name is [name].    Patient works as a Holiday representative at [employer].    Patient is recently from [place].       Hopeful to get back to exercising once the weather cools down.     She was participating in [employer], however the pandemic has made this difficult for her.    Social Determinants of Health   Financial Resource Strain: Low Risk    Difficulty of Paying Living Expenses: Not hard at all  Food Insecurity: Food Insecurity Present   Worried About Programme researcher, broadcasting/film/video in the Last Year: Sometimes true   Barista in the Last Year: Sometimes true  Transportation Needs: No Transportation Needs   Lack of Transportation (Medical): No   Lack of Transportation (Non-Medical): No  Physical Activity: Insufficiently Active   Days of Exercise per Week:  7 days   Minutes of Exercise per Session: 10 min  Stress: Stress Concern Present   Feeling of Stress : To some extent  Social Connections: Moderately Isolated   Frequency of Communication with Friends and Family: Once a week   Frequency of Social Gatherings with Friends and Family: Never   Attends Religious Services: More than 4 times per year   Active Member of [employer] or Organizations: No   Attends Banker Meetings: Never   Marital Status: Married   Additional Social History:    Allergies:  No Known Allergies  Labs:  Results for orders placed or performed during the hospital encounter of 03/28/20 (from the past 48 hour(s))  Ethanol     Status: None   Collection Time: 03/28/20  4:23 PM  Result Value Ref Range   Alcohol, Ethyl (B) <10 <10 mg/dL    Comment: (NOTE) Lowest detectable limit for serum alcohol is 10 mg/dL.  For medical purposes only. Performed at [facility], [address], [place], [place] [address]   Acetaminophen level     Status: Abnormal   Collection Time: 03/28/20  4:23 PM  Result Value Ref Range   Acetaminophen (Tylenol), Serum <10 (L) 10 - 30 ug/mL    Comment: (NOTE) Therapeutic concentrations vary significantly. A range of 10-30 ug/mL  may be an effective concentration for many patients. However, some  are best treated at  concentrations outside of this range. Acetaminophen concentrations >150 ug/mL at 4 hours after ingestion  and >50 ug/mL at 12 hours after ingestion are often associated with  toxic reactions.  Performed at [facility], [address] [place], [place] [address]   Salicylate level     Status: Abnormal   Collection Time: 03/28/20  4:23 PM  Result Value Ref Range   Salicylate Lvl <7.0 (L) 7.0 - 30.0 mg/dL    Comment: Performed at [facility], [address], [place], [place] [address]  CBG monitoring, ED     Status: Abnormal   Collection Time: 03/28/20  4:42 PM  Result Value Ref Range   Glucose-Capillary 28 (LL) 70 - 99 mg/dL    Comment: Glucose reference range applies only to samples taken after fasting for at least 8 hours.   Comment 1 Notify RN   Magnesium     Status: Abnormal   Collection Time: 03/28/20  4:43 PM  Result Value Ref Range   Magnesium 1.6 (L) 1.7 - 2.4 mg/dL    Comment: Performed at [facility], [address], [place], [place] [address]  Comprehensive metabolic panel     Status: Abnormal   Collection Time: 03/28/20  4:46 PM  Result Value Ref Range   Sodium 133 (L) 135 - 145 mmol/L   Potassium 3.3 (L) 3.5 - 5.1 mmol/L   Chloride 98 98 - 111 mmol/L   CO2 25 22 - 32 mmol/L   Glucose, Bld 166 (H) 70 - 99 mg/dL    Comment: Glucose reference range applies only to samples taken after fasting for at least 8 hours.   BUN 12 6 - 20 mg/dL   Creatinine, Ser 2.95 0.44 - 1.00 mg/dL   Calcium 8.3 (L) 8.9 - 10.3 mg/dL   Total Protein 7.1 6.5 - 8.1 g/dL   Albumin 4.1 3.5 - 5.0 g/dL   AST 22 15 - 41 U/L   ALT 21 0 - 44 U/L   Alkaline Phosphatase 58 38 - 126 U/L  Total Bilirubin 0.4 0.3 - 1.2 mg/dL   GFR calc non Af Amer >60 >60 mL/min   GFR calc Af Amer >60 >60 mL/min   Anion gap 10 5 - 15    Comment: Performed at [facility], [address] [place], [place] [address]  CBC with Diff      Status: Abnormal   Collection Time: 03/28/20  4:46 PM  Result Value Ref Range   WBC 25.5 (H) 4.0 - 10.5 K/uL   RBC 3.97 3.87 - 5.11 MIL/uL   Hemoglobin 12.5 12.0 - 15.0 g/dL   HCT 83.4 36 - 46 %   MCV 95.2 80.0 - 100.0 fL   MCH 31.5 26.0 - 34.0 pg   MCHC 33.1 30.0 - 36.0 g/dL   RDW 19.6 22.2 - 97.9 %   Platelets 332 150 - 400 K/uL   nRBC 0.0 0.0 - 0.2 %   Neutrophils Relative % 93 %   Neutro Abs 23.7 (H) 1.7 - 7.7 K/uL   Lymphocytes Relative 3 %   Lymphs Abs 0.8 0.7 - 4.0 K/uL   Monocytes Relative 3 %   Monocytes Absolute 0.8 0 - 1 K/uL   Eosinophils Relative 0 %   Eosinophils Absolute 0.0 0 - 0 K/uL   Basophils Relative 0 %   Basophils Absolute 0.0 0 - 0 K/uL   Immature Granulocytes 1 %   Abs Immature Granulocytes 0.24 (H) 0.00 - 0.07 K/uL    Comment: Performed at [facility], [address] [place], [place] [address]  Blood gas, venous     Status: Abnormal   Collection Time: 03/28/20  5:08 PM  Result Value Ref Range   FIO2 21.00    pH, Ven 7.286 7.25 - 7.43   pCO2, Ven 53.0 44 - 60 mmHg   pO2, Ven 33.3 32 - 45 mmHg   Bicarbonate 24.5 20.0 - 28.0 mmol/L   Acid-base deficit 2.1 (H) 0.0 - 2.0 mmol/L   O2 Saturation 50.4 %   Patient temperature 98.6     Comment: Performed at [facility], [address], [place], [place] [address]  CBG monitoring, ED     Status: Abnormal   Collection Time: 03/28/20  5:08 PM  Result Value Ref Range   Glucose-Capillary 227 (H) 70 - 99 mg/dL    Comment: Glucose reference range applies only to samples taken after fasting for at least 8 hours.  Ammonia     Status: None   Collection Time: 03/28/20  5:13 PM  Result Value Ref Range   Ammonia 26 9 - 35 umol/L    Comment: Performed at [facility], [address], [place], [place] [address]  SARS Coronavirus 2 by RT PCR (hospital order, performed in [facility] hospital lab)     Status: None   Collection Time: 03/28/20  5:19 PM  Result  Value Ref Range   SARS Coronavirus 2 NEGATIVE NEGATIVE    Comment: (NOTE) SARS-CoV-2 target nucleic acids are NOT DETECTED.  The SARS-CoV-2 RNA is generally detectable in upper and lower respiratory specimens during the acute phase of infection. The lowest concentration of SARS-CoV-2 viral copies this assay can detect is 250 copies / mL. A negative result does not preclude SARS-CoV-2 infection and should not be used as the sole basis for treatment or other patient management decisions.  A negative result may occur with improper specimen collection / handling, submission of specimen other than nasopharyngeal swab, presence of viral  mutation(s) within the areas targeted by this assay, and inadequate number of viral copies (<250 copies / mL). A negative result must be combined with clinical observations, patient history, and epidemiological information.  Fact Sheet for Patients:   BoilerBrush.com.cyhttps://www.fda.gov/media/136312/download  Fact Sheet for Healthcare Providers: https://pope.com/https://www.fda.gov/media/136313/download  This test is not yet approved or  cleared by the [place] FDA and has been authorized for detection and/or diagnosis of SARS-CoV-2 by FDA under an Emergency Use Authorization (EUA).  This EUA will remain in effect (meaning this test can be used) for the duration of the COVID-19 declaration under Section 564(b)(1) of the Act, 21 U.S.C. section 360bbb-3(b)(1), unless the authorization is terminated or revoked sooner.  Performed at [facility], [address] [place], [place] [address]   Urine rapid drug screen (hosp performed)     Status: None   Collection Time: 03/28/20  5:58 PM  Result Value Ref Range   Opiates NONE DETECTED NONE DETECTED   Cocaine NONE DETECTED NONE DETECTED   Benzodiazepines NONE DETECTED NONE DETECTED   Amphetamines NONE DETECTED NONE DETECTED   Tetrahydrocannabinol NONE DETECTED NONE DETECTED   Barbiturates NONE DETECTED NONE DETECTED     Comment: (NOTE) DRUG SCREEN FOR MEDICAL PURPOSES ONLY.  IF CONFIRMATION IS NEEDED FOR ANY PURPOSE, NOTIFY LAB WITHIN 5 DAYS.  LOWEST DETECTABLE LIMITS FOR URINE DRUG SCREEN Drug Class                     Cutoff (ng/mL) Amphetamine and metabolites    1000 Barbiturate and metabolites    200 Benzodiazepine                 200 Tricyclics and metabolites     300 Opiates and metabolites        300 Cocaine and metabolites        300 THC                            50 Performed at [facility], [address] [place], [place] [address]   Urinalysis, Routine w reflex microscopic     Status: Abnormal   Collection Time: 03/28/20  5:58 PM  Result Value Ref Range   Color, Urine STRAW (A) YELLOW   APPearance CLEAR CLEAR   Specific Gravity, Urine 1.006 1.005 - 1.030   pH 7.0 5.0 - 8.0   Glucose, UA >=500 (A) NEGATIVE mg/dL   Hgb urine dipstick SMALL (A) NEGATIVE   Bilirubin Urine NEGATIVE NEGATIVE   Ketones, ur NEGATIVE NEGATIVE mg/dL   Protein, ur NEGATIVE NEGATIVE mg/dL   Nitrite NEGATIVE NEGATIVE   Leukocytes,Ua NEGATIVE NEGATIVE   RBC / HPF 0-5 0 - 5 RBC/hpf   WBC, UA 0-5 0 - 5 WBC/hpf   Bacteria, UA NONE SEEN NONE SEEN   Mucus PRESENT     Comment: Performed at [facility], [address] [place], [place] [address]  HIV Antibody (routine testing w rflx)     Status: None   Collection Time: 03/28/20  9:31 PM  Result Value Ref Range   HIV Screen 4th Generation wRfx Non Reactive Non Reactive    Comment: Performed at [facility], [address] [place], [place] [address]  TSH     Status: None   Collection Time: 03/29/20 12:50 AM  Result Value Ref Range   TSH 0.548 0.350 - 4.500 uIU/mL    Comment: Performed at [facility], [address], [place], [place] [address]  Vitamin B12     Status: None   Collection Time: 03/29/20  1:30 AM  Result Value Ref Range   Vitamin B-12 359 180 - 914 pg/mL    Comment:  (NOTE) This assay is not validated for testing neonatal or myeloproliferative syndrome specimens for Vitamin B12 levels. Performed at [facility], [address] [place], [place] [address]   Comprehensive metabolic panel     Status: Abnormal   Collection Time: 03/29/20  4:58 AM  Result Value Ref Range   Sodium 134 (L) 135 - 145 mmol/L   Potassium 3.3 (L) 3.5 - 5.1 mmol/L   Chloride 111 98 - 111 mmol/L   CO2 17 (L) 22 - 32 mmol/L   Glucose, Bld 83 70 - 99 mg/dL    Comment: Glucose reference range applies only to samples taken after fasting for at least 8 hours.   BUN 6 6 - 20 mg/dL   Creatinine, Ser 9.62 (L) 0.44 - 1.00 mg/dL   Calcium 5.7 (LL) 8.9 - 10.3 mg/dL    Comment: CRITICAL RESULT CALLED TO, READ BACK BY AND VERIFIED WITH: Kathlene November @ 9528 03/29/2020 PERRY, J.    Total Protein 4.4 (L) 6.5 - 8.1 g/dL   Albumin 2.5 (L) 3.5 - 5.0 g/dL   AST 15 15 - 41 U/L   ALT 15 0 - 44 U/L   Alkaline Phosphatase 38 38 - 126 U/L   Total Bilirubin 0.5 0.3 - 1.2 mg/dL   GFR calc non Af Amer >60 >60 mL/min   GFR calc Af Amer >60 >60 mL/min   Anion gap 6 5 - 15    Comment: Performed at [facility], [address], [place], [place] [address]  CBC     Status: Abnormal   Collection Time: 03/29/20  4:58 AM  Result Value Ref Range   WBC 14.3 (H) 4.0 - 10.5 K/uL   RBC 3.15 (L) 3.87 - 5.11 MIL/uL   Hemoglobin 10.0 (L) 12.0 - 15.0 g/dL   HCT 40.1 (L) 36 - 46 %   MCV 100.0 80.0 - 100.0 fL   MCH 31.7 26.0 - 34.0 pg   MCHC 31.7 30.0 - 36.0 g/dL   RDW 02.7 25.3 - 66.4 %   Platelets 222 150 - 400 K/uL   nRBC 0.0 0.0 - 0.2 %    Comment: Performed at [facility] [address] [place], [place] [address]  CBG monitoring, ED     Status: None   Collection Time: 03/29/20 10:01 AM  Result Value Ref Range   Glucose-Capillary 81 70 - 99 mg/dL    Comment: Glucose reference range applies only to samples taken after fasting for at least 8  hours.  Lactic acid, plasma     Status: None   Collection Time: 03/29/20 11:09 AM  Result Value Ref Range   Lactic Acid, Venous 1.1 0.5 - 1.9 mmol/L    Comment: Performed at [facility], [address] [place], [place] [address]  Blood gas, arterial     Status: Abnormal   Collection Time: 03/29/20 11:30 AM  Result Value Ref Range   pH, Arterial 7.387 7.35 - 7.45   pCO2 arterial 43.9 32 - 48 mmHg   pO2, Arterial 59.4 (L) 83 - 108 mmHg   Bicarbonate 25.8 20.0 - 28.0 mmol/L   Acid-Base Excess 1.1 0.0 - 2.0 mmol/L   O2 Saturation  90.8 %   Patient temperature 98.6    Allens test (pass/fail) PASS PASS    Comment: Performed at [facility], [address], [place], [place] [address]  RPR     Status: None   Collection Time: 03/29/20  2:24 PM  Result Value Ref Range   RPR Ser Ql NON REACTIVE NON REACTIVE    Comment: Performed at [facility], [address], [place], [place] [address]  Lactic acid, plasma     Status: None   Collection Time: 03/29/20  2:24 PM  Result Value Ref Range   Lactic Acid, Venous 1.9 0.5 - 1.9 mmol/L    Comment: Performed at [facility], [address] [place], [place] [address]  Glucose, capillary     Status: None   Collection Time: 03/29/20  3:32 PM  Result Value Ref Range   Glucose-Capillary 92 70 - 99 mg/dL    Comment: Glucose reference range applies only to samples taken after fasting for at least 8 hours.   Comment 1 Notify RN    Comment 2 Document in Chart   Glucose, capillary     Status: None   Collection Time: 03/29/20  8:29 PM  Result Value Ref Range   Glucose-Capillary 86 70 - 99 mg/dL    Comment: Glucose reference range applies only to samples taken after fasting for at least 8 hours.  Glucose, capillary     Status: Abnormal   Collection Time: 03/30/20 12:29 AM  Result Value Ref Range   Glucose-Capillary 107 (H) 70 - 99 mg/dL    Comment: Glucose reference range applies only to  samples taken after fasting for at least 8 hours.  Comprehensive metabolic panel     Status: Abnormal   Collection Time: 03/30/20  4:28 AM  Result Value Ref Range   Sodium 134 (L) 135 - 145 mmol/L   Potassium 3.8 3.5 - 5.1 mmol/L   Chloride 100 98 - 111 mmol/L   CO2 24 22 - 32 mmol/L   Glucose, Bld 91 70 - 99 mg/dL    Comment: Glucose reference range applies only to samples taken after fasting for at least 8 hours.   BUN 6 6 - 20 mg/dL   Creatinine, Ser 0.09 0.44 - 1.00 mg/dL   Calcium 8.4 (L) 8.9 - 10.3 mg/dL   Total Protein 6.3 (L) 6.5 - 8.1 g/dL   Albumin 3.5 3.5 - 5.0 g/dL   AST 23 15 - 41 U/L   ALT 20 0 - 44 U/L   Alkaline Phosphatase 61 38 - 126 U/L   Total Bilirubin 0.5 0.3 - 1.2 mg/dL   GFR calc non Af Amer >60 >60 mL/min   GFR calc Af Amer >60 >60 mL/min   Anion gap 10 5 - 15    Comment: Performed at [facility], [address], [place], [place] [address]  CBC     Status: Abnormal   Collection Time: 03/30/20  4:28 AM  Result Value Ref Range   WBC 13.8 (H) 4.0 - 10.5 K/uL   RBC 3.52 (L) 3.87 - 5.11 MIL/uL   Hemoglobin 11.2 (L) 12.0 - 15.0 g/dL   HCT 99.3 (L) 36 - 46 %   MCV 93.8 80.0 - 100.0 fL   MCH 31.8 26.0 - 34.0 pg   MCHC 33.9 30.0 - 36.0 g/dL   RDW 71.6 96.7 - 89.3 %   Platelets 317 150 - 400 K/uL   nRBC 0.0 0.0 -  0.2 %    Comment: Performed at [facility], [address] [place], [place] [address]  Magnesium     Status: None   Collection Time: 03/30/20  4:28 AM  Result Value Ref Range   Magnesium 1.9 1.7 - 2.4 mg/dL    Comment: Performed at [facility], [address] [place], [place] [address]  Glucose, capillary     Status: None   Collection Time: 03/30/20  4:30 AM  Result Value Ref Range   Glucose-Capillary 96 70 - 99 mg/dL    Comment: Glucose reference range applies only to samples taken after fasting for at least 8 hours.  Glucose, capillary     Status: Abnormal   Collection Time:  03/30/20  7:41 AM  Result Value Ref Range   Glucose-Capillary 106 (H) 70 - 99 mg/dL    Comment: Glucose reference range applies only to samples taken after fasting for at least 8 hours.  Glucose, capillary     Status: Abnormal   Collection Time: 03/30/20 11:59 AM  Result Value Ref Range   Glucose-Capillary 106 (H) 70 - 99 mg/dL    Comment: Glucose reference range applies only to samples taken after fasting for at least 8 hours.    Current Facility-Administered Medications  Medication Dose Route Frequency Provider Last Rate Last Admin   enoxaparin (LOVENOX) injection 40 mg  40 mg Subcutaneous QHS Reva Bores, MD   40 mg at 03/30/20 0003   ipratropium-albuterol (DUONEB) 0.5-2.5 (3) MG/3ML nebulizer solution 3 mL  3 mL Nebulization Q4H PRN Amin, Ankit Chirag, MD       loratadine (CLARITIN) tablet 10 mg  10 mg Oral Daily Reva Bores, MD       LORazepam (ATIVAN) injection 2 mg  2 mg Intravenous Q4H PRN Audrea Muscat T, NP   2 mg at 03/29/20 1706   metoprolol tartrate (LOPRESSOR) injection 5 mg  5 mg Intravenous Q6H PRN Reva Bores, MD       ondansetron [facility]) tablet 4 mg  4 mg Oral Q6H PRN Reva Bores, MD       Or   ondansetron [facility]) injection 4 mg  4 mg Intravenous Q6H PRN Reva Bores, MD   4 mg at 03/30/20 1111   polyethylene glycol (MIRALAX / GLYCOLAX) packet 17 g  17 g Oral Daily PRN Amin, Ankit Chirag, MD       senna-docusate (Senokot-S) tablet 2 tablet  2 tablet Oral QHS PRN Amin, Loura Halt, MD        Musculoskeletal: Strength & Muscle Tone: within normal limits Gait & Station: unable to assess as patient resting in bed during interview Patient leans: N/A  Psychiatric Specialty Exam: Physical Exam Vitals and nursing note reviewed.  Constitutional:      General: She is not in acute distress.    Appearance: Normal appearance. She is not ill-appearing.  HENT:     Head: Normocephalic.  Eyes:     Conjunctiva/sclera: Conjunctivae normal.  Pulmonary:      Effort: Pulmonary effort is normal.  Musculoskeletal:        General: Normal range of motion.     Cervical back: Normal range of motion.  Neurological:     General: No focal deficit present.     Mental Status: She is alert and oriented to person, place, and time.  Psychiatric:        Attention and Perception: She does not perceive auditory or visual hallucinations.  Mood and Affect: Mood is anxious and depressed. Affect is blunt.        Speech: Speech is tangential.        Behavior: Behavior is cooperative.        Thought Content: Thought content is not paranoid or delusional. Thought content includes suicidal ideation. Thought content does not include homicidal ideation. Thought content includes suicidal plan. Thought content does not include homicidal plan.        Cognition and Memory: Cognition and memory normal.        Judgment: Judgment is impulsive.     Review of Systems  Constitutional: Negative for appetite change, fatigue and unexpected weight change.  Skin: Negative for color change.  Neurological: Negative for tremors and weakness.  Psychiatric/Behavioral: Positive for dysphoric mood and suicidal ideas. Negative for agitation. The patient is nervous/anxious.     Blood pressure (!) 120/59, pulse 85, temperature 99.2 F (37.3 C), temperature source Oral, resp. rate 14, height 5\' 3"  (1.6 m), weight 71.6 kg, SpO2 93 %.Body mass index is 27.97 kg/m.  General Appearance: Fairly Groomed  Eye Contact:  Good  Speech:  Normal Rate  Volume:  Normal  Mood:  Anxious and Depressed  Affect:  Congruent  Thought Process:  Disorganized  Orientation:  Full (Time, Place, and Person)  Thought Content:  Tangential  Suicidal Thoughts:  Yes.  with intent/plan  Homicidal Thoughts:  No  Memory:  Immediate;   Good Recent;   Good Remote;   Good  Judgement:  Impaired  Insight:  Lacking  Psychomotor Activity:  Restlessness  Concentration:  Concentration: Fair  Recall:  Good  Fund  of Knowledge:  Good  Language:  Good  Akathisia:  Negative  Handed:  Right  AIMS (if indicated):     Assets:  Communication Skills Desire for Improvement Housing Resilience Transportation Vocational/Educational  ADL's:  Intact  Cognition:  WNL  Sleep:   reports adequate sleep     Treatment Plan Summary: Bipolar affective disorder, depressed, severe without psychosis: -TOC consult placed for inpatient psychiatric care once she is medically cleared  Disposition: Recommend psychiatric Inpatient admission when medically cleared.  Waylan Boga, NP 03/30/2020 1:05 PM

## 2020-03-30 NOTE — Progress Notes (Signed)
PROGRESS NOTE    Tanya Simpson  XLK:[id number] DOB: [birth year] DOA: 03/28/2020 PCP: [name], [name], DO   Brief Narrative:  59 year old with history of bipolar/schizophrenia with recent psychiatry medication changes, previous psych hospitalization was brought to the hospital by her husband for change in mental status.  Apparently per the husband she was noted to be unresponsive when he arrived at home and brought to the hospital.  In the hospital she was noted to have prolonged QTC, elevated WBC, hypomagnesemia, hypokalemia.  UDS, CT head was negative.  Admitted to the hospital for further care.  Metabolic work-up negative, psych consulted.   Assessment & Plan:   Principal Problem:   Altered mental status Active Problems:   Bipolar disorder (HCC)   Overdose, undetermined intent, initial encounter  Altered Mental Status with Paranoia, Improved.  Prolong Qtc, resolved -Currently her exam is nonfocal moving all the extremities, she is moaning and groaning.  CT head is negative, UDS test negative, UA-negative.  No obvious signs of infection therefore hold off on antibiotics.  Leukocytosis likely reactive/dehydration. -No need for MRI brain or EEG at the moment -TSH, B12, folate, ammonia -normal -Tylenol, salicylate acid-negative. -Initially QTC was prolonged now resolved.  We will temporarily hold her psychiatry medication until seen by psychiatry.  Psych consulted-pending evaluation  Hypomagnesemia/Hypokalemia/Hypocalcemia Acute metabolic acidosis -Electrolytes are better.  Hypoglycemia -Improved with amp of D50, then D5 half-normal saline  Acute urinary retention Foley catheter in place  Macrocytic anemia TSH B12 normal.  Schizophrenia/Bipolar Disorder.  -Per records home meds-Abilify, Cogentin, Haldol, Lamictal.  Psychiatry consulted before resuming these medications.  Also given her medication is not safe to administer at this time.  I suspect she will require BH H   DVT  prophylaxis: enoxaparin (LOVENOX) injection 40 mg Start: 03/28/20 2300 Code Status:  Family Communication: Husband updated today.   Status is: Inpatient  Remains inpatient appropriate because:Persistent severe electrolyte disturbances   Dispo: The patient is from: Home              Anticipated d/c is to: Will likely require [facility] H              Anticipated d/c date is: 2 days              Patient currentlyIs medically stable to be transitioned to [facility] once evaluated by psychiatry  Body mass index is 27.97 kg/m.   Subjective: Patient awake this morning. Tells me prior to hospital admission she took extra psych meds in order to harm herself.   Review of Systems Otherwise negative except as per HPI, including: General = no fevers, chills, dizziness,  fatigue HEENT/EYES = negative for loss of vision, double vision, blurred vision,  sore throa Cardiovascular= negative for chest pain, palpitation Respiratory/lungs= negative for shortness of breath, cough, wheezing; hemoptysis,  Gastrointestinal= negative for nausea, vomiting, abdominal pain Genitourinary= negative for Dysuria MSK = Negative for arthralgia, myalgias Neurology= Negative for headache, numbness, tingling  Psychiatry= Negative for suicidal and homocidal ideation Skin= Negative for Rash   Examination: Constitutional: Not in acute distress Respiratory: Clear to auscultation bilaterally Cardiovascular: Normal sinus rhythm, no rubs Abdomen: Nontender nondistended good bowel sounds Musculoskeletal: No edema noted Skin: No rashes seen Neurologic: CN 2-12 grossly intact.  And nonfocal Psychiatric: AAOx3. Poor judgement and insight.    Objective: Vitals:   03/29/20 2030 03/30/20 0030 03/30/20 0434 03/30/20 0800  BP: (!) 142/62 (!) 154/85 (!) 153/74 129/69  Pulse: 97 97 93 89  Resp: 16 18 18  18  Temp: 99 F (37.2 C) 98.7 F (37.1 C) 99.1 F (37.3 C) 98.2 F (36.8 C)  TempSrc: Oral Oral Oral Oral  SpO2: 96% 98% 97%  97%  Weight:      Height:        Intake/Output Summary (Last 24 hours) at 03/30/2020 0959 Last data filed at 03/30/2020 3893 Gross per 24 hour  Intake 397.85 ml  Output 2475 ml  Net -2077.15 ml   Filed Weights   03/28/20 2106  Weight: 71.6 kg     Data Reviewed:   CBC: Recent Labs  Lab 03/26/20 1226 03/28/20 1646 03/29/20 0458 03/30/20 0428  WBC 8.4 25.5* 14.3* 13.8*  NEUTROABS  --  23.7*  --   --   HGB 13.5 12.5 10.0* 11.2*  HCT 40.6 37.8 31.5* 33.0*  MCV 91 95.2 100.0 93.8  PLT 399 332 222 317   Basic Metabolic Panel: Recent Labs  Lab 03/26/20 1226 03/28/20 1643 03/28/20 1646 03/29/20 0458 03/30/20 0428  NA 136  --  133* 134* 134*  K 4.8  --  3.3* 3.3* 3.8  CL 99  --  98 111 100  CO2 24  --  25 17* 24  GLUCOSE 87  --  166* 83 91  BUN 10  --  12 6 6   CREATININE 0.79  --  0.64 0.38* 0.63  CALCIUM 9.8  --  8.3* 5.7* 8.4*  MG  --  1.6*  --   --  1.9   GFR: Estimated Creatinine Clearance: 71 mL/min (by C-G formula based on SCr of 0.63 mg/dL). Liver Function Tests: Recent Labs  Lab 03/26/20 1226 03/28/20 1646 03/29/20 0458 03/30/20 0428  AST 22 22 15 23   ALT 24 21 15 20   ALKPHOS 86 58 38 61  BILITOT 0.3 0.4 0.5 0.5  PROT 7.7 7.1 4.4* 6.3*  ALBUMIN 4.7 4.1 2.5* 3.5   No results for input(s): LIPASE, AMYLASE in the last 168 hours. Recent Labs  Lab 03/28/20 1713  AMMONIA 26   Coagulation Profile: No results for input(s): INR, PROTIME in the last 168 hours. Cardiac Enzymes: No results for input(s): CKTOTAL, CKMB, CKMBINDEX, TROPONINI in the last 168 hours. BNP (last 3 results) No results for input(s): PROBNP in the last 8760 hours. HbA1C: No results for input(s): HGBA1C in the last 72 hours. CBG: Recent Labs  Lab 03/29/20 1532 03/29/20 2029 03/30/20 0029 03/30/20 0430 03/30/20 0741  GLUCAP 92 86 107* 96 106*   Lipid Profile: No results for input(s): CHOL, HDL, LDLCALC, TRIG, CHOLHDL, LDLDIRECT in the last 72 hours. Thyroid Function  Tests: Recent Labs    03/29/20 0050  TSH 0.548   Anemia Panel: Recent Labs    03/29/20 0130  VITAMINB12 359   Sepsis Labs: Recent Labs  Lab 03/29/20 1109 03/29/20 1424  LATICACIDVEN 1.1 1.9    Recent Results (from the past 240 hour(s))  SARS Coronavirus 2 by RT PCR (hospital order, performed in [facility] hospital lab)     Status: None   Collection Time: 03/28/20  5:19 PM  Result Value Ref Range Status   SARS Coronavirus 2 NEGATIVE NEGATIVE Final    Comment: (NOTE) SARS-CoV-2 target nucleic acids are NOT DETECTED.  The SARS-CoV-2 RNA is generally detectable in upper and lower respiratory specimens during the acute phase of infection. The lowest concentration of SARS-CoV-2 viral copies this assay can detect is 250 copies / mL. A negative result does not preclude SARS-CoV-2 infection and should not be used as the sole  basis for treatment or other patient management decisions.  A negative result may occur with improper specimen collection / handling, submission of specimen other than nasopharyngeal swab, presence of viral mutation(s) within the areas targeted by this assay, and inadequate number of viral copies (<250 copies / mL). A negative result must be combined with clinical observations, patient history, and epidemiological information.  Fact Sheet for Patients:   StrictlyIdeas.no  Fact Sheet for Healthcare Providers: BankingDealers.co.za  This test is not yet approved or  cleared by the [place] FDA and has been authorized for detection and/or diagnosis of SARS-CoV-2 by FDA under an Emergency Use Authorization (EUA).  This EUA will remain in effect (meaning this test can be used) for the duration of the COVID-19 declaration under Section 564(b)(1) of the Act, 21 U.S.C. section 360bbb-3(b)(1), unless the authorization is terminated or revoked sooner.  Performed at [facility], [place]  [address], [place], [place] [address]          Radiology Studies: CT HEAD WO CONTRAST  Result Date: 03/28/2020 CLINICAL DATA:  Altered level of consciousness, possible overdose EXAM: CT HEAD WITHOUT CONTRAST TECHNIQUE: Contiguous axial images were obtained from the base of the skull through the vertex without intravenous contrast. COMPARISON:  None. FINDINGS: Brain: No acute infarct or hemorrhage. Lateral ventricles and midline structures are unremarkable. No acute extra-axial fluid collections. No mass effect. Vascular: No hyperdense vessel or unexpected calcification. Skull: Normal. Negative for fracture or focal lesion. Sinuses/Orbits: No acute finding. Other: None. IMPRESSION: 1. No acute intracranial process. Electronically Signed   By: Randa Ngo M.D.   On: 03/28/2020 22:09   DG Chest Port 1 View  Result Date: 03/28/2020 CLINICAL DATA:  Altered mental status EXAM: PORTABLE CHEST 1 VIEW COMPARISON:  Portable exam 1736 hours compared to 01/31/2019 FINDINGS: Normal heart size, mediastinal contours, and pulmonary vascularity. Minimal bronchitic changes. Lungs otherwise clear. No pulmonary infiltrate, pleural effusion or pneumothorax. Osseous structures unremarkable. IMPRESSION: Minimal bronchitic changes without infiltrate. Electronically Signed   By: Lavonia Dana M.D.   On: 03/28/2020 18:00        Scheduled Meds: . enoxaparin (LOVENOX) injection  40 mg Subcutaneous QHS  . loratadine  10 mg Oral Daily   Continuous Infusions: . dextrose 5 % and 0.45% NaCl 75 mL/hr at 03/30/20 0633     LOS: 2 days   Time spent= 35 mins    Jaylynn Siefert Arsenio Loader, MD [facility]  If 7PM-7AM, please contact night-coverage  03/30/2020, 9:59 AM

## 2020-03-30 NOTE — Progress Notes (Signed)
   03/30/20 1748  Urine Characteristics  Bladder Scan Volume (mL) 0 mL   Bladder scan completed per MD instruction. Patient is able to void using external catheter and BSC. Urine is yellow and cloudy with scant sediment present.

## 2020-03-30 NOTE — Progress Notes (Signed)
Spouse is present at bedside. Advised spouse that if patient became agitated, he would be asked to leave the room. Also advised spouse that sitter would not be leaving the room at any time. Spouse verbalized understanding.

## 2020-03-30 NOTE — Progress Notes (Signed)
Two hour restraint assessments completed during shift per protocol. Patient remains calm and cooperative. Wrist and ankle restraints have not been used. Anti-anxiety medication has not been administered as patient remains drowsy and lethargic. Seizure precautions remain in place.

## 2020-03-30 NOTE — Progress Notes (Signed)
Patient's restraints have been removed completely from her wrists and ankles. Side rails remain elevated with pads due to seizure precautions. Patient's LOC has increased and she is able to verbalize needs. Patient questions how and why she is here. Provided patient a brief summary of events that led to her hospitalization. Patient states she did intentionally overdose and laughingly states "I am surprised I am still here". Writer asked patient why did she try to commit suicide. Patient replied she did not want to talk about it. Writer asked patient if she would agree to speak with a therapist. Patient agreed. Writer paged Psychiatry and asked Psych NP to come bedside if possible. During encounter, spouse contacted Clinical research associate via phone. Spouse spoke with patient. Patient stated "don't worry about me. I am ok. Don't cry". Writer could hear spouse audibly crying on phone and stating "please don't leave me. I need you." Sitter remains bedside. Zofran 4 mg IV given for complaint of gastric discomfort. Will continue to monitor.

## 2020-03-31 DIAGNOSIS — F319 Bipolar disorder, unspecified: Secondary | ICD-10-CM

## 2020-03-31 DIAGNOSIS — F314 Bipolar disorder, current episode depressed, severe, without psychotic features: Secondary | ICD-10-CM

## 2020-03-31 LAB — COMPREHENSIVE METABOLIC PANEL
ALT: 22 U/L (ref 0–44)
AST: 26 U/L (ref 15–41)
Albumin: 3.3 g/dL — ABNORMAL LOW (ref 3.5–5.0)
Alkaline Phosphatase: 55 U/L (ref 38–126)
Anion gap: 6 (ref 5–15)
BUN: 10 mg/dL (ref 6–20)
CO2: 28 mmol/L (ref 22–32)
Calcium: 8.2 mg/dL — ABNORMAL LOW (ref 8.9–10.3)
Chloride: 101 mmol/L (ref 98–111)
Creatinine, Ser: 0.78 mg/dL (ref 0.44–1.00)
GFR calc Af Amer: >60 mL/min (ref >60)
GFR calc non Af Amer: >60 mL/min (ref >60)
Glucose, Bld: 101 mg/dL — ABNORMAL HIGH (ref 70–99)
Potassium: 3.6 mmol/L (ref 3.5–5.1)
Sodium: 135 mmol/L (ref 135–145)
Total Bilirubin: 0.4 mg/dL (ref 0.3–1.2)
Total Protein: 6.2 g/dL — ABNORMAL LOW (ref 6.5–8.1)

## 2020-03-31 LAB — CBC
HCT: 34.3 % — ABNORMAL LOW (ref 36.0–46.0)
Hemoglobin: 11.5 g/dL — ABNORMAL LOW (ref 12.0–15.0)
MCH: 31.8 pg (ref 26.0–34.0)
MCHC: 33.5 g/dL (ref 30.0–36.0)
MCV: 94.8 fL (ref 80.0–100.0)
Platelets: 296 10*3/uL (ref 150–400)
RBC: 3.62 MIL/uL — ABNORMAL LOW (ref 3.87–5.11)
RDW: 12.3 % (ref 11.5–15.5)
WBC: 10.7 10*3/uL — ABNORMAL HIGH (ref 4.0–10.5)
nRBC: 0 % (ref 0.0–0.2)

## 2020-03-31 LAB — GLUCOSE, CAPILLARY
Glucose-Capillary: 102 mg/dL — ABNORMAL HIGH (ref 70–99)
Glucose-Capillary: 104 mg/dL — ABNORMAL HIGH (ref 70–99)
Glucose-Capillary: 106 mg/dL — ABNORMAL HIGH (ref 70–99)
Glucose-Capillary: 112 mg/dL — ABNORMAL HIGH (ref 70–99)
Glucose-Capillary: 113 mg/dL — ABNORMAL HIGH (ref 70–99)
Glucose-Capillary: 99 mg/dL (ref 70–99)

## 2020-03-31 LAB — CALCIUM, IONIZED: Calcium, Ionized, Serum: 5.3 mg/dL (ref 4.5–5.6)

## 2020-03-31 LAB — MAGNESIUM: Magnesium: 2.1 mg/dL (ref 1.7–2.4)

## 2020-03-31 MED ORDER — ALUM & MAG HYDROXIDE-SIMETH 200-200-20 MG/5ML PO SUSP
30.0000 mL | Freq: Four times a day (QID) | ORAL | Status: DC | PRN
  Administered 2020-03-31: 30 mL via ORAL
  Filled 2020-03-31: qty 30

## 2020-03-31 MED ORDER — POTASSIUM CHLORIDE CRYS ER 20 MEQ PO TBCR
40.0000 meq | EXTENDED_RELEASE_TABLET | Freq: Once | ORAL | Status: AC
  Administered 2020-03-31: 40 meq via ORAL
  Filled 2020-03-31: qty 2

## 2020-03-31 NOTE — Discharge Summary (Addendum)
Physician Discharge Summary  Oletha Tolson PYK:[id number] DOB: 1961-04-08 DOA: 03/28/2020  PCP: [name], [name], DO  Admit date: 03/28/2020 Discharge date: 04/01/20  Admitted From: Home Disposition: Inpatient psychiatry  Recommendations for Outpatient Follow-up:  1. Follow up with PCP in 1-2 weeks 2. Please obtain BMP/CBC in one week your next doctors visit.  3. For now her psychiatry medications have been stopped, this will be resumed once she is inpatient psych   Discharge Condition: Stable CODE STATUS: Full code Diet recommendation: Regular  Brief/Interim Summary: 59 year old with history of bipolar/schizophrenia with recent psychiatry medication changes, previous psych hospitalization was brought to the hospital by her husband for change in mental status.  Apparently per the husband she was noted to be unresponsive when he arrived at home and brought to the hospital.  In the hospital she was noted to have prolonged QTC, elevated WBC, hypomagnesemia, hypokalemia.  UDS, CT head was negative.  Admitted to the hospital for further care.  Metabolic work-up negative, psych consulted.  Patient's electrolytes were stabilized, metabolic encephalopathy work-up was negative.  Her mentation improved after allowing the medications to washout.  Seen by psychiatry recommended inpatient psych.  Arrangements made.   Assessment & Plan:   Principal Problem:   Altered mental status Active Problems:   Bipolar disorder (HCC)   Overdose, undetermined intent, initial encounter  Altered Mental Status with Paranoia,  greatly improved Prolong Qtc, resolved -Currently her exam is nonfocal moving all the extremities, she is moaning and groaning.  CT head is negative, UDS test negative, UA-negative.  No obvious signs of infection therefore hold off on antibiotics.  Leukocytosis likely reactive/dehydration. -No need for MRI brain or EEG at the moment -TSH, B12, folate, ammonia -normal -Tylenol, salicylate  acid-negative. -Initially QTC was prolonged now resolved.  We will temporarily hold her psychiatry medication until seen by psychiatry.  Psych consulted-will transition to inpatient psych Psych meds will be initiated appropriately when she is admitted to [facility]  Hypomagnesemia/Hypokalemia/Hypocalcemia Acute metabolic acidosis -Electrolytes are better.  Hypoglycemia Resolved  Acute urinary retention Resolved Foley catheter removed ce  Macrocytic anemia TSH B12 normal.  Schizophrenia/Bipolar Disorder.  -Per records home meds-Abilify, Cogentin, Haldol, Lamictal.    Currently this medications are on hold, she will be transferred to inpatient psych where they will be further adjusted.  Body mass index is 27.97 kg/m.   Discharge Diagnoses:  Principal Problem:   Altered mental status Active Problems:   Overdose, undetermined intent, initial encounter   Bipolar affective disorder, depressed, severe (HCC)      Consultations: Psychiatry Subjective: Seen and examined this morning, patient does not have any complaints.  She is alert awake oriented.  She feels very guilty of overdosing on her psychiatry medications.  She is requesting help from a psychiatrist so she can go back to routine life.  Discharge Exam: Vitals:   03/31/20 0801 03/31/20 1132  BP: (!) 156/106 (!) 151/92  Pulse: 78 87  Resp: 20 20  Temp: 98.5 F (36.9 C) 98.4 F (36.9 C)  SpO2: 96% 97%   Vitals:   03/30/20 2351 03/31/20 0415 03/31/20 0801 03/31/20 1132  BP: (!) 116/55 (!) 153/67 (!) 156/106 (!) 151/92  Pulse: 81 83 78 87  Resp: 18 18 20 20   Temp: 97.8 F (36.6 C) 97.7 F (36.5 C) 98.5 F (36.9 C) 98.4 F (36.9 C)  TempSrc: Oral Oral Oral   SpO2: 93% 95% 96% 97%  Weight:      Height:  General: Pt is alert, awake, not in acute distress Cardiovascular: RRR, S1/S2 +, no rubs, no gallops Respiratory: CTA bilaterally, no wheezing, no rhonchi Abdominal: Soft, NT, ND, bowel sounds  + Extremities: no edema, no cyanosis  Discharge Instructions   Allergies as of 03/31/2020   No Known Allergies     Medication List    STOP taking these medications   acetaminophen 325 MG tablet Commonly known as: TYLENOL   ARIPiprazole 15 MG tablet Commonly known as: Abilify   benztropine 1 MG tablet Commonly known as: COGENTIN   haloperidol 5 MG tablet Commonly known as: HALDOL   lamoTRIgine 100 MG tablet Commonly known as: LAMICTAL     TAKE these medications   loratadine 10 MG tablet Commonly known as: Claritin Take 1 tablet (10 mg total) by mouth daily.   multivitamin tablet Take 1 tablet by mouth every other day.       No Known Allergies  You were cared for by a hospitalist during your hospital stay. If you have any questions about your discharge medications or the care you received while you were in the hospital after you are discharged, you can call the unit and asked to speak with the hospitalist on call if the hospitalist that took care of you is not available. Once you are discharged, your primary care physician will handle any further medical issues. Please note that no refills for any discharge medications will be authorized once you are discharged, as it is imperative that you return to your primary care physician (or establish a relationship with a primary care physician if you do not have one) for your aftercare needs so that they can reassess your need for medications and monitor your lab values.   Procedures/Studies: CT HEAD WO CONTRAST  Result Date: 03/28/2020 CLINICAL DATA:  Altered level of consciousness, possible overdose EXAM: CT HEAD WITHOUT CONTRAST TECHNIQUE: Contiguous axial images were obtained from the base of the skull through the vertex without intravenous contrast. COMPARISON:  None. FINDINGS: Brain: No acute infarct or hemorrhage. Lateral ventricles and midline structures are unremarkable. No acute extra-axial fluid collections. No mass  effect. Vascular: No hyperdense vessel or unexpected calcification. Skull: Normal. Negative for fracture or focal lesion. Sinuses/Orbits: No acute finding. Other: None. IMPRESSION: 1. No acute intracranial process. Electronically Signed   By: Sharlet Salina M.D.   On: 03/28/2020 22:09   DG Chest Port 1 View  Result Date: 03/28/2020 CLINICAL DATA:  Altered mental status EXAM: PORTABLE CHEST 1 VIEW COMPARISON:  Portable exam 1736 hours compared to 01/31/2019 FINDINGS: Normal heart size, mediastinal contours, and pulmonary vascularity. Minimal bronchitic changes. Lungs otherwise clear. No pulmonary infiltrate, pleural effusion or pneumothorax. Osseous structures unremarkable. IMPRESSION: Minimal bronchitic changes without infiltrate. Electronically Signed   By: Ulyses Southward M.D.   On: 03/28/2020 18:00      The results of significant diagnostics from this hospitalization (including imaging, microbiology, ancillary and laboratory) are listed below for reference.     Microbiology: Recent Results (from the past 240 hour(s))  SARS Coronavirus 2 by RT PCR (hospital order, performed in [facility] hospital lab)     Status: None   Collection Time: 03/28/20  5:19 PM  Result Value Ref Range Status   SARS Coronavirus 2 NEGATIVE NEGATIVE Final    Comment: (NOTE) SARS-CoV-2 target nucleic acids are NOT DETECTED.  The SARS-CoV-2 RNA is generally detectable in upper and lower respiratory specimens during the acute phase of infection. The lowest concentration of SARS-CoV-2 viral copies  this assay can detect is 250 copies / mL. A negative result does not preclude SARS-CoV-2 infection and should not be used as the sole basis for treatment or other patient management decisions.  A negative result may occur with improper specimen collection / handling, submission of specimen other than nasopharyngeal swab, presence of viral mutation(s) within the areas targeted by this assay, and inadequate number of viral  copies (<250 copies / mL). A negative result must be combined with clinical observations, patient history, and epidemiological information.  Fact Sheet for Patients:   BoilerBrush.com.cy  Fact Sheet for Healthcare Providers: https://pope.com/  This test is not yet approved or  cleared by the [place] FDA and has been authorized for detection and/or diagnosis of SARS-CoV-2 by FDA under an Emergency Use Authorization (EUA).  This EUA will remain in effect (meaning this test can be used) for the duration of the COVID-19 declaration under Section 564(b)(1) of the Act, 21 U.S.C. section 360bbb-3(b)(1), unless the authorization is terminated or revoked sooner.  Performed at [facility], [address] [place], [place] [address]      Labs: BNP (last 3 results) No results for input(s): BNP in the last 8760 hours. Basic Metabolic Panel: Recent Labs  Lab 03/26/20 1226 03/28/20 1643 03/28/20 1646 03/29/20 0458 03/30/20 0428 03/31/20 0428  NA 136  --  133* 134* 134* 135  K 4.8  --  3.3* 3.3* 3.8 3.6  CL 99  --  98 111 100 101  CO2 24  --  25 17* 24 28  GLUCOSE 87  --  166* 83 91 101*  BUN 10  --  12 6 6 10   CREATININE 0.79  --  0.64 0.38* 0.63 0.78  CALCIUM 9.8  --  8.3* 5.7* 8.4* 8.2*  MG  --  1.6*  --   --  1.9 2.1   Liver Function Tests: Recent Labs  Lab 03/26/20 1226 03/28/20 1646 03/29/20 0458 03/30/20 0428 03/31/20 0428  AST 22 22 15 23 26   ALT 24 21 15 20 22   ALKPHOS 86 58 38 61 55  BILITOT 0.3 0.4 0.5 0.5 0.4  PROT 7.7 7.1 4.4* 6.3* 6.2*  ALBUMIN 4.7 4.1 2.5* 3.5 3.3*   No results for input(s): LIPASE, AMYLASE in the last 168 hours. Recent Labs  Lab 03/28/20 1713  AMMONIA 26   CBC: Recent Labs  Lab 03/26/20 1226 03/28/20 1646 03/29/20 0458 03/30/20 0428 03/31/20 0428  WBC 8.4 25.5* 14.3* 13.8* 10.7*  NEUTROABS  --  23.7*  --   --   --   HGB 13.5 12.5 10.0* 11.2* 11.5*  HCT  40.6 37.8 31.5* 33.0* 34.3*  MCV 91 95.2 100.0 93.8 94.8  PLT 399 332 222 317 296   Cardiac Enzymes: No results for input(s): CKTOTAL, CKMB, CKMBINDEX, TROPONINI in the last 168 hours. BNP: Invalid input(s): POCBNP CBG: Recent Labs  Lab 03/30/20 2009 03/30/20 2354 03/31/20 0420 03/31/20 0731 03/31/20 1127  GLUCAP 123* 107* 99 106* 102*   D-Dimer No results for input(s): DDIMER in the last 72 hours. Hgb A1c No results for input(s): HGBA1C in the last 72 hours. Lipid Profile No results for input(s): CHOL, HDL, LDLCALC, TRIG, CHOLHDL, LDLDIRECT in the last 72 hours. Thyroid function studies Recent Labs    03/29/20 0050  TSH 0.548   Anemia work up Recent Labs    03/29/20 0130  VITAMINB12 359   Urinalysis    Component Value Date/Time   COLORURINE STRAW (A) 03/28/2020  [place] 03/28/2020 1758   LABSPEC 1.006 03/28/2020 [place] 7.0 03/28/2020 1758   GLUCOSEU >=500 (A) 03/28/2020 1758   HGBUR SMALL (A) 03/28/2020 1758   BILIRUBINUR NEGATIVE 03/28/2020 1758   KETONESUR NEGATIVE 03/28/2020 1758   PROTEINUR NEGATIVE 03/28/2020 1758   NITRITE NEGATIVE 03/28/2020 1758   LEUKOCYTESUR NEGATIVE 03/28/2020 1758   Sepsis Labs Invalid input(s): PROCALCITONIN,  WBC,  LACTICIDVEN Microbiology Recent Results (from the past 240 hour(s))  SARS Coronavirus 2 by RT PCR (hospital order, performed in [place] hospital lab)     Status: None   Collection Time: 03/28/20  5:19 PM  Result Value Ref Range Status   SARS Coronavirus 2 NEGATIVE NEGATIVE Final    Comment: (NOTE) SARS-CoV-2 target nucleic acids are NOT DETECTED.  The SARS-CoV-2 RNA is generally detectable in upper and lower respiratory specimens during the acute phase of infection. The lowest concentration of SARS-CoV-2 viral copies this assay can detect is 250 copies / mL. A negative result does not preclude SARS-CoV-2 infection and should not be used as the sole basis for treatment or  other patient management decisions.  A negative result may occur with improper specimen collection / handling, submission of specimen other than nasopharyngeal swab, presence of viral mutation(s) within the areas targeted by this assay, and inadequate number of viral copies (<250 copies / mL). A negative result must be combined with clinical observations, patient history, and epidemiological information.  Fact Sheet for Patients:   StrictlyIdeas.no  Fact Sheet for Healthcare Providers: BankingDealers.co.za  This test is not yet approved or  cleared by the [place] FDA and has been authorized for detection and/or diagnosis of SARS-CoV-2 by FDA under an Emergency Use Authorization (EUA).  This EUA will remain in effect (meaning this test can be used) for the duration of the COVID-19 declaration under Section 564(b)(1) of the Act, 21 U.S.C. section 360bbb-3(b)(1), unless the authorization is terminated or revoked sooner.  Performed at [facility], [place] [address] [place], [place] [address]      Time coordinating discharge:  I have spent 35 minutes face to face with the patient and on the ward discussing the patients care, assessment, plan and disposition with other care givers. >50% of the time was devoted counseling the patient about the risks and benefits of treatment/Discharge disposition and coordinating care.   SIGNED:   Damita Lack, MD  [facility] 03/31/2020, 11:55 AM   If 7PM-7AM, please contact night-coverage

## 2020-03-31 NOTE — TOC Progression Note (Addendum)
Transition of Care [facility]) - Progression Note    Patient Details  Name: Tranisha Tissue MRN: [id number] Date of Birth: [birth year]  [facility]) CM/SW Contact  Darleene Cleaver, [place] Phone Number: 03/31/2020, 4:38 PM  Clinical Narrative:     Psych is recommending inpatient psych placement.  CSW contacted [facility] and they do not have any beds available.  CSW tried to contact [facility] behavioral health and there was not answer.      CSW continuing to look for placement for patient, she does not currently have a bed available for her.        Expected Discharge Plan and Services           Expected Discharge Date: 03/31/20                                     Social Determinants of Health (SDOH) Interventions    Readmission Risk Interventions No flowsheet data found.

## 2020-03-31 NOTE — Care Management Important Message (Signed)
Important Message  Patient Details IM Letter given to Windell Moulding SW Case Manager to present to the Patient Name: Tanya Simpson MRN: [id number] Date of Birth: 1961/06/24   Medicare Important Message Given:  Yes     Caren Macadam 03/31/2020, 11:07 AM

## 2020-04-01 ENCOUNTER — Other Ambulatory Visit: Payer: Self-pay

## 2020-04-01 ENCOUNTER — Inpatient Hospital Stay
Admission: EM | Admit: 2020-04-01 | Discharge: 2020-04-21 | DRG: 885 | Disposition: A | Discharge status: Home / Self Care | Payer: Medicare Other | Source: Intra-hospital | Attending: Internal Medicine | Admitting: Internal Medicine

## 2020-04-01 DIAGNOSIS — F312 Bipolar disorder, current episode manic severe with psychotic features: Principal | ICD-10-CM | POA: Diagnosis present

## 2020-04-01 DIAGNOSIS — T426X2A Poisoning by other antiepileptic and sedative-hypnotic drugs, intentional self-harm, initial encounter: Secondary | ICD-10-CM | POA: Diagnosis present

## 2020-04-01 DIAGNOSIS — Z818 Family history of other mental and behavioral disorders: Secondary | ICD-10-CM

## 2020-04-01 DIAGNOSIS — F23 Brief psychotic disorder: Secondary | ICD-10-CM | POA: Diagnosis present

## 2020-04-01 DIAGNOSIS — Z87891 Personal history of nicotine dependence: Secondary | ICD-10-CM

## 2020-04-01 LAB — GLUCOSE, CAPILLARY
Glucose-Capillary: 102 mg/dL — ABNORMAL HIGH (ref 70–99)
Glucose-Capillary: 107 mg/dL — ABNORMAL HIGH (ref 70–99)
Glucose-Capillary: 89 mg/dL (ref 70–99)

## 2020-04-01 LAB — COMPREHENSIVE METABOLIC PANEL
ALT: 34 U/L (ref 0–44)
AST: 34 U/L (ref 15–41)
Albumin: 3.7 g/dL (ref 3.5–5.0)
Alkaline Phosphatase: 65 U/L (ref 38–126)
Anion gap: 7 (ref 5–15)
BUN: 7 mg/dL (ref 6–20)
CO2: 27 mmol/L (ref 22–32)
Calcium: 8.9 mg/dL (ref 8.9–10.3)
Chloride: 101 mmol/L (ref 98–111)
Creatinine, Ser: 0.65 mg/dL (ref 0.44–1.00)
GFR calc Af Amer: >60 mL/min (ref >60)
GFR calc non Af Amer: >60 mL/min (ref >60)
Glucose, Bld: 105 mg/dL — ABNORMAL HIGH (ref 70–99)
Potassium: 3.9 mmol/L (ref 3.5–5.1)
Sodium: 135 mmol/L (ref 135–145)
Total Bilirubin: 0.5 mg/dL (ref 0.3–1.2)
Total Protein: 6.9 g/dL (ref 6.5–8.1)

## 2020-04-01 LAB — CBC
HCT: 37.4 % (ref 36.0–46.0)
Hemoglobin: 12.5 g/dL (ref 12.0–15.0)
MCH: 31.2 pg (ref 26.0–34.0)
MCHC: 33.4 g/dL (ref 30.0–36.0)
MCV: 93.3 fL (ref 80.0–100.0)
Platelets: 331 10*3/uL (ref 150–400)
RBC: 4.01 MIL/uL (ref 3.87–5.11)
RDW: 12.2 % (ref 11.5–15.5)
WBC: 10.7 10*3/uL — ABNORMAL HIGH (ref 4.0–10.5)
nRBC: 0 % (ref 0.0–0.2)

## 2020-04-01 LAB — MAGNESIUM: Magnesium: 2.2 mg/dL (ref 1.7–2.4)

## 2020-04-01 NOTE — Progress Notes (Signed)
Patient admitted from [facility] with an overdose of home medications.Skin assessment and body search done.Made patient comfortable in the unit.Food and fluids offered.Repor given to night shift.

## 2020-04-01 NOTE — Progress Notes (Signed)
RN phoned report to [place] Pt has already been picked up by [facility] for transport to [employer] [facility]. No further questions at this time.

## 2020-04-01 NOTE — BH Assessment (Signed)
Pt admission is scheduled between anytime after 4pm   Patient has been accepted to [facility].  Accepting physician is Dr. Toni Amend.  Attending Physician will be Dr.Clapacs.  Patient has been assigned to room 305, by [facility] Charge Nurse Gigi.   Call report to [phone].  Representative/Transfer Coordinator is Malia Corsi Patient pre-admitted by [facility] Patient Access [facility])  [facility] ER Staff Minerva Areola, SW) made aware of acceptance.

## 2020-04-01 NOTE — TOC Transition Note (Addendum)
Transition of Care [facility]) - CM/SW Discharge Note   Patient Details  Name: [name] MRN: [id number] Date of Birth: 1961/08/18  Transition of Care [facility]) CM/SW Contact:  Darleene Cleaver, LCSW Phone Number: 04/01/2020, 1:15 PM   Clinical Narrative:     Patient to be d/c'ed today to [facility] room 305.  Patient under IVC and will be transported via [facility] Department.  Nurse to call report to [facility] Charge Nurse [place], [phone].   CSW received message from [facility], that they have an emergency situation, and can not accept that patient after 4pm anymore.  Awaiting confirmation of what time patient can be discharged.  CSW was informed by [facility] department that they spoke with [facility] and will transport patient after 5:30pm, and will contact nurse's station once the officer is en route to [employer] from [facility].  CSW notified bedside nurse and made her aware of change in transportation time.  Final next level of care: Psychiatric Hospital Barriers to Discharge: Barriers Resolved   Patient Goals and CMS Choice Patient states their goals for this hospitalization and ongoing recovery are:: To go to behavioral health and then return back home.   Choice offered to / list presented to : NA  Discharge Placement                Patient to be transferred to facility by: [facility] Department Name of family member notified: Patient's husband [name] [phone] Patient and family notified of of transfer: 04/01/20  Discharge Plan and Services In-house Referral: Clinical Social Work                DME Agency: NA                  Social Determinants of Health (SDOH) Interventions     Readmission Risk Interventions No flowsheet data found.

## 2020-04-01 NOTE — TOC Progression Note (Addendum)
Transition of Care [facility]) - Progression Note    Patient Details  Name: Tanya Simpson MRN: [id number] Date of Birth: October 02, 1961  Transition of Care [facility]) CM/SW Contact  Darleene Cleaver, [place] Phone Number: 04/01/2020, 11:19 AM  Clinical Narrative:     CSW spoke to [facility] and [facility] neither one of them have beds available for patient.  CSW continuing to look for placement at other behavioral health facilities.  11:55am CSW received phone call from [facility], and they have a bed available and can accept patient today after 4pm.  CSW to make arrangements for transportation to [facility].  12:19pm  CSW spoke to Liliane Shi at [facility] department they will contact nursing station once they have arrived.  Per Liliane Shi, it will probably be around 3pm.  CSW updated bedside nurse and attending physician.   Expected Discharge Plan: Psychiatric Hospital Barriers to Discharge: Psych Bed not available  Expected Discharge Plan and Services Expected Discharge Plan: Psychiatric Hospital In-house Referral: Clinical Social Work     Living arrangements for the past 2 months: Single Family Home Expected Discharge Date: 04/01/20                                     Social Determinants of Health (SDOH) Interventions    Readmission Risk Interventions No flowsheet data found.

## 2020-04-01 NOTE — Telephone Encounter (Signed)
Tanya Simpson called today to apologize for not calling you back.  She is in the hospital. Has an appt scheduled in a couple of weeks, if she is out of the hospital she will see you then.  If not she will RS

## 2020-04-01 NOTE — Progress Notes (Signed)
Seen and examined this morning, patient is resting comfortably she does not have any complaints.  Currently awaiting bed at [facility].  Case discussed with RN.  Call with any further questions as needed.  Discharge summary completed 03/31/2020, updated today.  Stephania Fragmin MD [facility]

## 2020-04-01 NOTE — TOC Initial Note (Signed)
Transition of Care [facility]) - Initial/Assessment Note    Patient Details  Name: Tanya Simpson MRN: [id number] Date of Birth: January 31, 1961  Transition of Care [facility]) CM/SW Contact:    Darleene Cleaver, LCSW Phone Number: 04/01/2020, 10:03 AM  Clinical Narrative:                 Patient was placed under IVC due to attempted suicide.  Patient has a Comptroller and psych saw her recommending inpatient psych placement.  Per patient's husband she has attempted suicide in the past, and has been placed at inpatient psych previously.  Per psych, patient needs inpatient placement.  CSW is looking for placement for patient.  Expected Discharge Plan: Psychiatric Hospital Barriers to Discharge: Psych Bed not available   Patient Goals and CMS Choice Patient states their goals for this hospitalization and ongoing recovery are:: To return back home once stabilized.   Choice offered to / list presented to : NA  Expected Discharge Plan and Services Expected Discharge Plan: Psychiatric Hospital In-house Referral: Clinical Social Work     Living arrangements for the past 2 months: Single Family Home Expected Discharge Date: 04/01/20                                    Prior Living Arrangements/Services Living arrangements for the past 2 months: Single Family Home Lives with:: Spouse Patient language and need for interpreter reviewed:: Yes Do you feel safe going back to the place where you live?: No   Patient needs to stabilized by psych before able to return back home.  Need for Family Participation in Patient Care: No (Comment) Care giver support system in place?: No (comment)   Criminal Activity/Legal Involvement Pertinent to Current Situation/Hospitalization: No - Comment as needed  Activities of Daily Living Home Assistive Devices/Equipment: Other (Comment) (pt unable to answer questions) ADL Screening (condition at time of admission) Patient's cognitive ability adequate to safely complete  daily activities?: No Is the patient deaf or have difficulty hearing?: No Does the patient have difficulty seeing, even when wearing glasses/contacts?: No Does the patient have difficulty concentrating, remembering, or making decisions?: Yes Patient able to express need for assistance with ADLs?: No Does the patient have difficulty dressing or bathing?: Yes Independently performs ADLs?: No Communication: Needs assistance Is this a change from baseline?: Change from baseline, expected to last <3 days Dressing (OT): Needs assistance Is this a change from baseline?: Change from baseline, expected to last <3days Grooming: Needs assistance Is this a change from baseline?: Change from baseline, expected to last <3 days Feeding: Needs assistance Is this a change from baseline?: Change from baseline, expected to last <3 days Bathing: Needs assistance Is this a change from baseline?: Change from baseline, expected to last <3 days Toileting: Needs assistance Is this a change from baseline?: Change from baseline, expected to last <3 days In/Out Bed: Needs assistance Is this a change from baseline?: Change from baseline, expected to last <3 days Walks in Home: Needs assistance Is this a change from baseline?: Change from baseline, expected to last <3 days Does the patient have difficulty walking or climbing stairs?: Yes Weakness of Legs: Both Weakness of Arms/Hands: Both  Permission Sought/Granted Permission sought to share information with : Facility Medical sales representative, Family Supports Permission granted to share information with : Yes, Release of Information Signed  Share Information with NAME: Tanya Simpson, Tanya Simpson   [phone]  Permission granted  to share info w AGENCY: Behavioral Health        Emotional Assessment Appearance:: Appears stated age Attitude/Demeanor/Rapport: Aggressive (Verbally and/or physically), Angry Affect (typically observed): Appropriate, Stable Orientation: :  Oriented to Self, Oriented to Place, Oriented to  Time, Oriented to Situation Alcohol / Substance Use: Tobacco Use Psych Involvement: Yes (comment)  Admission diagnosis:  Hypokalemia [E87.6] Hypomagnesemia [E83.42] Altered mental status [R41.82] Medication overdose, intentional self-harm, initial encounter [facility]) [T50.902A] Patient Active Problem List   Diagnosis Date Noted   Bipolar affective disorder, depressed, severe (HCC) 03/30/2020   Altered mental status 03/28/2020   Overdose, undetermined intent, initial encounter 03/28/2020   PCP:  [name], [name], DO Pharmacy:   [facility] [address], [place] - [address] [place] [address] Phone: [phone] Fax: [phone]     Social Determinants of Health (SDOH) Interventions    Readmission Risk Interventions No flowsheet data found.

## 2020-04-02 ENCOUNTER — Encounter: Payer: Self-pay | Admitting: Psychiatry

## 2020-04-02 DIAGNOSIS — F312 Bipolar disorder, current episode manic severe with psychotic features: Secondary | ICD-10-CM | POA: Diagnosis present

## 2020-04-02 MED ORDER — ALUM & MAG HYDROXIDE-SIMETH 200-200-20 MG/5ML PO SUSP: 30.0000 mL | ORAL | Status: DC | PRN

## 2020-04-02 MED ORDER — HALOPERIDOL 5 MG PO TABS
5.0000 mg | ORAL_TABLET | Freq: Every day | ORAL | Status: DC
  Administered 2020-04-04: 5 mg via ORAL
  Filled 2020-04-02: qty 1

## 2020-04-02 MED ORDER — HALOPERIDOL 5 MG PO TABS
10.0000 mg | ORAL_TABLET | Freq: Every day | ORAL | Status: DC
  Administered 2020-04-02 – 2020-04-03 (×2): 10 mg via ORAL
  Filled 2020-04-02 (×2): qty 2

## 2020-04-02 MED ORDER — MAGNESIUM HYDROXIDE 400 MG/5ML PO SUSP: 30.0000 mL | Freq: Every day | ORAL | Status: DC | PRN

## 2020-04-02 MED ORDER — LAMOTRIGINE 100 MG PO TABS
100.0000 mg | ORAL_TABLET | Freq: Every day | ORAL | Status: DC
  Administered 2020-04-02 – 2020-04-07 (×6): 100 mg via ORAL
  Filled 2020-04-02 (×6): qty 1

## 2020-04-02 MED ORDER — ACETAMINOPHEN 325 MG PO TABS
650.0000 mg | ORAL_TABLET | Freq: Four times a day (QID) | ORAL | Status: DC | PRN
  Administered 2020-04-05 – 2020-04-20 (×4): 650 mg via ORAL
  Filled 2020-04-02 (×4): qty 2

## 2020-04-02 MED ORDER — BENZTROPINE MESYLATE 1 MG PO TABS
1.0000 mg | ORAL_TABLET | Freq: Two times a day (BID) | ORAL | Status: DC
  Administered 2020-04-03 – 2020-04-21 (×34): 1 mg via ORAL
  Filled 2020-04-02 (×36): qty 1

## 2020-04-02 NOTE — H&P (Signed)
Psychiatric Admission Assessment Adult  Patient Identification: Tanya Simpson MRN:  [id number] Date of Evaluation:  04/02/2020 Chief Complaint:  Acute psychosis (HCC) [F23] Bipolar affective disorder, current episode manic with psychotic symptoms (HCC) [F31.2] Principal Diagnosis: Bipolar affective disorder, current episode manic with psychotic symptoms (HCC) Diagnosis:  Principal Problem:   Bipolar affective disorder, current episode manic with psychotic symptoms (HCC) Active Problems:   Acute psychosis (HCC)  History of Present Illness: Patient seen chart reviewed.  With her consent I also spoke with her husband.  This is a 59 year old woman with longstanding chronic mental health problems transferred to us from [place].  She presented there after being found at home by her husband passed out having taken an intentional overdose of her medication.  Patient admits this was a suicide attempt.  She has been extremely paranoid recently.  She is open about her belief that people are constantly spying on her through her phone through her computer through cameras in her house etc.  Her paranoia involves her husband and her son.  Mood has been anxious and depressed.  Sleep has been erratic.  Patient had recently been through a medication change.  Her outpatient mental health provider had been switching her to Abilify and cutting down on her Haldol because of concern about tardive dyskinesia or side effects of Haldol.  Patient denies alcohol or drug abuse. Associated Signs/Symptoms: Depression Symptoms:  depressed mood, psychomotor retardation, feelings of worthlessness/guilt, difficulty concentrating, suicidal attempt, anxiety, (Hypo) Manic Symptoms:  Delusions, Impulsivity, Anxiety Symptoms:  Excessive Worry, Psychotic Symptoms:  Delusions, Paranoia, PTSD Symptoms: Negative Total Time spent with patient: 1 hour  Past Psychiatric History: Longstanding history of chronic mental health  problems.  Multiple hospitalizations in the past.  Husband reports at least 2 prior serious suicide attempts by the patient when she was psychotic.  Seems to frequently get much worse when coming off of her medicine.  No history of violence to others is reported.  Diagnoses either schizoaffective or bipolar disorder.  Patient evidently had been stable on 15 mg a day of haloperidol but was expressing complaints about side effects.  She remembers other medicines in the past including Risperdal and Zyprexa but is vague about which ones have been most effective.  Recent stresses include having lost her job at [employer].  It sounds like that could have actually been a result of paranoia but also made her paranoia worse  Is the patient at risk to self? Yes.    Has the patient been a risk to self in the past 6 months? Yes.    Has the patient been a risk to self within the distant past? Yes.    Is the patient a risk to others? No.  Has the patient been a risk to others in the past 6 months? No.  Has the patient been a risk to others within the distant past? No.   Prior Inpatient Therapy:   Prior Outpatient Therapy:    Alcohol Screening: 1. How often do you have a drink containing alcohol?: Never 2. How many drinks containing alcohol do you have on a typical day when you are drinking?: 1 or 2 3. How often do you have six or more drinks on one occasion?: Never AUDIT-C Score: 0 4. How often during the last year have you found that you were not able to stop drinking once you had started?: Never 5. How often during the last year have you failed to do what was normally expected from you  because of drinking?: Never 6. How often during the last year have you needed a first drink in the morning to get yourself going after a heavy drinking session?: Never 7. How often during the last year have you had a feeling of guilt of remorse after drinking?: Never 8. How often during the last year have you been unable to  remember what happened the night before because you had been drinking?: Never 9. Have you or someone else been injured as a result of your drinking?: No 10. Has a relative or friend or a doctor or another health worker been concerned about your drinking or suggested you cut down?: No Alcohol Use Disorder Identification Test Final Score (AUDIT): 0 Alcohol Brief Interventions/Follow-up: AUDIT Score <7 follow-up not indicated Substance Abuse History in the last 12 months:  No. Consequences of Substance Abuse: Negative Previous Psychotropic Medications: Yes  Psychological Evaluations: Yes  Past Medical History:  Past Medical History:  Diagnosis Date  . Allergic rhinitis   . Anxiety   . Depression   . Schizophrenia [facility])     Past Surgical History:  Procedure Laterality Date  . TONSILLECTOMY     Family History:  Family History  Problem Relation Age of Onset  . Hypertension Mother   . Impulse control disorder Mother   . CVA Father   . Depression Father    Family Psychiatric  History: Patient says that her parents both had mood problems but it is unclear exactly in which way.  She says that her father was paranoid Tobacco Screening:   Social History:  Social History   Substance and Sexual Activity  Alcohol Use Yes  . Alcohol/week: 1.0 standard drink  . Types: 1 Glasses of wine per week   Comment: very little     Social History   Substance and Sexual Activity  Drug Use No    Additional Social History: Marital status: Married Number of Years Married: 16 What types of issues is patient dealing with in the relationship?: Pt states "my husband plays mind games and then plays innocent" Does patient have children?: Yes How many children?: 2 How is patient's relationship with their children?: Pt reports having good relationship with her son and no interaction with her daughter                         Allergies:  No Known Allergies Lab Results:  Results for orders  placed or performed during the hospital encounter of 03/28/20 (from the past 48 hour(s))  Glucose, capillary     Status: Abnormal   Collection Time: 03/31/20  4:36 PM  Result Value Ref Range   Glucose-Capillary 104 (H) 70 - 99 mg/dL    Comment: Glucose reference range applies only to samples taken after fasting for at least 8 hours.  Glucose, capillary     Status: Abnormal   Collection Time: 03/31/20  7:59 PM  Result Value Ref Range   Glucose-Capillary 112 (H) 70 - 99 mg/dL    Comment: Glucose reference range applies only to samples taken after fasting for at least 8 hours.  Glucose, capillary     Status: Abnormal   Collection Time: 03/31/20 11:54 PM  Result Value Ref Range   Glucose-Capillary 113 (H) 70 - 99 mg/dL    Comment: Glucose reference range applies only to samples taken after fasting for at least 8 hours.  Comprehensive metabolic panel     Status: Abnormal   Collection Time: 04/01/20  3:48 AM  Result Value Ref Range   Sodium 135 135 - 145 mmol/L   Potassium 3.9 3.5 - 5.1 mmol/L   Chloride 101 98 - 111 mmol/L   CO2 27 22 - 32 mmol/L   Glucose, Bld 105 (H) 70 - 99 mg/dL    Comment: Glucose reference range applies only to samples taken after fasting for at least 8 hours.   BUN 7 6 - 20 mg/dL   Creatinine, Ser 9.51 0.44 - 1.00 mg/dL   Calcium 8.9 8.9 - 88.4 mg/dL   Total Protein 6.9 6.5 - 8.1 g/dL   Albumin 3.7 3.5 - 5.0 g/dL   AST 34 15 - 41 U/L   ALT 34 0 - 44 U/L   Alkaline Phosphatase 65 38 - 126 U/L   Total Bilirubin 0.5 0.3 - 1.2 mg/dL   GFR calc non Af Amer >60 >60 mL/min   GFR calc Af Amer >60 >60 mL/min   Anion gap 7 5 - 15    Comment: Performed at [facility], [address], [place], [place] [address]  CBC     Status: Abnormal   Collection Time: 04/01/20  3:48 AM  Result Value Ref Range   WBC 10.7 (H) 4.0 - 10.5 K/uL   RBC 4.01 3.87 - 5.11 MIL/uL   Hemoglobin 12.5 12.0 - 15.0 g/dL   HCT 30.1 36 - 46 %   MCV 93.3 80.0 - 100.0 fL   MCH  31.2 26.0 - 34.0 pg   MCHC 33.4 30.0 - 36.0 g/dL   RDW 60.1 09.3 - 23.5 %   Platelets 331 150 - 400 K/uL   nRBC 0.0 0.0 - 0.2 %    Comment: Performed at [facility], [address] [place], [place] [address]  Magnesium     Status: None   Collection Time: 04/01/20  3:48 AM  Result Value Ref Range   Magnesium 2.2 1.7 - 2.4 mg/dL    Comment: Performed at [facility], [address], [place], [place] [address]  Glucose, capillary     Status: Abnormal   Collection Time: 04/01/20  3:57 AM  Result Value Ref Range   Glucose-Capillary 102 (H) 70 - 99 mg/dL    Comment: Glucose reference range applies only to samples taken after fasting for at least 8 hours.  Glucose, capillary     Status: Abnormal   Collection Time: 04/01/20  7:48 AM  Result Value Ref Range   Glucose-Capillary 107 (H) 70 - 99 mg/dL    Comment: Glucose reference range applies only to samples taken after fasting for at least 8 hours.  Glucose, capillary     Status: None   Collection Time: 04/01/20 11:56 AM  Result Value Ref Range   Glucose-Capillary 89 70 - 99 mg/dL    Comment: Glucose reference range applies only to samples taken after fasting for at least 8 hours.    Blood Alcohol level:  Lab Results  Component Value Date   ETH <10 03/28/2020    Metabolic Disorder Labs:  No results found for: HGBA1C, MPG No results found for: PROLACTIN Lab Results  Component Value Date   CHOL 185 03/26/2020   TRIG 92 03/26/2020   HDL 61 03/26/2020   CHOLHDL 3.0 03/26/2020   LDLCALC 107 (H) 03/26/2020   LDLCALC 100 (H) 10/11/2017    Current Medications: Current Facility-Administered Medications  Medication Dose Route Frequency Provider Last Rate Last Admin  . acetaminophen (TYLENOL) tablet 650  mg  650 mg Oral Q6H PRN Bobbi Kozakiewicz T, MD      . alum & mag hydroxide-simeth (MAALOX/MYLANTA) 200-200-20 MG/5ML suspension 30 mL  30 mL Oral Q4H PRN Amiliana Foutz T, MD      . benztropine  (COGENTIN) tablet 1 mg  1 mg Oral BID Cortlin Marano T, MD      . haloperidol (HALDOL) tablet 10 mg  10 mg Oral QHS Pascal Stiggers T, MD      . Melene Muller ON 04/03/2020] haloperidol (HALDOL) tablet 5 mg  5 mg Oral QAC breakfast Ory Elting T, MD      . lamoTRIgine (LAMICTAL) tablet 100 mg  100 mg Oral QHS Mann Skaggs T, MD      . magnesium hydroxide (MILK OF MAGNESIA) suspension 30 mL  30 mL Oral Daily PRN Julya Alioto, Jackquline Denmark, MD       PTA Medications: Medications Prior to Admission  Medication Sig Dispense Refill Last Dose  . loratadine (CLARITIN) 10 MG tablet Take 1 tablet (10 mg total) by mouth daily. 90 tablet 2   . Multiple Vitamin (MULTIVITAMIN) tablet Take 1 tablet by mouth every other day.       Musculoskeletal: Strength & Muscle Tone: within normal limits Gait & Station: normal Patient leans: N/A  Psychiatric Specialty Exam: Physical Exam Vitals and nursing note reviewed.  Constitutional:      Appearance: She is well-developed.  HENT:     Head: Normocephalic and atraumatic.  Eyes:     Conjunctiva/sclera: Conjunctivae normal.     Pupils: Pupils are equal, round, and reactive to light.  Cardiovascular:     Heart sounds: Normal heart sounds.  Pulmonary:     Effort: Pulmonary effort is normal.  Abdominal:     Palpations: Abdomen is soft.  Musculoskeletal:        General: Normal range of motion.     Cervical back: Normal range of motion.  Skin:    General: Skin is warm and dry.  Neurological:     General: No focal deficit present.     Mental Status: She is alert.  Psychiatric:        Attention and Perception: She is inattentive.        Mood and Affect: Mood is anxious. Affect is labile.        Speech: Speech is rapid and pressured.        Behavior: Behavior is agitated. Behavior is not aggressive.        Thought Content: Thought content is paranoid and delusional.        Cognition and Memory: Memory is impaired.        Judgment: Judgment is inappropriate.     Review of  Systems  Constitutional: Negative.   HENT: Negative.   Eyes: Negative.   Respiratory: Negative.   Cardiovascular: Negative.   Gastrointestinal: Negative.   Musculoskeletal: Negative.   Skin: Negative.   Neurological: Negative.   Psychiatric/Behavioral: Positive for behavioral problems, dysphoric mood and suicidal ideas. The patient is nervous/anxious.     Blood pressure 133/76, pulse 92, temperature 98.5 F (36.9 C), temperature source Oral, resp. rate 18, height  (1.6 m), weight 66.7 kg, SpO2 100 %.Body mass index is 26.04 kg/m.  General Appearance: Casual  Eye Contact:  Good  Speech:  Normal Rate  Volume:  Increased  Mood:  Anxious and Dysphoric  Affect:  Inappropriate  Thought Process:  Disorganized  Orientation:  Full (Time, Place, and Person)  Thought Content:  Illogical,  Delusions, Paranoid Ideation and Rumination  Suicidal Thoughts:  Yes.  with intent/plan  Homicidal Thoughts:  No  Memory:  Immediate;   Fair Recent;   Fair Remote;   Fair  Judgement:  Impaired  Insight:  Shallow  Psychomotor Activity:  Restlessness  Concentration:  Concentration: Poor  Recall:  [employer] of Knowledge:  Fair  Language:  Fair  Akathisia:  No  Handed:  Right  AIMS (if indicated):     Assets:  Desire for Improvement Housing Physical Health Resilience Social Support  ADL's:  Intact  Cognition:  WNL  Sleep:  Number of Hours: 7    Treatment Plan Summary: Daily contact with patient to assess and evaluate symptoms and progress in treatment, Medication management and Plan Patient and I reviewed medication strategies.  We agreed that we will try going back to a higher dose of haloperidol since that he had clear efficacy.  I also suggested to her that long-acting injectables would be appropriate in her circumstance.  She did not want to discuss that but we will keep it on the back burner.  Continue 15-minute checks continue engagement in daily assessment and group and individual  therapy.  Treatment team meeting tomorrow.  Observation Level/Precautions:  15 minute checks  Laboratory:  Chemistry Profile  Psychotherapy:    Medications:    Consultations:    Discharge Concerns:    Estimated LOS:  Other:     Physician Treatment Plan for Primary Diagnosis: Bipolar affective disorder, current episode manic with psychotic symptoms (HCC) Long Term Goal(s): Improvement in symptoms so as ready for discharge  Short Term Goals: Ability to disclose and discuss suicidal ideas, Ability to demonstrate self-control will improve and Ability to identify and develop effective coping behaviors will improve  Physician Treatment Plan for Secondary Diagnosis: Principal Problem:   Bipolar affective disorder, current episode manic with psychotic symptoms (HCC) Active Problems:   Acute psychosis (HCC)  Long Term Goal(s): Improvement in symptoms so as ready for discharge  Short Term Goals: Ability to maintain clinical measurements within normal limits will improve and Compliance with prescribed medications will improve  I certify that inpatient services furnished can reasonably be expected to improve the patient's condition.    Mordecai Rasmussen, MD 6/30/20213:38 PM

## 2020-04-02 NOTE — BHH Group Notes (Signed)
LCSW Group Therapy Note  04/02/2020 2:56 PM  Type of Therapy/Topic:  Group Therapy:  Emotion Regulation  Participation Level:  Did Not Attend   Description of Group:   The purpose of this group is to assist patients in learning to regulate negative emotions and experience positive emotions. Patients will be guided to discuss ways in which they have been vulnerable to their negative emotions. These vulnerabilities will be juxtaposed with experiences of positive emotions or situations, and patients will be challenged to use positive emotions to combat negative ones. Special emphasis will be placed on coping with negative emotions in conflict situations, and patients will process healthy conflict resolution skills.  Therapeutic Goals: 1. Patient will identify two positive emotions or experiences to reflect on in order to balance out negative emotions 2. Patient will label two or more emotions that they find the most difficult to experience 3. Patient will demonstrate positive conflict resolution skills through discussion and/or role plays  Summary of Patient Progress: X  Therapeutic Modalities:   Cognitive Behavioral Therapy Feelings Identification Dialectical Behavioral Therapy  Penni Homans, MSW, LCSW 04/02/2020 2:56 PM

## 2020-04-02 NOTE — Progress Notes (Signed)
D- Patient alert and oriented. Affect/mood is irritable and anxious. Pt denies SI, HI, AVH, and pain.   A- Scheduled medications administered to patient, per MD orders. Support and encouragement provided.  Routine safety checks conducted every 15 minutes.  Patient informed to notify staff with problems or concerns.  R- No adverse drug reactions noted. Patient contracts for safety at this time. Patient compliant with medications and treatment plan. Patient receptive, calm, and cooperative. Patient interacts well with others on the unit.  Patient remains safe at this time.  Torrie Mayers RN

## 2020-04-02 NOTE — Plan of Care (Signed)
Pt rates depression and hopelessness both 5/10 and anxiety 8/10. Pt refuses SI, HI and AVH. Pt was educated on care plan and verbalizes understanding. Pt was encouraged to attend groups. Torrie Mayers RN Problem: Education: Goal: Ability to make informed decisions regarding treatment will improve Outcome: Progressing   Problem: Coping: Goal: Coping ability will improve Outcome: Progressing   Problem: Health Behavior/Discharge Planning: Goal: Identification of resources available to assist in meeting health care needs will improve Outcome: Progressing   Problem: Medication: Goal: Compliance with prescribed medication regimen will improve Outcome: Progressing   Problem: Self-Concept: Goal: Ability to disclose and discuss suicidal ideas will improve Outcome: Progressing Goal: Will verbalize positive feelings about self Outcome: Progressing   Problem: Education: Goal: Knowledge of [facility] Education information/materials will improve Outcome: Progressing Goal: Emotional status will improve Outcome: Not Progressing Goal: Mental status will improve Outcome: Not Progressing Goal: Verbalization of understanding the information provided will improve Outcome: Progressing   Problem: Activity: Goal: Interest or engagement in activities will improve Outcome: Progressing Goal: Sleeping patterns will improve Outcome: Progressing   Problem: Coping: Goal: Ability to verbalize frustrations and anger appropriately will improve Outcome: Progressing Goal: Ability to demonstrate self-control will improve Outcome: Progressing   Problem: Health Behavior/Discharge Planning: Goal: Identification of resources available to assist in meeting health care needs will improve Outcome: Progressing Goal: Compliance with treatment plan for underlying cause of condition will improve Outcome: Progressing   Problem: Physical Regulation: Goal: Ability to maintain clinical measurements within  normal limits will improve Outcome: Progressing   Problem: Safety: Goal: Periods of time without injury will increase Outcome: Progressing

## 2020-04-02 NOTE — Progress Notes (Signed)
Recreation Therapy Notes   Date: 04/02/2020  Time: 9:30 am  Location: Craft room   Behavioral response: Appropriate  Intervention Topic: Communication    Discussion/Intervention:  Group content today was focused on communication. The group defined communication and ways to communicate with others. Individuals stated reason why communication is important and some reasons to communicate with others. Patients expressed if they thought they were good at communicating with others and ways they could improve their communication skills. The group identified important parts of communication and some experiences they have had in the past with communication. The group participated in the intervention "Scattergories", where they had a chance to test out their communication skills and identify ways to improve their communication techniques.  Clinical Observations/Feedback:  Patient came to group and defined communication as getting ideas across .She stated that she communicates by hugging, calls and flowers. Participant expressed that someone not listening or wanting to talk about problems is a deal breaker when she communicates with others. Individual was social with peers and staff while participating in the intervention.  Paticia Moster LRT/CTRS          Alleah Dearman 04/02/2020 12:02 PM

## 2020-04-02 NOTE — Plan of Care (Signed)
  Problem: Coping: Goal: Coping ability will improve Outcome: Progressing   Problem: Medication: Goal: Compliance with prescribed medication regimen will improve Outcome: Progressing   Problem: Education: Goal: Mental status will improve Outcome: Not Progressing   Problem: Activity: Goal: Interest or engagement in activities will improve Outcome: Progressing   Problem: Coping: Goal: Ability to demonstrate self-control will improve Outcome: Progressing   Problem: Safety: Goal: Periods of time without injury will increase Outcome: Progressing

## 2020-04-02 NOTE — Progress Notes (Signed)
Recreation Therapy Notes  INPATIENT RECREATION THERAPY ASSESSMENT  Patient Details Name: Tanya Simpson MRN: [id number] DOB: 03/23/1961 Today's Date: 04/02/2020       Information Obtained From: Patient  Able to Participate in Assessment/Interview: Yes  Patient Presentation: Responsive  Reason for Admission (Per Patient): Active Symptoms, Suicidal Ideation, Suicide Attempt  Patient Stressors:    Coping Skills:   Music, Meditate  Leisure Interests (2+):  Individual - Reading  Frequency of Recreation/Participation: Weekly  Awareness of Community Resources:     [employer]:     Current Use:    If no, Barriers?:    Expressed Interest in [employer] Information:    [place] of Residence:  [place]  Patient Main Form of Transportation: Set designer  Patient Strengths:  Patient, Forensic scientist, Honest  Patient Identified Areas of Improvement:  Take better care of myself  Patient Goal for Hospitalization:  To get some resources  Current SI (including self-harm):  No  Current HI:  No  Current AVH: No  Staff Intervention Plan: Group Attendance, Collaborate with Interdisciplinary Treatment Team  Consent to Intern Participation: N/A  Yasseen Salls 04/02/2020, 1:09 PM

## 2020-04-02 NOTE — BHH Counselor (Signed)
CSW spoke with Jola Babinski, scheduler at [facility] who reports the pt is scheduled to meet with Dr. Corie Chiquito on 7/23. She states the pt will be placed on cancellation list and contacted if earlier appointment is available. Jola Babinski reports Dr. Montez Morita will be on vacation next week and has no availability the following week to meet with the pt. She also reports the pt will need to discuss with Dr. Montez Morita during her visit being referred for in house outpatient therapist. CSW will relay this information to the pt.

## 2020-04-02 NOTE — BHH Suicide Risk Assessment (Signed)
[facility] INPATIENT:  Family/Significant Other Suicide Prevention Education  Suicide Prevention Education:  Patient Refusal for Family/Significant Other Suicide Prevention Education: The patient Tanya Simpson has refused to provide written consent for family/significant other to be provided Family/Significant Other Suicide Prevention Education during admission and/or prior to discharge.  Physician notified.  Latroya Ng T Dani Danis 04/02/2020, 2:13 PM

## 2020-04-02 NOTE — Progress Notes (Signed)
Patient is a new admit on the unit. Patient so far has been isolative to her room. She is pleasant and easy to engage. She is clear and coherent in thought and speech. Patient advised that she has no current medication orders. LPN inquired if she would need any meds to help with any active symptoms she may be experiencing or if she needed and aid in sleep. Patient declined the need for meds stating she was ok and would have no problem getting to sleep. Patient informed to contact staff with any concerns.  She is safe on the unit with 15 minute safety checks.   Cleo Butler-Nicholson, LPN

## 2020-04-02 NOTE — Plan of Care (Signed)
Patient is a new admit.  Problem: Education: Goal: Ability to make informed decisions regarding treatment will improve Outcome: Not Progressing   Problem: Coping: Goal: Coping ability will improve Outcome: Not Progressing   Problem: Health Behavior/Discharge Planning: Goal: Identification of resources available to assist in meeting health care needs will improve Outcome: Not Progressing   Problem: Medication: Goal: Compliance with prescribed medication regimen will improve Outcome: Not Progressing   Problem: Self-Concept: Goal: Ability to disclose and discuss suicidal ideas will improve Outcome: Not Progressing Goal: Will verbalize positive feelings about self Outcome: Not Progressing   Problem: Education: Goal: Knowledge of [place] [facility] Education information/materials will improve Outcome: Not Progressing Goal: Emotional status will improve Outcome: Not Progressing Goal: Mental status will improve Outcome: Not Progressing Goal: Verbalization of understanding the information provided will improve Outcome: Not Progressing   Problem: Activity: Goal: Interest or engagement in activities will improve Outcome: Not Progressing Goal: Sleeping patterns will improve Outcome: Not Progressing   Problem: Coping: Goal: Ability to verbalize frustrations and anger appropriately will improve Outcome: Not Progressing Goal: Ability to demonstrate self-control will improve Outcome: Not Progressing   Problem: Health Behavior/Discharge Planning: Goal: Identification of resources available to assist in meeting health care needs will improve Outcome: Not Progressing Goal: Compliance with treatment plan for underlying cause of condition will improve Outcome: Not Progressing   Problem: Physical Regulation: Goal: Ability to maintain clinical measurements within normal limits will improve Outcome: Not Progressing   Problem: Safety: Goal: Periods of time without injury will  increase Outcome: Not Progressing
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
## 2020-04-02 NOTE — BHH Suicide Risk Assessment (Signed)
[facility] Admission Suicide Risk Assessment   Nursing information obtained from:  Patient Demographic factors:  Caucasian, Unemployed Current Mental Status:  Self-harm thoughts, Self-harm behaviors Loss Factors:  Decrease in vocational status Historical Factors:  Prior suicide attempts Risk Reduction Factors:  Sense of responsibility to family, Living with another person, especially a relative  Total Time spent with patient: 1 hour Principal Problem: Bipolar affective disorder, current episode manic with psychotic symptoms (HCC) Diagnosis:  Principal Problem:   Bipolar affective disorder, current episode manic with psychotic symptoms (HCC) Active Problems:   Acute psychosis (HCC)  Subjective Data: Patient seen chart reviewed.  Also spoke with the patient's consent to her husband.  59 year old woman transferred to [place] from [place].  She had been found passed out at home by her husband after taking a large amount of prescription medicine in what she admits was a suicide attempt.  Patient is disorganized in her thinking with obvious and widespread paranoia.  Denies current suicidal intent but has very poor insight and judgment.  Continued Clinical Symptoms:  Alcohol Use Disorder Identification Test Final Score (AUDIT): 0 The "Alcohol Use Disorders Identification Test", Guidelines for Use in Primary Care, Second Edition.  World Science writer [facility]). Score between 0-7:  no or low risk or alcohol related problems. Score between 8-15:  moderate risk of alcohol related problems. Score between 16-19:  high risk of alcohol related problems. Score 20 or above:  warrants further diagnostic evaluation for alcohol dependence and treatment.   CLINICAL FACTORS:   Bipolar Disorder:   Mixed State   Musculoskeletal: Strength & Muscle Tone: within normal limits Gait & Station: normal Patient leans: N/A  Psychiatric Specialty Exam: Physical Exam Constitutional:      Appearance: She is  well-developed.  HENT:     Head: Normocephalic and atraumatic.  Eyes:     Conjunctiva/sclera: Conjunctivae normal.     Pupils: Pupils are equal, round, and reactive to light.  Cardiovascular:     Heart sounds: Normal heart sounds.  Pulmonary:     Effort: Pulmonary effort is normal.  Abdominal:     Palpations: Abdomen is soft.  Musculoskeletal:        General: Normal range of motion.     Cervical back: Normal range of motion.  Skin:    General: Skin is warm and dry.  Neurological:     General: No focal deficit present.     Mental Status: She is alert.  Psychiatric:        Attention and Perception: She is inattentive.        Mood and Affect: Mood is anxious.        Speech: Speech is rapid and pressured and tangential.        Behavior: Behavior is agitated. Behavior is not aggressive.        Thought Content: Thought content is paranoid and delusional.        Cognition and Memory: Cognition is impaired. Memory is impaired.        Judgment: Judgment is inappropriate.     Review of Systems  Constitutional: Negative.   HENT: Negative.   Eyes: Negative.   Respiratory: Negative.   Cardiovascular: Negative.   Gastrointestinal: Negative.   Musculoskeletal: Negative.   Skin: Negative.   Neurological: Negative.   Psychiatric/Behavioral: Positive for confusion, hallucinations and sleep disturbance. The patient is nervous/anxious.     Blood pressure 133/76, pulse 92, temperature 98.5 F (36.9 C), temperature source Oral, resp. rate 18, height 5\' 3"  (1.6 m),  weight 66.7 kg, SpO2 100 %.Body mass index is 26.04 kg/m.  General Appearance: Casual  Eye Contact:  Fair  Speech:  Pressured  Volume:  Increased  Mood:  Anxious and Dysphoric  Affect:  Inappropriate  Thought Process:  Disorganized  Orientation:  Full (Time, Place, and Person)  Thought Content:  Illogical, Delusions, Ideas of Reference:   Paranoia, Paranoid Ideation and Tangential  Suicidal Thoughts:  Yes.  with intent/plan   Homicidal Thoughts:  No  Memory:  Immediate;   Fair Recent;   Fair Remote;   Fair  Judgement:  Impaired  Insight:  Lacking  Psychomotor Activity:  Decreased  Concentration:  Concentration: Poor  Recall:  [employer] of Knowledge:  Fair  Language:  Fair  Akathisia:  No  Handed:  Right  AIMS (if indicated):     Assets:  Desire for Improvement Financial Resources/Insurance Housing Physical Health Resilience Social Support  ADL's:  Impaired  Cognition:  Impaired,  Mild  Sleep:  Number of Hours: 7      COGNITIVE FEATURES THAT CONTRIBUTE TO RISK:  Polarized thinking    SUICIDE RISK:   Moderate:  Frequent suicidal ideation with limited intensity, and duration, some specificity in terms of plans, no associated intent, good self-control, limited dysphoria/symptomatology, some risk factors present, and identifiable protective factors, including available and accessible social support.  PLAN OF CARE: Continue 15-minute checks.  Reinitiate more effective antipsychotic treatment.  Engage in individual and group therapy with daily assessment.  Try to arrange for long-acting injectables if possible before discharge planning  I certify that inpatient services furnished can reasonably be expected to improve the patient's condition.   Mordecai Rasmussen, MD 04/02/2020, 3:32 PM

## 2020-04-02 NOTE — Progress Notes (Signed)
Patient continues to be paranoid and manic. Patient questioning medication, afraid to take it reports she Overdosed on it. Patient did take medication, came back in 2 minutes stating it was making her legs numb and feel funny. Patient reports wanting to sit up for awhile. Patient currently in dayroom reading. Denies SI, HI, AVH.  Encouragement and support provided. Safety checks maintained. Medications given as prescribed. Pt receptive and remains safe on unit with q 15 min checks.

## 2020-04-03 LAB — LIPID PANEL
Cholesterol: 206 mg/dL — ABNORMAL HIGH (ref 0–200)
HDL: 62 mg/dL (ref >40)
LDL Cholesterol: 126 mg/dL — ABNORMAL HIGH (ref 0–99)
Total CHOL/HDL Ratio: 3.3 RATIO
Triglycerides: 91 mg/dL (ref <150)
VLDL: 18 mg/dL (ref 0–40)

## 2020-04-03 LAB — VITAMIN B1: Vitamin B1 (Thiamine): 218.1 nmol/L — ABNORMAL HIGH (ref 66.5–200.0)

## 2020-04-03 NOTE — Tx Team (Addendum)
Interdisciplinary Treatment and Diagnostic Plan Update  04/03/2020 Time of Session: 9:00AM Tanya Simpson MRN: [id number]  Principal Diagnosis: Bipolar affective disorder, current episode manic with psychotic symptoms (HCC)  Secondary Diagnoses: Principal Problem:   Bipolar affective disorder, current episode manic with psychotic symptoms (HCC) Active Problems:   Acute psychosis (HCC)   Current Medications:  Current Facility-Administered Medications  Medication Dose Route Frequency Provider Last Rate Last Admin  . acetaminophen (TYLENOL) tablet 650 mg  650 mg Oral Q6H PRN Clapacs, John T, MD      . alum & mag hydroxide-simeth (MAALOX/MYLANTA) 200-200-20 MG/5ML suspension 30 mL  30 mL Oral Q4H PRN Clapacs, John T, MD      . benztropine (COGENTIN) tablet 1 mg  1 mg Oral BID Clapacs, John T, MD      . haloperidol (HALDOL) tablet 10 mg  10 mg Oral QHS Clapacs, John T, MD   10 mg at 04/02/20 2107  . haloperidol (HALDOL) tablet 5 mg  5 mg Oral QAC breakfast Clapacs, John T, MD      . lamoTRIgine (LAMICTAL) tablet 100 mg  100 mg Oral QHS Clapacs, John T, MD   100 mg at 04/02/20 2108  . magnesium hydroxide (MILK OF MAGNESIA) suspension 30 mL  30 mL Oral Daily PRN Clapacs, Jackquline Denmark, MD       PTA Medications: Medications Prior to Admission  Medication Sig Dispense Refill Last Dose  . loratadine (CLARITIN) 10 MG tablet Take 1 tablet (10 mg total) by mouth daily. 90 tablet 2   . Multiple Vitamin (MULTIVITAMIN) tablet Take 1 tablet by mouth every other day.       Patient Stressors:    Patient Strengths:    Treatment Modalities: Medication Management, Group therapy, Case management,  1 to 1 session with clinician, Psychoeducation, Recreational therapy.   Physician Treatment Plan for Primary Diagnosis: Bipolar affective disorder, current episode manic with psychotic symptoms (HCC) Long Term Goal(s): Improvement in symptoms so as ready for discharge Improvement in symptoms so as ready for  discharge   Short Term Goals: Ability to disclose and discuss suicidal ideas Ability to demonstrate self-control will improve Ability to identify and develop effective coping behaviors will improve Ability to maintain clinical measurements within normal limits will improve Compliance with prescribed medications will improve  Medication Management: Evaluate patient's response, side effects, and tolerance of medication regimen.  Therapeutic Interventions: 1 to 1 sessions, Unit Group sessions and Medication administration.  Evaluation of Outcomes: Not Progressing  Physician Treatment Plan for Secondary Diagnosis: Principal Problem:   Bipolar affective disorder, current episode manic with psychotic symptoms (HCC) Active Problems:   Acute psychosis (HCC)  Long Term Goal(s): Improvement in symptoms so as ready for discharge Improvement in symptoms so as ready for discharge   Short Term Goals: Ability to disclose and discuss suicidal ideas Ability to demonstrate self-control will improve Ability to identify and develop effective coping behaviors will improve Ability to maintain clinical measurements within normal limits will improve Compliance with prescribed medications will improve     Medication Management: Evaluate patient's response, side effects, and tolerance of medication regimen.  Therapeutic Interventions: 1 to 1 sessions, Unit Group sessions and Medication administration.  Evaluation of Outcomes: Not Progressing   RN Treatment Plan for Primary Diagnosis: Bipolar affective disorder, current episode manic with psychotic symptoms (HCC) Long Term Goal(s): Knowledge of disease and therapeutic regimen to maintain health will improve  Short Term Goals: Ability to demonstrate self-control, Ability to participate in decision making will  improve, Ability to verbalize feelings will improve, Ability to disclose and discuss suicidal ideas, Ability to identify and develop effective coping  behaviors will improve and Compliance with prescribed medications will improve  Medication Management: RN will administer medications as ordered by provider, will assess and evaluate patient's response and provide education to patient for prescribed medication. RN will report any adverse and/or side effects to prescribing provider.  Therapeutic Interventions: 1 on 1 counseling sessions, Psychoeducation, Medication administration, Evaluate responses to treatment, Monitor vital signs and CBGs as ordered, Perform/monitor CIWA, COWS, AIMS and Fall Risk screenings as ordered, Perform wound care treatments as ordered.  Evaluation of Outcomes: Not Progressing   LCSW Treatment Plan for Primary Diagnosis: Bipolar affective disorder, current episode manic with psychotic symptoms (HCC) Long Term Goal(s): Safe transition to appropriate next level of care at discharge, Engage patient in therapeutic group addressing interpersonal concerns.  Short Term Goals: Engage patient in aftercare planning with referrals and resources, Increase social support, Increase ability to appropriately verbalize feelings, Increase emotional regulation, Facilitate acceptance of mental health diagnosis and concerns and Increase skills for wellness and recovery  Therapeutic Interventions: Assess for all discharge needs, 1 to 1 time with Social worker, Explore available resources and support systems, Assess for adequacy in community support network, Educate family and significant other(s) on suicide prevention, Complete Psychosocial Assessment, Interpersonal group therapy.  Evaluation of Outcomes: Not Progressing   Progress in Treatment: Attending groups: No. Participating in groups: No. Taking medication as prescribed: No. Toleration medication: No. Family/Significant other contact made: Yes, individual(s) contacted:  SPE completed with patient, pt declined for CSW contact with collaterals. Patient understands diagnosis:  Yes. Discussing patient identified problems/goals with staff: Yes. Medical problems stabilized or resolved: Yes. Denies suicidal/homicidal ideation: Yes. Issues/concerns per patient self-inventory: No. Other: none  New problem(s) identified: No, Describe:  none  New Short Term/Long Term Goal(s): detox, elimination of symptoms of psychosis, medication management for mood stabilization; elimination of SI thoughts; development of comprehensive mental wellness/sobriety plan.  Patient Goals:  "go home"  Discharge Plan or Barriers:  Patient reports plans to return home and continue aftercare treatment with current mental health providers at [facility].   Reason for Continuation of Hospitalization: Anxiety Depression Medication stabilization Suicidal ideation  Estimated Length of Stay: 1-7 days  Recreational Therapy: Patient: N/A Patient Goal: Patient will engage in groups without prompting or encouragement from LRT x3 group sessions within 5 recreation therapy group sessions  Attendees: Patient: Tanya Simpson 04/03/2020 10:17 AM  Physician: Dr. Toni Amend, MD 04/03/2020 10:17 AM  Nursing: Torrie Mayers, RN 04/03/2020 10:17 AM  RN Care Manager: 04/03/2020 10:17 AM  Social Worker: Penni Homans, LCSW 04/03/2020 10:17 AM  Recreational Therapist: Garret Reddish, Drue Flirt, LRT 04/03/2020 10:17 AM  Other: Lowella Dandy, LCSW 04/03/2020 10:17 AM  Other:  04/03/2020 10:17 AM  Other: 04/03/2020 10:17 AM    Scribe for Treatment Team: Harden Mo, LCSW 04/03/2020 10:17 AM

## 2020-04-03 NOTE — Plan of Care (Signed)
Pt rates hopelessness 5/10 and anxiety 8/10. Pt denies Si, HI and AVH. Pt was educated on care plan and verbalizes understanding. Pt was encouraged to attend groups. Torrie Mayers RN Problem: Education: Goal: Ability to make informed decisions regarding treatment will improve Outcome: Not Progressing   Problem: Coping: Goal: Coping ability will improve Outcome: Not Progressing   Problem: Health Behavior/Discharge Planning: Goal: Identification of resources available to assist in meeting health care needs will improve Outcome: Not Progressing   Problem: Medication: Goal: Compliance with prescribed medication regimen will improve Outcome: Not Progressing   Problem: Self-Concept: Goal: Ability to disclose and discuss suicidal ideas will improve Outcome: Progressing Goal: Will verbalize positive feelings about self Outcome: Not Progressing   Problem: Education: Goal: Knowledge of [facility] Education information/materials will improve Outcome: Not Progressing Goal: Emotional status will improve Outcome: Not Progressing Goal: Mental status will improve Outcome: Not Progressing Goal: Verbalization of understanding the information provided will improve Outcome: Not Progressing   Problem: Activity: Goal: Interest or engagement in activities will improve Outcome: Progressing Goal: Sleeping patterns will improve Outcome: Progressing   Problem: Coping: Goal: Ability to verbalize frustrations and anger appropriately will improve Outcome: Progressing Goal: Ability to demonstrate self-control will improve Outcome: Progressing   Problem: Health Behavior/Discharge Planning: Goal: Identification of resources available to assist in meeting health care needs will improve Outcome: Not Progressing Goal: Compliance with treatment plan for underlying cause of condition will improve Outcome: Not Progressing   Problem: Physical Regulation: Goal: Ability to maintain clinical  measurements within normal limits will improve Outcome: Not Progressing   Problem: Safety: Goal: Periods of time without injury will increase Outcome: Progressing

## 2020-04-03 NOTE — [facility] Group Notes (Signed)
Overcoming Obstacles  04/03/2020 1PM  Type of Therapy and Topic:  Group Therapy:  Overcoming Obstacles  Participation Level:  Did Not Attend    Description of Group:    In this group patients will be encouraged to explore what they see as obstacles to their own wellness and recovery. They will be guided to discuss their thoughts, feelings, and behaviors related to these obstacles. The group will process together ways to cope with barriers, with attention given to specific choices patients can make. Each patient will be challenged to identify changes they are motivated to make in order to overcome their obstacles. This group will be process-oriented, with patients participating in exploration of their own experiences as well as giving and receiving support and challenge from other group members.   Therapeutic Goals: 1. Patient will identify personal and current obstacles as they relate to admission. 2. Patient will identify barriers that currently interfere with their wellness or overcoming obstacles.  3. Patient will identify feelings, thought process and behaviors related to these barriers. 4. Patient will identify two changes they are willing to make to overcome these obstacles:      Summary of Patient Progress     Therapeutic Modalities:   Cognitive Behavioral Therapy Solution Focused Therapy Motivational Interviewing Relapse Prevention Therapy    Lowella Dandy, MSW, LCSW 04/03/2020 1:57 PM

## 2020-04-03 NOTE — Progress Notes (Signed)
[facility]  04/03/2020 10:54 AM Tanya Sermonserri Mccleese  MRN:  [id number] Subjective: Follow-up for this 59 year old woman with bipolar disorder depressed with psychotic features.  Patient seen in treatment team and seen individually.  Her insight is not very good.  Once again she is focused entirely on discharge.  This despite the fact that she acknowledges she had serious suicidal intent and that she remains very anxious and paranoid.  Patient is clearly paranoid of things going on around her.  Not sleeping well.  She refused to take her medicine this morning claiming that it made her "sick last night".  Her complaints of how she was sick are mostly about stomach upset but also about some leg pain and various seemingly unrelated things that are not likely to be side effects.  During conversation she is disorganized and rambling and it is clear that she remains paranoid of her husband and others. Principal Problem: Bipolar affective disorder, current episode manic with psychotic symptoms (HCC) Diagnosis: Principal Problem:   Bipolar affective disorder, current episode manic with psychotic symptoms (HCC) Active Problems:   Acute psychosis (HCC)  Total Time spent with patient: 30 minutes  Past Psychiatric History: Past history of multiple prior hospitalizations serious suicide attempts longstanding psychotic disorder  Past Medical History:  Past Medical History:  Diagnosis Date  . Allergic rhinitis   . Anxiety   . Depression   . Schizophrenia [facility](HCC)     Past Surgical History:  Procedure Laterality Date  . TONSILLECTOMY     Family History:  Family History  Problem Relation Age of Onset  . Hypertension Mother   . Impulse control disorder Mother   . CVA Father   . Depression Father    Family Psychiatric  History: See previous Social History:  Social History   Substance and Sexual Activity  Alcohol Use Yes  . Alcohol/week: 1.0 standard drink  . Types: 1 Glasses of wine per week    Comment: very little     Social History   Substance and Sexual Activity  Drug Use No    Social History   Socioeconomic History  . Marital status: Married    Spouse name: Onalee HuaDavid  . Number of children: 2  . Years of education: 16  . Highest education level: Bachelor's degree (e.g., BA, AB, BS)  Occupational History  . Occupation: [employer]  Tobacco Use  . Smoking status: Former Smoker    Packs/day: 1.00    Years: 1.00    Pack years: 1.00  . Smokeless tobacco: Never Used  Vaping Use  . Vaping Use: Never used  Substance and Sexual Activity  . Alcohol use: Yes    Alcohol/week: 1.0 standard drink    Types: 1 Glasses of wine per week    Comment: very little  . Drug use: No  . Sexual activity: Yes    Partners: Male  Other Topics Concern  . Not on file  Social History Narrative   [place], his name is Onalee HuaDavid.    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    [place].       Hopeful to get back to exercising once the weather cools down.    She was participating in [employer] wat[employer], however the pandemic has made this difficult for her.    Social Determinants of Health   Financial Resource Strain: Low Risk   . Difficulty of Paying Living Expenses: Not hard  at all  Food Insecurity: Food Insecurity Present  . Worried About Programme researcher, broadcasting/film/video in the Last Year: Sometimes true  . Ran Out of Food in the Last Year: Sometimes true  Transportation Needs: No Transportation Needs  . Lack of Transportation (Medical): No  . Lack of Transportation (Non-Medical): No  Physical Activity: Insufficiently Active  . Days of Exercise per Week: 7 days  . Minutes of Exercise per Session: 10 min  Stress: Stress Concern Present  . Feeling of Stress : To some extent  Social Connections: Moderately Isolated  . Frequency of Communication with Friends and Family: Once a week  . Frequency of Social Gatherings with Friends and Family:  Never  . Attends Religious Services: More than 4 times per year  . Active Member of Clubs or Organizations: No  . Attends Banker Meetings: Never  . Marital Status: Married   Additional Social History:                         Sleep: Fair  Appetite:  Fair  Current Medications: Current Facility-Administered Medications  Medication Dose Route Frequency Provider Last Rate Last Admin  . acetaminophen (TYLENOL) tablet 650 mg  650 mg Oral Q6H PRN Dat Derksen T, MD      . alum & mag hydroxide-simeth (MAALOX/MYLANTA) 200-200-20 MG/5ML suspension 30 mL  30 mL Oral Q4H PRN Lera Gaines T, MD      . benztropine (COGENTIN) tablet 1 mg  1 mg Oral BID Paytin Ramakrishnan T, MD      . haloperidol (HALDOL) tablet 10 mg  10 mg Oral QHS Trimaine Maser T, MD   10 mg at 04/02/20 2107  . haloperidol (HALDOL) tablet 5 mg  5 mg Oral QAC breakfast Zoi Devine T, MD      . lamoTRIgine (LAMICTAL) tablet 100 mg  100 mg Oral QHS Dariyah Garduno T, MD   100 mg at 04/02/20 2108  . magnesium hydroxide (MILK OF MAGNESIA) suspension 30 mL  30 mL Oral Daily PRN Amarah Brossman, Jackquline Denmark, MD        Lab Results:  Results for orders placed or performed during the hospital encounter of 04/01/20 (from the past 48 hour(s))  Lipid panel     Status: Abnormal   Collection Time: 04/03/20  7:46 AM  Result Value Ref Range   Cholesterol 206 (H) 0 - 200 mg/dL   Triglycerides 91 <256 mg/dL   HDL 62 >38 mg/dL   Total CHOL/HDL Ratio 3.3 RATIO   VLDL 18 0 - 40 mg/dL   LDL Cholesterol 937 (H) 0 - 99 mg/dL    Comment:        Total Cholesterol/HDL:CHD Risk Coronary Heart Disease Risk Table                     Men   Women  1/2 Average Risk   3.4   3.3  Average Risk       5.0   4.4  2 X Average Risk   9.6   7.1  3 X Average Risk  23.4   11.0        Use the calculated Patient Ratio above and the CHD Risk Table to determine the patient's CHD Risk.        ATP III CLASSIFICATION (LDL):  <100     mg/dL   Optimal   342-876  mg/dL   Near or Above  Optimal  130-159  mg/dL   Borderline  026-378  mg/dL   High  >588     mg/dL   Very High Performed at [facility], [address] [place], [place] [address]     Blood Alcohol level:  Lab Results  Component Value Date   [facility] <10 03/28/2020    Metabolic Disorder Labs: No results found for: HGBA1C, MPG No results found for: PROLACTIN Lab Results  Component Value Date   CHOL 206 (H) 04/03/2020   TRIG 91 04/03/2020   HDL 62 04/03/2020   CHOLHDL 3.3 04/03/2020   VLDL 18 04/03/2020   LDLCALC 126 (H) 04/03/2020   LDLCALC 107 (H) 03/26/2020    Physical Findings: AIMS:  , ,  ,  ,    CIWA:    COWS:     Musculoskeletal: Strength & Muscle Tone: within normal limits Gait & Station: normal Patient leans: N/A  Psychiatric Specialty Exam: Physical Exam Vitals and nursing note reviewed.  Constitutional:      Appearance: She is well-developed.  HENT:     Head: Normocephalic and atraumatic.  Eyes:     Conjunctiva/sclera: Conjunctivae normal.     Pupils: Pupils are equal, round, and reactive to light.  Cardiovascular:     Heart sounds: Normal heart sounds.  Pulmonary:     Effort: Pulmonary effort is normal.  Abdominal:     Palpations: Abdomen is soft.  Musculoskeletal:        General: Normal range of motion.     Cervical back: Normal range of motion.  Skin:    General: Skin is warm and dry.  Neurological:     General: No focal deficit present.     Mental Status: She is alert.  Psychiatric:        Attention and Perception: She is inattentive.        Mood and Affect: Mood is anxious. Affect is inappropriate.        Speech: Speech is tangential.        Behavior: Behavior is agitated. Behavior is not aggressive.        Thought Content: Thought content is paranoid and delusional.        Cognition and Memory: Memory is impaired.        Judgment: Judgment is inappropriate.     Review of Systems  Constitutional:  Negative.   HENT: Negative.   Eyes: Negative.   Respiratory: Negative.   Cardiovascular: Negative.   Gastrointestinal: Negative.   Musculoskeletal: Negative.   Skin: Negative.   Neurological: Negative.   Psychiatric/Behavioral: Positive for confusion, dysphoric mood and sleep disturbance. The patient is nervous/anxious.     Blood pressure 124/84, pulse 93, temperature 98.6 F (37 C), temperature source Oral, resp. rate 17, height 5\' 3"  (1.6 m), weight 66.7 kg, SpO2 98 %.Body mass index is 26.04 kg/m.  General Appearance: Casual  Eye Contact:  Minimal  Speech:  Garbled  Volume:  Increased  Mood:  Anxious  Affect:  Constricted  Thought Process:  Disorganized  Orientation:  Full (Time, Place, and Person)  Thought Content:  Illogical, Paranoid Ideation and Tangential  Suicidal Thoughts:  Yes.  without intent/plan  Homicidal Thoughts:  No  Memory:  Immediate;   Fair Recent;   Fair Remote;   Fair  Judgement:  Impaired  Insight:  Shallow  Psychomotor Activity:  Restlessness  Concentration:  Concentration: Poor  Recall:  of Knowledge:  Fair  Language:  Fair  Akathisia:  No  Handed:  Right  AIMS (if indicated):     Assets:  Desire for Improvement Financial Resources/Insurance Housing Physical Health Resilience Social Support  ADL's:  Intact  Cognition:  Impaired,  Mild  Sleep:  Number of Hours: 7     Treatment Plan Summary: Daily contact with patient to assess and evaluate symptoms and progress in treatment, Medication management and Plan This patient continues to have multiple risk factors making her high risk for suicide outside the hospital.  History of repeated suicide attempts including a recent serious suicide attempt.  Persistence of psychotic symptoms with paranoia including paranoia of people in her immediate life such as her husband and son.  Poor insight poor compliance with medication treatment.  On the plus side she has a history of good response to  medication in the past.  I talked with her again about how there were several medicines that could be helpful but that I had chosen Haldol based on her previous history of good response.  I am also choosing this at least in part because I am hoping we can get her onto a long-acting injectable before discharge.  Patient was hesitant about medication mostly for what seems like paranoid reasons.  She ultimately agreed to be compliant with oral medicine.  No other change in treatment plan for today.  Tanya Rasmussen, MD 04/03/2020, 10:54 AM

## 2020-04-03 NOTE — Progress Notes (Signed)
BRIEF PHARMACY NOTE   This patient attended and participated in Medication Management Group counseling led by [facility] staff pharmacist.  This interactive class reviews basic information about prescription medications and education on personal responsibility in medication management.  The class also includes general knowledge of 3 main classes of behavioral medications, including antipsychotics, antidepressants, and mood stabilizers.     Patient behavior was appropriate for group setting.   Educational materials sourced from:  "Medication Do's and Don'ts" from [email or web address]   "Mental Health Medications" from [facility] FaxRack.[email or web address].shtml#part 599774     Albina Billet, PharmD, BCPS Clinical Pharmacist 04/03/2020 3:20 PM

## 2020-04-03 NOTE — Progress Notes (Signed)
Recreation Therapy Notes   Date: 04/03/2020  Time: 9:30 am   Location: Craft room   Behavioral response: N/A   Intervention Topic: Leisure  Discussion/Intervention: Patient did not attend group.   Clinical Observations/Feedback:  Patient did not attend group.   Joud Ingwersen LRT/CTRS        Dominiq Fontaine 04/03/2020 11:03 AM

## 2020-04-04 LAB — HEMOGLOBIN A1C
Hgb A1c MFr Bld: 5.4 % (ref 4.8–5.6)
Mean Plasma Glucose: 108 mg/dL

## 2020-04-04 MED ORDER — HALOPERIDOL 5 MG PO TABS
15.0000 mg | ORAL_TABLET | Freq: Every day | ORAL | Status: DC
  Administered 2020-04-04 – 2020-04-12 (×9): 15 mg via ORAL
  Filled 2020-04-04 (×9): qty 3

## 2020-04-04 NOTE — [facility] Group Notes (Signed)
LCSW Group Therapy Note  04/04/2020 2:34 PM  Type of Therapy and Topic:  Group Therapy:  Feelings around Relapse and Recovery  Participation Level:  Did Not Attend   Description of Group:    Patients in this group will discuss emotions they experience before and after a relapse. They will process how experiencing these feelings, or avoidance of experiencing them, relates to having a relapse. Facilitator will guide patients to explore emotions they have related to recovery. Patients will be encouraged to process which emotions are more powerful. They will be guided to discuss the emotional reaction significant others in their lives may have to their relapse or recovery. Patients will be assisted in exploring ways to respond to the emotions of others without this contributing to a relapse.  Therapeutic Goals: 1. Patient will identify two or more emotions that lead to a relapse for them 2. Patient will identify two emotions that result when they relapse 3. Patient will identify two emotions related to recovery 4. Patient will demonstrate ability to communicate their needs through discussion and/or role plays   Summary of Patient Progress: X  Therapeutic Modalities:   Cognitive Behavioral Therapy Solution-Focused Therapy Assertiveness Training Relapse Prevention Therapy   Penni Homans, MSW, LCSW 04/04/2020 2:34 PM

## 2020-04-04 NOTE — Progress Notes (Signed)
Recreation Therapy Notes  Date: 04/04/2020  Time: 9:30 am  Location: Craft room   Behavioral response: Appropriate  Intervention Topic: Happiness   Discussion/Intervention:  Group content today was focused on Happiness. The group defined happiness and described where happiness comes from. Individuals identified what makes them happy and how they go about making others happy. Patients expressed things that stop them from being happy and ways they can improve their happiness. The group stated reasons why it is important to be happy. The group participated in the intervention "My Happiness", where they had a chance to identify and express things that make them happy. Clinical Observations/Feedback:  Patient came to group and stated that happiness keeps people alive. She explained that work can stop people from being happy sometimes. Individual was actively social with peers and staff while participating in the intervention.  Zyree Traynham LRT/CTRS         Yesmin Mutch 04/04/2020 12:48 PM

## 2020-04-04 NOTE — Progress Notes (Signed)
Patient was pleasant during assessment denying SI/HI/AVH. Patient presets in manic state, hyper verbal. Patient compliant with medication administration per MD orders. Patient denies anxiety, depression and pain with this Clinical research associate. Patient given education, support and encouragement to be active in her treatment plan. Patient observed interacting appropriately with staff and peers. Patient being monitored Q 15 minutes for safety. Pt remains safe on the unit.

## 2020-04-04 NOTE — Progress Notes (Signed)
[facility] MD Progress Note  04/04/2020 5:49 PM Kristiane Morsch  MRN:  [id number] Subjective: Follow-up note for 59 year old woman with psychotic disorder.  Patient was intrusive today asking to be discharged.  Claiming to be completely cured.  In her conversation she is still obviously paranoid and nervous about everything.  Tried to convince me that her husband wanted her discharged when he made it clear to me the last 2 days that he wanted no such thing.  Patient is still complaining about medication although she does not show obvious side effects.  She asked if she could switch all of her Haldol to nighttime and I said I was willing to give that a try.  Encouraged her to continue with groups and interacting with others. Principal Problem: Bipolar affective disorder, current episode manic with psychotic symptoms (HCC) Diagnosis: Principal Problem:   Bipolar affective disorder, current episode manic with psychotic symptoms (HCC) Active Problems:   Acute psychosis (HCC)  Total Time spent with patient: 30 minutes  Past Psychiatric History: Longstanding history of mental illness.  Recent suicide attempt related to paranoia  Past Medical History:  Past Medical History:  Diagnosis Date  . Allergic rhinitis   . Anxiety   . Depression   . Schizophrenia [facility])     Past Surgical History:  Procedure Laterality Date  . TONSILLECTOMY     Family History:  Family History  Problem Relation Age of Onset  . Hypertension Mother   . Impulse control disorder Mother   . CVA Father   . Depression Father    Family Psychiatric  History: See previous Social History:  Social History   Substance and Sexual Activity  Alcohol Use Yes  . Alcohol/week: 1.0 standard drink  . Types: 1 Glasses of wine per week   Comment: very little     Social History   Substance and Sexual Activity  Drug Use No    Social History   Socioeconomic History  . Marital status: Married    Spouse name: [name]  . Number of  children: 2  . Years of education: 16  . Highest education level: Bachelor's degree (e.g., BA, AB, BS)  Occupational History  . Occupation: [employer]  Tobacco Use  . Smoking status: Former Smoker    Packs/day: 1.00    Years: 1.00    Pack years: 1.00  . Smokeless tobacco: Never Used  Vaping Use  . Vaping Use: Never used  Substance and Sexual Activity  . Alcohol use: Yes    Alcohol/week: 1.0 standard drink    Types: 1 Glasses of wine per week    Comment: very little  . Drug use: No  . Sexual activity: Yes    Partners: Male  Other Topics Concern  . Not on file  Social History Narrative   Patient lives with her husband here in [place], his name is [name].    Patient works as a Holiday representative at [employer].    Patient is recently from [place].       Hopeful to get back to exercising once the weather cools down.    She was participating in [employer], however the pandemic has made this difficult for her.    Social Determinants of Health   Financial Resource Strain: Low Risk   . Difficulty of Paying Living Expenses: Not hard at all  Food Insecurity: Food Insecurity Present  . Worried About Programme researcher, broadcasting/film/video in the Last Year: Sometimes true  . Ran Out of Food in  the Last Year: Sometimes true  Transportation Needs: No Transportation Needs  . Lack of Transportation (Medical): No  . Lack of Transportation (Non-Medical): No  Physical Activity: Insufficiently Active  . Days of Exercise per Week: 7 days  . Minutes of Exercise per Session: 10 min  Stress: Stress Concern Present  . Feeling of Stress : To some extent  Social Connections: Moderately Isolated  . Frequency of Communication with Friends and Family: Once a week  . Frequency of Social Gatherings with Friends and Family: Never  . Attends Religious Services: More than 4 times per year  . Active Member of Clubs or Organizations: No  . Attends Banker Meetings: Never  . Marital Status: Married   Additional  Social History:                         Sleep: Fair  Appetite:  Fair  Current Medications: Current Facility-Administered Medications  Medication Dose Route Frequency Provider Last Rate Last Admin  . acetaminophen (TYLENOL) tablet 650 mg  650 mg Oral Q6H PRN Jashun Puertas T, MD      . alum & mag hydroxide-simeth (MAALOX/MYLANTA) 200-200-20 MG/5ML suspension 30 mL  30 mL Oral Q4H PRN Colin Ellers T, MD      . benztropine (COGENTIN) tablet 1 mg  1 mg Oral BID Kaylah Chiasson T, MD   1 mg at 04/04/20 1738  . haloperidol (HALDOL) tablet 10 mg  10 mg Oral QHS Waverly Chavarria T, MD   10 mg at 04/03/20 2109  . haloperidol (HALDOL) tablet 5 mg  5 mg Oral QAC breakfast Alieah Brinton, Jackquline Denmark, MD   5 mg at 04/04/20 0905  . lamoTRIgine (LAMICTAL) tablet 100 mg  100 mg Oral QHS Ashlie Mcmenamy T, MD   100 mg at 04/03/20 2110  . magnesium hydroxide (MILK OF MAGNESIA) suspension 30 mL  30 mL Oral Daily PRN Autumm Hattery, Jackquline Denmark, MD        Lab Results:  Results for orders placed or performed during the hospital encounter of 04/01/20 (from the past 48 hour(s))  Hemoglobin A1c     Status: None   Collection Time: 04/03/20  7:46 AM  Result Value Ref Range   Hgb A1c MFr Bld 5.4 4.8 - 5.6 %    Comment: (NOTE)         Prediabetes: 5.7 - 6.4         Diabetes: >6.4         Glycemic control for adults with diabetes: <7.0    Mean Plasma Glucose 108 mg/dL    Comment: (NOTE) Performed At: [facility] [address] [place], [place] [id number] Jolene Schimke MD WU:[id number]   Lipid panel     Status: Abnormal   Collection Time: 04/03/20  7:46 AM  Result Value Ref Range   Cholesterol 206 (H) 0 - 200 mg/dL   Triglycerides 91 <956 mg/dL   HDL 62 >21 mg/dL   Total CHOL/HDL Ratio 3.3 RATIO   VLDL 18 0 - 40 mg/dL   LDL Cholesterol 308 (H) 0 - 99 mg/dL    Comment:        Total Cholesterol/HDL:CHD Risk Coronary Heart Disease Risk Table                     Men   Women  1/2 Average Risk   3.4   3.3   Average Risk       5.0  4.4  2 X Average Risk   9.6   7.1  3 X Average Risk  23.4   11.0        Use the calculated Patient Ratio above and the CHD Risk Table to determine the patient's CHD Risk.        ATP III CLASSIFICATION (LDL):  <100     mg/dL   Optimal  [id number]  mg/dL   Near or Above                    Optimal  130-159  mg/dL   Borderline  [id number]  mg/dL   High  >956>190     mg/dL   Very High Performed at [facility], [address] [place], [place] [id number]     Blood Alcohol level:  Lab Results  Component Value Date   [facility]    Metabolic Disorder Labs: Lab Results  Component Value Date   HGBA1C 5.4 04/03/2020   MPG 108 04/03/2020   No results found for: PROLACTIN Lab Results  Component Value Date   CHOL 206 (H) 04/03/2020   TRIG 91 04/03/2020   HDL 62 04/03/2020   CHOLHDL 3.3 04/03/2020   VLDL 18 04/03/2020   LDLCALC 126 (H) 04/03/2020   LDLCALC 107 (H) 03/26/2020    Physical Findings: AIMS:  , ,  ,  ,    CIWA:    COWS:     Musculoskeletal: Strength & Muscle Tone: within normal limits Gait & Station: normal Patient leans: N/A  Psychiatric Specialty Exam: Physical Exam Vitals and nursing note reviewed.  Constitutional:      Appearance: She is well-developed.  HENT:     Head: Normocephalic and atraumatic.  Eyes:     Conjunctiva/sclera: Conjunctivae normal.     Pupils: Pupils are equal, round, and reactive to light.  Cardiovascular:     Heart sounds: Normal heart sounds.  Pulmonary:     Effort: Pulmonary effort is normal.  Abdominal:     Palpations: Abdomen is soft.  Musculoskeletal:        General: Normal range of motion.     Cervical back: Normal range of motion.  Skin:    General: Skin is warm and dry.  Neurological:     General: No focal deficit present.     Mental Status: She is alert.  Psychiatric:        Attention and Perception: She is inattentive.        Mood and Affect: Mood is anxious. Affect is  inappropriate.        Speech: Speech is rapid and pressured.        Behavior: Behavior is agitated. Behavior is not aggressive.        Thought Content: Thought content is paranoid and delusional.        Cognition and Memory: Cognition is impaired.        Judgment: Judgment is inappropriate.     Review of Systems  Constitutional: Negative.   HENT: Negative.   Eyes: Negative.   Respiratory: Negative.   Cardiovascular: Negative.   Gastrointestinal: Negative.   Musculoskeletal: Negative.   Skin: Negative.   Neurological: Negative.   Psychiatric/Behavioral: Positive for dysphoric mood and hallucinations. The patient is nervous/anxious.     Blood pressure 134/76, pulse 98, temperature 98.7 F (37.1 C), temperature source Oral, resp. rate 17, height 5\' 3"  (1.6 m), weight 66.7 kg, SpO2 100 %.Body mass index is 26.04 kg/m.  General Appearance: Casual  Eye Contact:  Good  Speech:  Pressured  Volume:  Increased  Mood:  Anxious  Affect:  Inappropriate and Labile  Thought Process:  Disorganized  Orientation:  Full (Time, Place, and Person)  Thought Content:  Illogical, Delusions, Paranoid Ideation and Tangential  Suicidal Thoughts:  No  Homicidal Thoughts:  No  Memory:  Immediate;   Fair Recent;   Poor Remote;   Poor  Judgement:  Impaired  Insight:  Shallow  Psychomotor Activity:  Decreased  Concentration:  Concentration: Poor  Recall:  Fair  Fund of Knowledge:  Fair  Language:  Fair  Akathisia:  No  Handed:  Right  AIMS (if indicated):     Assets:  Desire for Improvement Financial Resources/Insurance Housing Physical Health Resilience Social Support  ADL's:  Impaired  Cognition:  Impaired,  Mild  Sleep:  Number of Hours: 4.5     Treatment Plan Summary: Daily contact with patient to assess and evaluate symptoms and progress in treatment, Medication management and Plan Educational counseling.  Reviewed with the patient the need for hospitalization until her paranoia is  improved.  Tried to get her to be more insightful about her symptoms.  Urged patient to be compliant with full doses of medicine.  We can try switching the Haldol to all at night.  Ultimately I am hoping to get her onto a long-acting injectable.  Mordecai Rasmussen, MD 04/04/2020, 5:49 PM

## 2020-04-04 NOTE — Plan of Care (Signed)
Patient complaint with medication administration per MD orders  Problem: Medication: Goal: Compliance with prescribed medication regimen will improve Outcome: Progressing

## 2020-04-04 NOTE — Plan of Care (Signed)
D- Patient alert and oriented. Patient presented in an anxious, preoccupied mood on assessment stating that she slept good last night and had no complaints to voice to this Clinical research associate. Patient's speech is very rapid/pressured and tangential in her thought processes. Patient denied depression, however, she endorsed anxiety, reporting "because my husband is anxious, having bad dreams and he's worried about me coming home". Patient also denied SI, HI, AVH, and pain at this time. Patient had no stated goals for today.   A- Scheduled medications administered to patient, per MD orders. Support and encouragement provided.  Routine safety checks conducted every 15 minutes.  Patient informed to notify staff with problems or concerns.  R- No adverse drug reactions noted. Patient contracts for safety at this time. Patient compliant with medications and treatment plan. Patient receptive, calm, and cooperative. Patient interacts well with others on the unit.  Patient remains safe at this time.  Problem: Education: Goal: Ability to make informed decisions regarding treatment will improve Outcome: Not Progressing   Problem: Coping: Goal: Coping ability will improve Outcome: Not Progressing   Problem: Health Behavior/Discharge Planning: Goal: Identification of resources available to assist in meeting health care needs will improve Outcome: Not Progressing   Problem: Medication: Goal: Compliance with prescribed medication regimen will improve Outcome: Not Progressing   Problem: Self-Concept: Goal: Ability to disclose and discuss suicidal ideas will improve Outcome: Not Progressing Goal: Will verbalize positive feelings about self Outcome: Not Progressing   Problem: Education: Goal: Knowledge of [place] [facility] Education information/materials will improve Outcome: Not Progressing Goal: Emotional status will improve Outcome: Not Progressing Goal: Mental status will improve Outcome: Not  Progressing Goal: Verbalization of understanding the information provided will improve Outcome: Not Progressing   Problem: Activity: Goal: Interest or engagement in activities will improve Outcome: Not Progressing Goal: Sleeping patterns will improve Outcome: Not Progressing   Problem: Coping: Goal: Ability to verbalize frustrations and anger appropriately will improve Outcome: Not Progressing Goal: Ability to demonstrate self-control will improve Outcome: Not Progressing   Problem: Health Behavior/Discharge Planning: Goal: Identification of resources available to assist in meeting health care needs will improve Outcome: Not Progressing Goal: Compliance with treatment plan for underlying cause of condition will improve Outcome: Not Progressing   Problem: Physical Regulation: Goal: Ability to maintain clinical measurements within normal limits will improve Outcome: Not Progressing   Problem: Safety: Goal: Periods of time without injury will increase Outcome: Not Progressing

## 2020-04-05 NOTE — Progress Notes (Signed)
Patient was pleasant during assessment denying SI/HI/AVH. Patient presets with a better affect this evening compared to last night. Patient compliant with medication administration per MD orders. Patient denies anxiety, depression and pain with this writer. Patient given education, support and encouragement to be active in her treatment plan. Patient observed interacting appropriately with staff and peers. Patient being monitored Q 15 minutes for safety. Pt remains safe on the unit. 

## 2020-04-05 NOTE — [facility] Group Notes (Signed)
LCSW Group Therapy Note  04/05/2020   1:15 pm-2:15 pm  Type of Therapy and Topic:  Group Therapy: Anger Cues and Responses  Participation Level:  Active   Description of Group:   In this group, patients learned how to recognize the physical, cognitive, emotional, and behavioral responses they have to anger-provoking situations.  They identified a recent time they became angry and how they reacted.  They analyzed how their reaction was possibly beneficial and how it was possibly unhelpful.  The group discussed a variety of healthier coping skills that could help with such a situation in the future.  Focus was placed on how helpful it is to recognize the underlying emotions to our anger, because working on those can lead to a more permanent solution as well as our ability to focus on the important rather than the urgent.  Therapeutic Goals: 1. Patients will remember their last incident of anger and how they felt emotionally and physically, what their thoughts were at the time, and how they behaved. 2. Patients will identify how their behavior at that time worked for them, as well as how it worked against them. 3. Patients will explore possible new behaviors to use in future anger situations. 4. Patients will learn that anger itself is normal and cannot be eliminated, and that healthier reactions can assist with resolving conflict rather than worsening situations.  Summary of Patient Progress:  The patient shared that her most recent time of anger was when is expecting to do all the family cooking and said her response has been to internalize her anger without ever resolving it. She expresses willingness to acquire more anger management skills. The patient expressed feeling watched and recorded on video. It is unclear if this is actually happening or it is the patient's paranoia being expressed.  Therapeutic Modalities:   Cognitive Behavioral Therapy  Evorn Gong

## 2020-04-05 NOTE — Plan of Care (Signed)
D: Patient denies SI / HI / AVH. Patient reports anxiety and paranoia. Patient refuses medication this morning. Will continue to reinforce medication adherence. Patient is witnessed in dayroom interacting with peers. Patient is frequently demanding of staff and is at nurses station frequently.   Action:  Q x 15 minute observation checks were completed for safety. Patient was provided with education on medications. Patient was offered support and encouragement. Patient was given scheduled medications. Patient  was encourage to attend groups, participate in unit activities and continue with plan of care.     Response: Patient safety maintained on unit.    Problem: Education: Goal: Ability to make informed decisions regarding treatment will improve Outcome: Not Progressing   Problem: Coping: Goal: Coping ability will improve Outcome: Not Progressing   Problem: Health Behavior/Discharge Planning: Goal: Identification of resources available to assist in meeting health care needs will improve Outcome: Not Progressing   Problem: Medication: Goal: Compliance with prescribed medication regimen will improve Outcome: Not Progressing   Problem: Self-Concept: Goal: Ability to disclose and discuss suicidal ideas will improve Outcome: Not Progressing Goal: Will verbalize positive feelings about self Outcome: Not Progressing

## 2020-04-05 NOTE — Progress Notes (Signed)
Patient was pleasant during assessment denying SI/HI/AVH. Patient presets in manic state, hyper verbal. Patient compliant with medication administration per MD orders. Patient denies anxiety, depression and pain with this writer. Patient given education, support and encouragement to be active in her treatment plan. Patient observed interacting appropriately with staff and peers. Patient being monitored Q 15 minutes for safety. Pt remains safe on the unit.  

## 2020-04-05 NOTE — Plan of Care (Signed)
Patient compliant with medication administration per MD orders  Problem: Medication: Goal: Compliance with prescribed medication regimen will improve Outcome: Progressing   

## 2020-04-05 NOTE — Plan of Care (Signed)
Patient complaint with medication administration per MD orders  Problem: Medication: Goal: Compliance with prescribed medication regimen will improve Outcome: Progressing   

## 2020-04-05 NOTE — Progress Notes (Signed)
[facility] MD Progress Note  04/05/2020 11:24 AM Tanya Simpson  MRN:  [id number] Subjective:   I want to go home soon  Principal Problem: Bipolar affective disorder, current episode manic with psychotic symptoms (HCC) Diagnosis: Principal Problem:   Bipolar affective disorder, current episode manic with psychotic symptoms (HCC) Active Problems:   Acute psychosis (HCC)  Total Time spent with patient: 20 25  Past Psychiatric History:  Already written  Past Medical History:  Past Medical History:  Diagnosis Date  . Allergic rhinitis   . Anxiety   . Depression   . Schizophrenia [facility])     Past Surgical History:  Procedure Laterality Date  . TONSILLECTOMY     Family History:  Family History  Problem Relation Age of Onset  . Hypertension Mother   . Impulse control disorder Mother   . CVA Father   . Depression Father    Family Psychiatric  History: already written Social History:  Social History   Substance and Sexual Activity  Alcohol Use Yes  . Alcohol/week: 1.0 standard drink  . Types: 1 Glasses of wine per week   Comment: very little     Social History   Substance and Sexual Activity  Drug Use No    Social History   Socioeconomic History  . Marital status: Married    Spouse name: [name]  . Number of children: 2  . Years of education: 16  . Highest education level: Bachelor's degree (e.g., BA, AB, BS)  Occupational History  . Occupation: [employer]  Tobacco Use  . Smoking status: Former Smoker    Packs/day: 1.00    Years: 1.00    Pack years: 1.00  . Smokeless tobacco: Never Used  Vaping Use  . Vaping Use: Never used  Substance and Sexual Activity  . Alcohol use: Yes    Alcohol/week: 1.0 standard drink    Types: 1 Glasses of wine per week    Comment: very little  . Drug use: No  . Sexual activity: Yes    Partners: Male  Other Topics Concern  . Not on file  Social History Narrative   Patient lives with her husband here in [place], his name is [name].     Patient works as a Holiday representative at [employer].    Patient is recently from [place].       Hopeful to get back to exercising once the weather cools down.    She was participating in [employer], however the pandemic has made this difficult for her.    Social Determinants of Health   Financial Resource Strain: Low Risk   . Difficulty of Paying Living Expenses: Not hard at all  Food Insecurity: Food Insecurity Present  . Worried About Programme researcher, broadcasting/film/video in the Last Year: Sometimes true  . Ran Out of Food in the Last Year: Sometimes true  Transportation Needs: No Transportation Needs  . Lack of Transportation (Medical): No  . Lack of Transportation (Non-Medical): No  Physical Activity: Insufficiently Active  . Days of Exercise per Week: 7 days  . Minutes of Exercise per Session: 10 min  Stress: Stress Concern Present  . Feeling of Stress : To some extent  Social Connections: Moderately Isolated  . Frequency of Communication with Friends and Family: Once a week  . Frequency of Social Gatherings with Friends and Family: Never  . Attends Religious Services: More than 4 times per year  . Active Member of Clubs or Organizations: No  . Attends Club  or Organization Meetings: Never  . Marital Status: Married   Additional Social History:       She continues on unit pending stablization over the weekend and possible discharge into next week.  Somewhat cooperative, not much insight today, feels bored she says                   Sleep: fair   Appetite:  Fair   Current Medications: Current Facility-Administered Medications  Medication Dose Route Frequency Provider Last Rate Last Admin  . acetaminophen (TYLENOL) tablet 650 mg  650 mg Oral Q6H PRN Clapacs, John T, MD      . alum & mag hydroxide-simeth (MAALOX/MYLANTA) 200-200-20 MG/5ML suspension 30 mL  30 mL Oral Q4H PRN Clapacs, John T, MD      . benztropine (COGENTIN) tablet 1 mg  1 mg Oral BID Clapacs, John T, MD   1 mg at  04/04/20 1738  . haloperidol (HALDOL) tablet 15 mg  15 mg Oral QHS Clapacs, Jackquline Denmark, MD   15 mg at 04/04/20 2136  . lamoTRIgine (LAMICTAL) tablet 100 mg  100 mg Oral QHS Clapacs, Jackquline Denmark, MD   100 mg at 04/04/20 2136  . magnesium hydroxide (MILK OF MAGNESIA) suspension 30 mL  30 mL Oral Daily PRN Clapacs, Jackquline Denmark, MD        Lab Results: No results found for this or any previous visit (from the past 48 hour(s)).  Blood Alcohol level:  Lab Results  Component Value Date   ETH <10 03/28/2020    Metabolic Disorder Labs: Lab Results  Component Value Date   HGBA1C 5.4 04/03/2020   MPG 108 04/03/2020   No results found for: PROLACTIN Lab Results  Component Value Date   CHOL 206 (H) 04/03/2020   TRIG 91 04/03/2020   HDL 62 04/03/2020   CHOLHDL 3.3 04/03/2020   VLDL 18 04/03/2020   LDLCALC 126 (H) 04/03/2020   LDLCALC 107 (H) 03/26/2020    Physical Findings: AIMS:  , ,  ,  ,    CIWA:    COWS:     Musculoskeletal: Strength & Muscle Tone:  No new change Gait & Station: no new change  Patient leans:  No new change   Psychiatric Specialty Exam: Physical Exam  Review of Systems  Blood pressure 120/71, pulse 96, temperature 97.7 F (36.5 C), temperature source Oral, resp. rate 17, height 5\' 3"  (1.6 m), weight 66.7 kg, SpO2 99 %.Body mass index is 26.04 kg/m.    Mental Status     Alert cooperative oriented to person place date and time Consciousness not clouded or fluctuatn Speech normal Concentration and attention normal Mood depressed  Affect somewhat constricted Thought process and content --improving with psychosis Less anxious and up and down Judgment insight reliability fair SI and HI contracts for safety Fund of knowledge intelligence normal No shakes tics or movement problems Memory --remote recent immediate intact through general quesitons Abstraction normal                                                   Treatment Plan  Summary: Continues with current medications and structure over the weekend   , MD 04/05/2020, 11:24 AM

## 2020-04-06 NOTE — [facility] Group Notes (Signed)
[facility] LCSW Group Therapy Note  Date/Time:  04/06/2020 1:10 PM-1:57 PM  Type of Therapy and Topic:  Group Therapy:  Healthy and Unhealthy Supports  Participation Level:  Active   Description of Group:  Patients in this group were introduced to the idea of adding a variety of healthy supports to address the various needs in their lives.Patients discussed what additional healthy supports could be helpful in their recovery and wellness after discharge in order to prevent future hospitalizations.   An emphasis was placed on using counselor, doctor, therapy groups, 12-step groups, and problem-specific support groups to expand supports.  They also worked as a group on developing a specific plan for several patients to deal with unhealthy supports through boundary-setting, psychoeducation with loved ones, and even termination of relationships.   Therapeutic Goals:   1)  discuss importance of adding supports to stay well once out of the hospital  2)  compare healthy versus unhealthy supports and identify some examples of each  3)  generate ideas and descriptions of healthy supports that can be added  4)  offer mutual support about how to address unhealthy supports  5)  encourage active participation in and adherence to discharge plan    Summary of Patient Progress: Patient checked into group by stating what temperature she was at between 1-10. Patient stated she is a 5 because she has been feeling better recently. The patient stated that current healthy supports in her life are her friends and religion while current unhealthy supports include her husband who is not consistent. The patient expressed a willingness to add her old college friend and finding a counselor due to being new to the area as supports to help in her recovery journey.    Therapeutic Modalities:   Motivational Interviewing Brief Solution-Focused Therapy  Susa Simmonds, Theresia Majors 04/06/2020  2:39 PM

## 2020-04-06 NOTE — Progress Notes (Signed)
[facility] MD Progress Note  04/06/2020 11:31 AM Tanya Simpson  MRN:  [id number] Subjective:  She wants to go home soon  Principal Problem: Bipolar affective disorder, current episode manic with psychotic symptoms (HCC) Diagnosis: Principal Problem:   Bipolar affective disorder, current episode manic with psychotic symptoms (HCC) Active Problems:   Acute psychosis (HCC)  Total Time spent with patient: 15  Past Psychiatric History:   Past Medical History:  Past Medical History:  Diagnosis Date  . Allergic rhinitis   . Anxiety   . Depression   . Schizophrenia [facility])     Past Surgical History:  Procedure Laterality Date  . TONSILLECTOMY     Family History:  Family History  Problem Relation Age of Onset  . Hypertension Mother   . Impulse control disorder Mother   . CVA Father   . Depression Father    Family Psychiatric  History:  Social History:  Social History   Substance and Sexual Activity  Alcohol Use Yes  . Alcohol/week: 1.0 standard drink  . Types: 1 Glasses of wine per week   Comment: very little     Social History   Substance and Sexual Activity  Drug Use No    Social History   Socioeconomic History  . Marital status: Married    Spouse name: [name]  . Number of children: 2  . Years of education: 16  . Highest education level: Bachelor's degree (e.g., BA, AB, BS)  Occupational History  . Occupation: [employer]  Tobacco Use  . Smoking status: Former Smoker    Packs/day: 1.00    Years: 1.00    Pack years: 1.00  . Smokeless tobacco: Never Used  Vaping Use  . Vaping Use: Never used  Substance and Sexual Activity  . Alcohol use: Yes    Alcohol/week: 1.0 standard drink    Types: 1 Glasses of wine per week    Comment: very little  . Drug use: No  . Sexual activity: Yes    Partners: Male  Other Topics Concern  . Not on file  Social History Narrative   Patient lives with her husband here in [place], his name is [name].    Patient works as a Holiday representative at  [employer].    Patient is recently from [place].       Hopeful to get back to exercising once the weather cools down.    She was participating in [employer], however the pandemic has made this difficult for her.    Social Determinants of Health   Financial Resource Strain: Low Risk   . Difficulty of Paying Living Expenses: Not hard at all  Food Insecurity: Food Insecurity Present  . Worried About Programme researcher, broadcasting/film/video in the Last Year: Sometimes true  . Ran Out of Food in the Last Year: Sometimes true  Transportation Needs: No Transportation Needs  . Lack of Transportation (Medical): No  . Lack of Transportation (Non-Medical): No  Physical Activity: Insufficiently Active  . Days of Exercise per Week: 7 days  . Minutes of Exercise per Session: 10 min  Stress: Stress Concern Present  . Feeling of Stress : To some extent  Social Connections: Moderately Isolated  . Frequency of Communication with Friends and Family: Once a week  . Frequency of Social Gatherings with Friends and Family: Never  . Attends Religious Services: More than 4 times per year  . Active Member of Clubs or Organizations: No  . Attends Banker Meetings: Never  .  Marital Status: Married   Additional Social History:     She wants to go home soon and go to possibly day treatment she says she needs marital counseling.  She will discuss with CW before discharge  Her meds are okay she says without side effects,  She says she does not need AD ] meds at this time   Continues on unit pending discharge this week with safety plan                     Sleep:  On and off   Appetite:  Better now   Current Medications: Current Facility-Administered Medications  Medication Dose Route Frequency Provider Last Rate Last Admin  . acetaminophen (TYLENOL) tablet 650 mg  650 mg Oral Q6H PRN Clapacs, Jackquline Denmark, MD   650 mg at 04/05/20 1826  . alum & mag hydroxide-simeth (MAALOX/MYLANTA) 200-200-20 MG/5ML  suspension 30 mL  30 mL Oral Q4H PRN Clapacs, John T, MD      . benztropine (COGENTIN) tablet 1 mg  1 mg Oral BID Clapacs, Jackquline Denmark, MD   1 mg at 04/06/20 0857  . haloperidol (HALDOL) tablet 15 mg  15 mg Oral QHS Clapacs, Jackquline Denmark, MD   15 mg at 04/05/20 2109  . lamoTRIgine (LAMICTAL) tablet 100 mg  100 mg Oral QHS Clapacs, John T, MD   100 mg at 04/05/20 2109  . magnesium hydroxide (MILK OF MAGNESIA) suspension 30 mL  30 mL Oral Daily PRN Clapacs, Jackquline Denmark, MD        Lab Results: No results found for this or any previous visit (from the past 48 hour(s)).  Blood Alcohol level:  Lab Results  Component Value Date   ETH <10 03/28/2020    Metabolic Disorder Labs: Lab Results  Component Value Date   HGBA1C 5.4 04/03/2020   MPG 108 04/03/2020   No results found for: PROLACTIN Lab Results  Component Value Date   CHOL 206 (H) 04/03/2020   TRIG 91 04/03/2020   HDL 62 04/03/2020   CHOLHDL 3.3 04/03/2020   VLDL 18 04/03/2020   LDLCALC 126 (H) 04/03/2020   LDLCALC 107 (H) 03/26/2020    Physical Findings: AIMS:  , ,  ,  ,    CIWA:    COWS:     Musculoskeletal: Strength & Muscle Tone: same  Gait & Station: normal  Patient leans:   Psychiatric Specialty Exam: Physical Exam  Review of Systems  Blood pressure (!) 142/86, pulse 100, temperature 98.4 F (36.9 C), temperature source Oral, resp. rate 17, height 5\' 3"  (1.6 m), weight 66.7 kg, SpO2 100 %.Body mass index is 26.04 kg/m.    Brief Mental Status  Alert cooperative oriented to person place date and time  Not clouded or fluctuant Speech normal  No movement problems No new psychosis or mania Somewhat anxious Contracts for safety  Concentration and attention normal for now More engaging in general                                                 Sleep:  Number of Hours: 4     Treatment Plan Summary:  Awaits weekday plan and is willing to do day treatment or IOP she says in [facility], MD 04/06/2020, 11:31 AM

## 2020-04-06 NOTE — Plan of Care (Signed)
D- Patient alert and oriented. Patient presented in a preoccupied mood on assessment stating that she didn't sleep as good as the other night, but did not have any complaints to voice to this Clinical research associate. Patient denied SI, HI, AVH, and pain at this time. When patient was asked about completing her self-inventory, she stated "no, I'm good, nothing's changed, I feel a little better. I'm ready to see the doctor so I can get out of here". Patient did not report any signs/symptoms of depression/anxiety. Patient had no stated goals for today, however, she did attend/participate in social work group without any issues or concerns.  A- Scheduled medications administered to patient, per MD orders. Support and encouragement provided.  Routine safety checks conducted every 15 minutes.  Patient informed to notify staff with problems or concerns.  R- No adverse drug reactions noted. Patient contracts for safety at this time. Patient compliant with medications and treatment plan. Patient receptive, calm, and cooperative. Patient interacts well with others on the unit.  Patient remains safe at this time.  Problem: Education: Goal: Ability to make informed decisions regarding treatment will improve Outcome: Not Progressing   Problem: Coping: Goal: Coping ability will improve Outcome: Not Progressing   Problem: Health Behavior/Discharge Planning: Goal: Identification of resources available to assist in meeting health care needs will improve Outcome: Not Progressing   Problem: Medication: Goal: Compliance with prescribed medication regimen will improve Outcome: Not Progressing   Problem: Self-Concept: Goal: Ability to disclose and discuss suicidal ideas will improve Outcome: Not Progressing Goal: Will verbalize positive feelings about self Outcome: Not Progressing   Problem: Education: Goal: Knowledge of [facility] Education information/materials will improve Outcome: Not Progressing Goal:  Emotional status will improve Outcome: Not Progressing Goal: Mental status will improve Outcome: Not Progressing Goal: Verbalization of understanding the information provided will improve Outcome: Not Progressing   Problem: Activity: Goal: Interest or engagement in activities will improve Outcome: Not Progressing Goal: Sleeping patterns will improve Outcome: Not Progressing   Problem: Coping: Goal: Ability to verbalize frustrations and anger appropriately will improve Outcome: Not Progressing Goal: Ability to demonstrate self-control will improve Outcome: Not Progressing   Problem: Health Behavior/Discharge Planning: Goal: Identification of resources available to assist in meeting health care needs will improve Outcome: Not Progressing Goal: Compliance with treatment plan for underlying cause of condition will improve Outcome: Not Progressing   Problem: Physical Regulation: Goal: Ability to maintain clinical measurements within normal limits will improve Outcome: Not Progressing   Problem: Safety: Goal: Periods of time without injury will increase Outcome: Not Progressing

## 2020-04-07 NOTE — [facility] Group Notes (Signed)
LCSW Group Therapy Note   04/07/2020 1:50 PM  Type of Therapy and Topic:  Group Therapy:  Overcoming Obstacles   Participation Level:  Did Not Attend   Description of Group:    In this group patients will be encouraged to explore what they see as obstacles to their own wellness and recovery. They will be guided to discuss their thoughts, feelings, and behaviors related to these obstacles. The group will process together ways to cope with barriers, with attention given to specific choices patients can make. Each patient will be challenged to identify changes they are motivated to make in order to overcome their obstacles. This group will be process-oriented, with patients participating in exploration of their own experiences as well as giving and receiving support and challenge from other group members.   Therapeutic Goals: 1. Patient will identify personal and current obstacles as they relate to admission. 2. Patient will identify barriers that currently interfere with their wellness or overcoming obstacles.  3. Patient will identify feelings, thought process and behaviors related to these barriers. 4. Patient will identify two changes they are willing to make to overcome these obstacles:      Summary of Patient Progress Patient became upset when asked to wear a mask and left group.   Therapeutic Modalities:   Cognitive Behavioral Therapy Solution Focused Therapy Motivational Interviewing Relapse Prevention Therapy  Penni Homans, MSW, LCSW 04/07/2020 1:50 PM

## 2020-04-07 NOTE — Progress Notes (Signed)
[facility] MD Progress Note  04/07/2020 11:28 AM Tanya Simpson  MRN:  [id number] Subjective: Patient seen chart reviewed.  Patient once again starts off by asking for discharge.  Quickly however and really without any provocation, she launches into paranoid thinking about her situation here and at home.  Continues to ramble in a disorganized manner talking about guilt for things that happened years ago many of which do not even make any sense.  Has been at least semicompliant with medicine.  No signs of EPS no other side effects from medicine. Principal Problem: Bipolar affective disorder, current episode manic with psychotic symptoms (HCC) Diagnosis: Principal Problem:   Bipolar affective disorder, current episode manic with psychotic symptoms (HCC) Active Problems:   Acute psychosis (HCC)  Total Time spent with patient: 30 minutes  Past Psychiatric History: Past history of bipolar disorder with several severe suicide attempts and hospitalizations  Past Medical History:  Past Medical History:  Diagnosis Date  . Allergic rhinitis   . Anxiety   . Depression   . Schizophrenia [facility])     Past Surgical History:  Procedure Laterality Date  . TONSILLECTOMY     Family History:  Family History  Problem Relation Age of Onset  . Hypertension Mother   . Impulse control disorder Mother   . CVA Father   . Depression Father    Family Psychiatric  History: See previous Social History:  Social History   Substance and Sexual Activity  Alcohol Use Yes  . Alcohol/week: 1.0 standard drink  . Types: 1 Glasses of wine per week   Comment: very little     Social History   Substance and Sexual Activity  Drug Use No    Social History   Socioeconomic History  . Marital status: Married    Spouse name: Tanya Simpson  . Number of children: 2  . Years of education: 16  . Highest education level: Bachelor's degree (e.g., BA, AB, BS)  Occupational History  . Occupation: [employer]  Tobacco Use  . Smoking  status: Former Smoker    Packs/day: 1.00    Years: 1.00    Pack years: 1.00  . Smokeless tobacco: Never Used  Vaping Use  . Vaping Use: Never used  Substance and Sexual Activity  . Alcohol use: Yes    Alcohol/week: 1.0 standard drink    Types: 1 Glasses of wine per week    Comment: very little  . Drug use: No  . Sexual activity: Yes    Partners: Male  Other Topics Concern  . Not on file  Social History Narrative   Patient lives with her husband here in [place], his name is Tanya Simpson.    Patient works as a Holiday representative at [employer].    Patient is recently from [place].       Hopeful to get back to exercising once the weather cools down.    She was participating in [employer], however the pandemic has made this difficult for her.    Social Determinants of Health   Financial Resource Strain: Low Risk   . Difficulty of Paying Living Expenses: Not hard at all  Food Insecurity: Food Insecurity Present  . Worried About Programme researcher, broadcasting/film/video in the Last Year: Sometimes true  . Ran Out of Food in the Last Year: Sometimes true  Transportation Needs: No Transportation Needs  . Lack of Transportation (Medical): No  . Lack of Transportation (Non-Medical): No  Physical Activity: Insufficiently Active  . Days of Exercise  per Week: 7 days  . Minutes of Exercise per Session: 10 min  Stress: Stress Concern Present  . Feeling of Stress : To some extent  Social Connections: Moderately Isolated  . Frequency of Communication with Friends and Family: Once a week  . Frequency of Social Gatherings with Friends and Family: Never  . Attends Religious Services: More than 4 times per year  . Active Member of Clubs or Organizations: No  . Attends Banker Meetings: Never  . Marital Status: Married   Additional Social History:                         Sleep: Fair  Appetite:  Fair  Current Medications: Current Facility-Administered Medications  Medication Dose Route  Frequency Provider Last Rate Last Admin  . acetaminophen (TYLENOL) tablet 650 mg  650 mg Oral Q6H PRN Shaletta Hinostroza, Jackquline Denmark, MD   650 mg at 04/07/20 0813  . alum & mag hydroxide-simeth (MAALOX/MYLANTA) 200-200-20 MG/5ML suspension 30 mL  30 mL Oral Q4H PRN Tayquan Gassman T, MD      . benztropine (COGENTIN) tablet 1 mg  1 mg Oral BID Meliyah Simon, Jackquline Denmark, MD   1 mg at 04/07/20 9509  . haloperidol (HALDOL) tablet 15 mg  15 mg Oral QHS Cashius Grandstaff T, MD   15 mg at 04/06/20 2115  . lamoTRIgine (LAMICTAL) tablet 100 mg  100 mg Oral QHS Ottavio Norem, Jackquline Denmark, MD   100 mg at 04/06/20 2116  . magnesium hydroxide (MILK OF MAGNESIA) suspension 30 mL  30 mL Oral Daily PRN Terin Dierolf, Jackquline Denmark, MD        Lab Results: No results found for this or any previous visit (from the past 48 hour(s)).  Blood Alcohol level:  Lab Results  Component Value Date   ETH <10 03/28/2020    Metabolic Disorder Labs: Lab Results  Component Value Date   HGBA1C 5.4 04/03/2020   MPG 108 04/03/2020   No results found for: PROLACTIN Lab Results  Component Value Date   CHOL 206 (H) 04/03/2020   TRIG 91 04/03/2020   HDL 62 04/03/2020   CHOLHDL 3.3 04/03/2020   VLDL 18 04/03/2020   LDLCALC 126 (H) 04/03/2020   LDLCALC 107 (H) 03/26/2020    Physical Findings: AIMS:  , ,  ,  ,    CIWA:    COWS:     Musculoskeletal: Strength & Muscle Tone: within normal limits Gait & Station: normal Patient leans: N/A  Psychiatric Specialty Exam: Physical Exam Vitals and nursing note reviewed.  Constitutional:      Appearance: She is well-developed.  HENT:     Head: Normocephalic and atraumatic.  Eyes:     Conjunctiva/sclera: Conjunctivae normal.     Pupils: Pupils are equal, round, and reactive to light.  Cardiovascular:     Heart sounds: Normal heart sounds.  Pulmonary:     Effort: Pulmonary effort is normal.  Abdominal:     Palpations: Abdomen is soft.  Musculoskeletal:        General: Normal range of motion.     Cervical back:  Normal range of motion.  Skin:    General: Skin is warm and dry.  Neurological:     General: No focal deficit present.     Mental Status: She is alert.  Psychiatric:        Attention and Perception: She is inattentive.        Mood and Affect: Mood is  anxious. Affect is labile.        Speech: Speech is rapid and pressured.        Behavior: Behavior is agitated.        Thought Content: Thought content is paranoid and delusional.        Cognition and Memory: Cognition is impaired. Memory is impaired.        Judgment: Judgment is inappropriate.     Review of Systems  Constitutional: Negative.   HENT: Negative.   Eyes: Negative.   Respiratory: Negative.   Cardiovascular: Negative.   Gastrointestinal: Negative.   Musculoskeletal: Negative.   Skin: Negative.   Neurological: Negative.   Psychiatric/Behavioral: Positive for dysphoric mood. The patient is nervous/anxious.     Blood pressure 132/72, pulse 85, temperature 98.3 F (36.8 C), temperature source Oral, resp. rate 18, height 5\' 3"  (1.6 m), weight 66.7 kg, SpO2 100 %.Body mass index is 26.04 kg/m.  General Appearance: Casual  Eye Contact:  Fair  Speech:  Blocked  Volume:  Normal  Mood:  Anxious and Irritable  Affect:  Labile  Thought Process:  Irrelevant  Orientation:  Negative  Thought Content:  Illogical, Paranoid Ideation, Rumination and Tangential  Suicidal Thoughts:  No  Homicidal Thoughts:  No  Memory:  Immediate;   Fair Recent;   Poor Remote;   Poor  Judgement:  Impaired  Insight:  Shallow  Psychomotor Activity:  Decreased  Concentration:  Concentration: Fair  Recall:  Fair  Fund of Knowledge:  Fair  Language:  Good  Akathisia:  No  Handed:  Right  AIMS (if indicated):     Assets:  Desire for Improvement Housing Physical Health Resilience Social Support  ADL's:  Intact  Cognition:  Impaired,  Mild  Sleep:  Number of Hours: 7     Treatment Plan Summary: Daily contact with patient to assess and  evaluate symptoms and progress in treatment, Medication management and Plan Continues to have clear-cut psychotic symptoms agitation and mood instability and be at high risk of suicide given past behavior.  Continue full dose antipsychotic medication and mood stabilizers and encourage group attendance.  Ongoing assessment.  I let patient know that she was not considered safe for discharge at this time.  , MD 04/07/2020, 11:28 AM

## 2020-04-07 NOTE — Plan of Care (Signed)
D- Patient alert and oriented. Patient presented in a preoccupied, but pleasant mood on assessment stating that she slept fine last night, "I was in a deep sleep". Patient complained of right-sided chest pain, "from when my husband gave me chest compressions", rating it a "5/10". Patient did request pain medication from this writer. Patient denied anxiety, but she did report a depression level of "2/10", on her self-inventory, however, she did not endorse any of this to this Clinical research associate. Patient also denied SI, HI, AVH at this time. Patient had no stated goals for today, however, she did report that she wanted to speak with the doctor.  A- Scheduled medications administered to patient, per MD orders. Support and encouragement provided.  Routine safety checks conducted every 15 minutes.  Patient informed to notify staff with problems or concerns.  R- No adverse drug reactions noted. Patient contracts for safety at this time. Patient compliant with medications and treatment plan. Patient receptive, calm, and cooperative. Patient interacts well with others on the unit.  Patient remains safe at this time.  Problem: Education: Goal: Ability to make informed decisions regarding treatment will improve Outcome: Progressing   Problem: Coping: Goal: Coping ability will improve Outcome: Progressing   Problem: Health Behavior/Discharge Planning: Goal: Identification of resources available to assist in meeting health care needs will improve Outcome: Progressing   Problem: Medication: Goal: Compliance with prescribed medication regimen will improve Outcome: Progressing   Problem: Self-Concept: Goal: Ability to disclose and discuss suicidal ideas will improve Outcome: Progressing Goal: Will verbalize positive feelings about self Outcome: Progressing   Problem: Education: Goal: Knowledge of [facility] Education information/materials will improve Outcome: Progressing Goal: Emotional status will  improve Outcome: Progressing Goal: Mental status will improve Outcome: Progressing Goal: Verbalization of understanding the information provided will improve Outcome: Progressing   Problem: Activity: Goal: Interest or engagement in activities will improve Outcome: Progressing Goal: Sleeping patterns will improve Outcome: Progressing   Problem: Coping: Goal: Ability to verbalize frustrations and anger appropriately will improve Outcome: Progressing Goal: Ability to demonstrate self-control will improve Outcome: Progressing   Problem: Health Behavior/Discharge Planning: Goal: Identification of resources available to assist in meeting health care needs will improve Outcome: Progressing Goal: Compliance with treatment plan for underlying cause of condition will improve Outcome: Progressing   Problem: Physical Regulation: Goal: Ability to maintain clinical measurements within normal limits will improve Outcome: Progressing   Problem: Safety: Goal: Periods of time without injury will increase Outcome: Progressing

## 2020-04-07 NOTE — Progress Notes (Signed)
Recreation Therapy Notes   Date: 04/07/2020  Time: 9:30 am  Location: Craft room   Behavioral response: Appropriate  Intervention Topic: Necessities   Discussion/Intervention:  Group content on today was focused on necessities. The group defined necessities and how they determine their necessities. Individuals expressed how many necessities they have and if it changes from day to day. Patients described the difference between wants and needs. The group explained how they have overspent on wants in the past. Individuals described a reoccurring necessity for them. The intervention "What I need" helped patients differentiate between wants and needs.  Clinical Observations/Feedback:  Patient came to group and defined necessities as food and shelter. She explained that necessities are the basics in life. Participant stated that necessities are important for survival and functioning in life.   Individual was actively social with peers and staff while participating in the intervention.  Yaretsi Humphres LRT/CTRS          Merryn Thaker 04/07/2020 10:31 AM

## 2020-04-07 NOTE — Plan of Care (Signed)
  Problem: Education: Goal: Ability to make informed decisions regarding treatment will improve Outcome: Progressing   Problem: Coping: Goal: Coping ability will improve Outcome: Progressing   Problem: Health Behavior/Discharge Planning: Goal: Identification of resources available to assist in meeting health care needs will improve Outcome: Progressing   Problem: Medication: Goal: Compliance with prescribed medication regimen will improve Outcome: Progressing   

## 2020-04-07 NOTE — Progress Notes (Signed)
Patient has been pleasant and cooperative. Denies SI HI and AVH. Socializing with same-age peers. No complaints.

## 2020-04-08 ENCOUNTER — Ambulatory Visit: Payer: Medicare Other | Admitting: Psychologist

## 2020-04-08 MED ORDER — LITHIUM CARBONATE ER 300 MG PO TBCR
300.0000 mg | EXTENDED_RELEASE_TABLET | Freq: Two times a day (BID) | ORAL | Status: DC
  Administered 2020-04-08 – 2020-04-11 (×7): 300 mg via ORAL
  Filled 2020-04-08 (×7): qty 1

## 2020-04-08 NOTE — Plan of Care (Signed)
  Problem: Education: Goal: Ability to make informed decisions regarding treatment will improve Outcome: Progressing   Problem: Coping: Goal: Coping ability will improve Outcome: Progressing   Problem: Health Behavior/Discharge Planning: Goal: Identification of resources available to assist in meeting health care needs will improve Outcome: Progressing   Problem: Medication: Goal: Compliance with prescribed medication regimen will improve Outcome: Progressing   

## 2020-04-08 NOTE — Progress Notes (Signed)
   04/08/20 1400  Clinical Encounter Type  Visited With Patient  Visit Type Initial;Spiritual support;Social support;Behavioral Health  Referral From Chaplain  Consult/Referral To Chaplain  Ch held group session in [facility] today. Today's topic was Angry. How do you handle angry. Pt participated in group. Ch will follow-up at later date.
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
## 2020-04-08 NOTE — Progress Notes (Signed)
Recreation Therapy Notes   Date: 04/08/2020  Time: 9:30 am  Location: Courtyard    Behavioral response: Appropriate  Intervention Topic: Social skills  Discussion/Intervention:  Group content on today was focused on social skills. The group defined social skills and identified ways they use social skills. Patients expressed what obstacles they face when trying to be social. Participants described the importance of social skills. The group listed ways to improve social skills and reasons to improve social skills. Individuals had an opportunity to learn new and improve social skills as well as identify their weaknesses. Clinical Observations/Feedback:  Patient came to group and was actively social with peers and staff while participating in the intervention.  Skilynn Durney LRT/CTRS         Zeniah Briney 04/08/2020 11:03 AM

## 2020-04-08 NOTE — [facility] Group Notes (Signed)
Balance In Life 04/08/2020 1PM  Type of Therapy/Topic:  Group Therapy:  Balance in Life  Participation Level:  Minimal  Description of Group:   This group will address the concept of balance and how it feels and looks when one is unbalanced. Patients will be encouraged to process areas in their lives that are out of balance and identify reasons for remaining unbalanced. Facilitators will guide patients in utilizing problem-solving interventions to address and correct the stressor making their life unbalanced. Understanding and applying boundaries will be explored and addressed for obtaining and maintaining a balanced life. Patients will be encouraged to explore ways to assertively make their unbalanced needs known to significant others in their lives, using other group members and facilitator for support and feedback.  Therapeutic Goals: 1. Patient will identify two or more emotions or situations they have that consume much of in their lives. 2. Patient will identify signs/triggers that life has become out of balance:  3. Patient will identify two ways to set boundaries in order to achieve balance in their lives:  4. Patient will demonstrate ability to communicate their needs through discussion and/or role plays  Summary of Patient Progress: Minimal participation during group session. The pt completed group exercise entitled"dont sweat the small stuff" but did not engage in group discussion. Pt sat quietly and respected boundaries during session.   Therapeutic Modalities:   Cognitive Behavioral Therapy Solution-Focused Therapy Assertiveness Training  Ulyses Panico Philip Aspen, LCSW

## 2020-04-08 NOTE — Plan of Care (Signed)
Patient presents with paranoia   Problem: Education: Goal: Emotional status will improve Outcome: Not Progressing Goal: Mental status will improve Outcome: Not Progressing   

## 2020-04-08 NOTE — Progress Notes (Signed)
[facility] MD Progress Note  04/08/2020 10:44 AM Tanya Simpson  MRN:  [id number] Subjective: Follow-up for this patient with bipolar disorder.  Patient continues to be disorganized paranoid delusional.  Talks about people trying to hurt her.  Has a hard time organizing her thoughts.  Still pressured and intrusive much of the time. Principal Problem: Bipolar affective disorder, current episode manic with psychotic symptoms (HCC) Diagnosis: Principal Problem:   Bipolar affective disorder, current episode manic with psychotic symptoms (HCC) Active Problems:   Acute psychosis (HCC)  Total Time spent with patient: 30 minutes  Past Psychiatric History: History of bipolar disorder several serious suicide attempts  Past Medical History:  Past Medical History:  Diagnosis Date  . Allergic rhinitis   . Anxiety   . Depression   . Schizophrenia [facility])     Past Surgical History:  Procedure Laterality Date  . TONSILLECTOMY     Family History:  Family History  Problem Relation Age of Onset  . Hypertension Mother   . Impulse control disorder Mother   . CVA Father   . Depression Father    Family Psychiatric  History: See previous Social History:  Social History   Substance and Sexual Activity  Alcohol Use Yes  . Alcohol/week: 1.0 standard drink  . Types: 1 Glasses of wine per week   Comment: very little     Social History   Substance and Sexual Activity  Drug Use No    Social History   Socioeconomic History  . Marital status: Married    Spouse name: [name]  . Number of children: 2  . Years of education: 16  . Highest education level: Bachelor's degree (e.g., BA, AB, BS)  Occupational History  . Occupation: [employer]  Tobacco Use  . Smoking status: Former Smoker    Packs/day: 1.00    Years: 1.00    Pack years: 1.00  . Smokeless tobacco: Never Used  Vaping Use  . Vaping Use: Never used  Substance and Sexual Activity  . Alcohol use: Yes    Alcohol/week: 1.0 standard drink     Types: 1 Glasses of wine per week    Comment: very little  . Drug use: No  . Sexual activity: Yes    Partners: Male  Other Topics Concern  . Not on file  Social History Narrative   Patient lives with her husband here in [place], his name is [name].    Patient works as a Holiday representative at [employer].    Patient is recently from [place].       Hopeful to get back to exercising once the weather cools down.    She was participating in [employer], however the pandemic has made this difficult for her.    Social Determinants of Health   Financial Resource Strain: Low Risk   . Difficulty of Paying Living Expenses: Not hard at all  Food Insecurity: Food Insecurity Present  . Worried About Programme researcher, broadcasting/film/video in the Last Year: Sometimes true  . Ran Out of Food in the Last Year: Sometimes true  Transportation Needs: No Transportation Needs  . Lack of Transportation (Medical): No  . Lack of Transportation (Non-Medical): No  Physical Activity: Insufficiently Active  . Days of Exercise per Week: 7 days  . Minutes of Exercise per Session: 10 min  Stress: Stress Concern Present  . Feeling of Stress : To some extent  Social Connections: Moderately Isolated  . Frequency of Communication with Friends and Family: Once a  week  . Frequency of Social Gatherings with Friends and Family: Never  . Attends Religious Services: More than 4 times per year  . Active Member of Clubs or Organizations: No  . Attends Banker Meetings: Never  . Marital Status: Married   Additional Social History:                         Sleep: Fair  Appetite:  Fair  Current Medications: Current Facility-Administered Medications  Medication Dose Route Frequency Provider Last Rate Last Admin  . acetaminophen (TYLENOL) tablet 650 mg  650 mg Oral Q6H PRN Tilmon Wisehart, Jackquline Denmark, MD   650 mg at 04/07/20 0813  . alum & mag hydroxide-simeth (MAALOX/MYLANTA) 200-200-20 MG/5ML suspension 30 mL  30 mL Oral Q4H PRN  Angel Hobdy T, MD      . benztropine (COGENTIN) tablet 1 mg  1 mg Oral BID Keimari  T, MD   1 mg at 04/08/20 0831  . haloperidol (HALDOL) tablet 15 mg  15 mg Oral QHS Dayon Witt, Jackquline Denmark, MD   15 mg at 04/07/20 2136  . lithium carbonate (LITHOBID) CR tablet 300 mg  300 mg Oral Q12H Tredarius Cobern T, MD      . magnesium hydroxide (MILK OF MAGNESIA) suspension 30 mL  30 mL Oral Daily PRN Adrijana Haros, Jackquline Denmark, MD        Lab Results: No results found for this or any previous visit (from the past 48 hour(s)).  Blood Alcohol level:  Lab Results  Component Value Date   ETH <10 03/28/2020    Metabolic Disorder Labs: Lab Results  Component Value Date   HGBA1C 5.4 04/03/2020   MPG 108 04/03/2020   No results found for: PROLACTIN Lab Results  Component Value Date   CHOL 206 (H) 04/03/2020   TRIG 91 04/03/2020   HDL 62 04/03/2020   CHOLHDL 3.3 04/03/2020   VLDL 18 04/03/2020   LDLCALC 126 (H) 04/03/2020   LDLCALC 107 (H) 03/26/2020    Physical Findings: AIMS:  , ,  ,  ,    CIWA:    COWS:     Musculoskeletal: Strength & Muscle Tone: within normal limits Gait & Station: normal Patient leans: N/A  Psychiatric Specialty Exam: Physical Exam Constitutional:      Appearance: She is well-developed.  HENT:     Head: Normocephalic and atraumatic.  Eyes:     Conjunctiva/sclera: Conjunctivae normal.     Pupils: Pupils are equal, round, and reactive to light.  Cardiovascular:     Heart sounds: Normal heart sounds.  Pulmonary:     Effort: Pulmonary effort is normal.  Abdominal:     Palpations: Abdomen is soft.  Musculoskeletal:        General: Normal range of motion.     Cervical back: Normal range of motion.  Skin:    General: Skin is warm and dry.  Neurological:     General: No focal deficit present.     Mental Status: She is alert.  Psychiatric:        Attention and Perception: She is inattentive.        Mood and Affect: Mood is anxious. Affect is labile and inappropriate.         Speech: Speech is rapid and pressured.        Behavior: Behavior is agitated. Behavior is not aggressive.        Thought Content: Thought content is paranoid and delusional.  Cognition and Memory: Cognition is impaired.        Judgment: Judgment is inappropriate.     Review of Systems  Constitutional: Negative.   HENT: Negative.   Eyes: Negative.   Respiratory: Negative.   Cardiovascular: Negative.   Gastrointestinal: Negative.   Musculoskeletal: Negative.   Skin: Negative.   Neurological: Negative.   Psychiatric/Behavioral: Positive for confusion, decreased concentration, dysphoric mood and sleep disturbance. The patient is nervous/anxious.     Blood pressure 116/69, pulse 88, temperature 98.5 F (36.9 C), temperature source Oral, resp. rate 17, height 5\' 3"  (1.6 m), weight 66.7 kg, SpO2 99 %.Body mass index is 26.04 kg/m.  General Appearance: Casual  Eye Contact:  Fair  Speech:  Pressured  Volume:  Increased  Mood:  Anxious, Dysphoric and Irritable  Affect:  Congruent  Thought Process:  Disorganized  Orientation:  Full (Time, Place, and Person)  Thought Content:  Illogical, Delusions, Ideas of Reference:   Paranoia, Paranoid Ideation, Rumination and Tangential  Suicidal Thoughts:  No  Homicidal Thoughts:  No  Memory:  Immediate;   Fair Recent;   Fair Remote;   Fair  Judgement:  Fair  Insight:  Fair  Psychomotor Activity:  Normal  Concentration:  Concentration: Poor  Recall:  Poor  Fund of Knowledge:  Fair  Language:  Fair  Akathisia:  No  Handed:  Right  AIMS (if indicated):     Assets:  Desire for Improvement Housing Physical Health Resilience Social Support  ADL's:  Impaired  Cognition:  Impaired,  Mild  Sleep:  Number of Hours: 7     Treatment Plan Summary: Daily contact with patient to assess and evaluate symptoms and progress in treatment, Medication management and Plan Patient with bipolar disorder.  Minimal improvement so far.  We talked  about medication and I propose adding lithium for her bipolar disorder with mood instability.  Patient was hesitant to take 3 psychiatric medicines so I suggest that we discontinue the lamotrigine which is less indicated for acute symptoms.  We will start lithium 600 twice a day and discontinue lamotrigine continue current haloperidol.  Patient remains psychotic agitated and high risk of suicide and not ready for discharge.  , MD 04/08/2020, 10:44 AM
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
## 2020-04-08 NOTE — Progress Notes (Signed)
Patient was pleasant during assessment denying SI/HI/AVH. Patient presets with a better affect this evening compared to last night. Patient compliant with medication administration per MD orders. Patient denies anxiety, depression and pain with this Clinical research associate. Patient given education, support and encouragement to be active in her treatment plan. Patient observed interacting appropriately with staff and peers. Patient being monitored Q 15 minutes for safety. Pt remains safe on the unit.

## 2020-04-08 NOTE — Tx Team (Signed)
Interdisciplinary Treatment and Diagnostic Plan Update  04/08/2020 Time of Session: 8:30AM Tanya Simpson MRN: [id number]  Principal Diagnosis: Bipolar affective disorder, current episode manic with psychotic symptoms (HCC)  Secondary Diagnoses: Principal Problem:   Bipolar affective disorder, current episode manic with psychotic symptoms (HCC) Active Problems:   Acute psychosis (HCC)   Current Medications:  Current Facility-Administered Medications  Medication Dose Route Frequency Provider Last Rate Last Admin  . acetaminophen (TYLENOL) tablet 650 mg  650 mg Oral Q6H PRN Clapacs, Jackquline Denmark, MD   650 mg at 04/07/20 0813  . alum & mag hydroxide-simeth (MAALOX/MYLANTA) 200-200-20 MG/5ML suspension 30 mL  30 mL Oral Q4H PRN Clapacs, John T, MD      . benztropine (COGENTIN) tablet 1 mg  1 mg Oral BID Clapacs, John T, MD   1 mg at 04/08/20 0831  . haloperidol (HALDOL) tablet 15 mg  15 mg Oral QHS Clapacs, Jackquline Denmark, MD   15 mg at 04/07/20 2136  . lithium carbonate (LITHOBID) CR tablet 300 mg  300 mg Oral Q12H Clapacs, John T, MD   300 mg at 04/08/20 1117  . magnesium hydroxide (MILK OF MAGNESIA) suspension 30 mL  30 mL Oral Daily PRN Clapacs, Jackquline Denmark, MD       PTA Medications: Medications Prior to Admission  Medication Sig Dispense Refill Last Dose  . loratadine (CLARITIN) 10 MG tablet Take 1 tablet (10 mg total) by mouth daily. 90 tablet 2   . Multiple Vitamin (MULTIVITAMIN) tablet Take 1 tablet by mouth every other day.       Patient Stressors:    Patient Strengths:    Treatment Modalities: Medication Management, Group therapy, Case management,  1 to 1 session with clinician, Psychoeducation, Recreational therapy.   Physician Treatment Plan for Primary Diagnosis: Bipolar affective disorder, current episode manic with psychotic symptoms (HCC) Long Term Goal(s): Improvement in symptoms so as ready for discharge Improvement in symptoms so as ready for discharge   Short Term Goals: Ability  to disclose and discuss suicidal ideas Ability to demonstrate self-control will improve Ability to identify and develop effective coping behaviors will improve Ability to maintain clinical measurements within normal limits will improve Compliance with prescribed medications will improve  Medication Management: Evaluate patient's response, side effects, and tolerance of medication regimen.  Therapeutic Interventions: 1 to 1 sessions, Unit Group sessions and Medication administration.  Evaluation of Outcomes: Not Progressing  Physician Treatment Plan for Secondary Diagnosis: Principal Problem:   Bipolar affective disorder, current episode manic with psychotic symptoms (HCC) Active Problems:   Acute psychosis (HCC)  Long Term Goal(s): Improvement in symptoms so as ready for discharge Improvement in symptoms so as ready for discharge   Short Term Goals: Ability to disclose and discuss suicidal ideas Ability to demonstrate self-control will improve Ability to identify and develop effective coping behaviors will improve Ability to maintain clinical measurements within normal limits will improve Compliance with prescribed medications will improve     Medication Management: Evaluate patient's response, side effects, and tolerance of medication regimen.  Therapeutic Interventions: 1 to 1 sessions, Unit Group sessions and Medication administration.  Evaluation of Outcomes: Not Progressing   RN Treatment Plan for Primary Diagnosis: Bipolar affective disorder, current episode manic with psychotic symptoms (HCC) Long Term Goal(s): Knowledge of disease and therapeutic regimen to maintain health will improve  Short Term Goals: Ability to demonstrate self-control, Ability to participate in decision making will improve, Ability to verbalize feelings will improve, Ability to disclose and discuss suicidal  ideas, Ability to identify and develop effective coping behaviors will improve and Compliance  with prescribed medications will improve  Medication Management: RN will administer medications as ordered by provider, will assess and evaluate patient's response and provide education to patient for prescribed medication. RN will report any adverse and/or side effects to prescribing provider.  Therapeutic Interventions: 1 on 1 counseling sessions, Psychoeducation, Medication administration, Evaluate responses to treatment, Monitor vital signs and CBGs as ordered, Perform/monitor CIWA, COWS, AIMS and Fall Risk screenings as ordered, Perform wound care treatments as ordered.  Evaluation of Outcomes: Not Progressing   LCSW Treatment Plan for Primary Diagnosis: Bipolar affective disorder, current episode manic with psychotic symptoms (HCC) Long Term Goal(s): Safe transition to appropriate next level of care at discharge, Engage patient in therapeutic group addressing interpersonal concerns.  Short Term Goals: Engage patient in aftercare planning with referrals and resources, Increase social support, Increase ability to appropriately verbalize feelings, Increase emotional regulation, Facilitate acceptance of mental health diagnosis and concerns and Increase skills for wellness and recovery  Therapeutic Interventions: Assess for all discharge needs, 1 to 1 time with Social worker, Explore available resources and support systems, Assess for adequacy in community support network, Educate family and significant other(s) on suicide prevention, Complete Psychosocial Assessment, Interpersonal group therapy.  Evaluation of Outcomes: Not Progressing   Progress in Treatment: Attending groups: No. Participating in groups: No. Taking medication as prescribed: No. Toleration medication: No. Family/Significant other contact made: Yes, individual(s) contacted:  SPE completed with patient, pt declined for CSW contact with collaterals. Patient understands diagnosis: Yes. Discussing patient identified  problems/goals with staff: Yes. Medical problems stabilized or resolved: Yes. Denies suicidal/homicidal ideation: Yes. Issues/concerns per patient self-inventory: No. Other: none  New problem(s) identified: No, Describe:  none  New Short Term/Long Term Goal(s): detox, elimination of symptoms of psychosis, medication management for mood stabilization; elimination of SI thoughts; development of comprehensive mental wellness/sobriety plan. Update: 04/08/2020:  No changes at this time.   Patient Goals:  "go home" Update: 04/08/2020:  No changes at this time.   Discharge Plan or Barriers:  Patient reports plans to return home and continue aftercare treatment with current mental health providers at [facility]  Update: 04/08/2020:  Patient remains manic and paranoid preventing a discharge at this time.  CSW will continue to assess for patient's needs.  Reason for Continuation of Hospitalization: Anxiety Depression Medication stabilization Suicidal ideation  Estimated Length of Stay: 1-7 days Update: 04/08/2020:  TBD  Recreational Therapy: Patient: N/A Patient Goal: Patient will engage in groups without prompting or encouragement from LRT x3 group sessions within 5 recreation therapy group sessions  Attendees: Patient:  04/08/2020 2:09 PM  Physician: Dr. Toni Amend, MD 04/08/2020 2:09 PM  Nursing: Torrie Mayers, RN 04/08/2020 2:09 PM  RN Care Manager: 04/08/2020 2:09 PM  Social Worker: Penni Homans, LCSW 04/08/2020 2:09 PM  Recreational Therapist:  04/08/2020 2:09 PM  Other:  04/08/2020 2:09 PM  Other:  04/08/2020 2:09 PM  Other: 04/08/2020 2:09 PM    Scribe for Treatment Team: Harden Mo, LCSW 04/08/2020 2:09 PM
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
## 2020-04-09 NOTE — Progress Notes (Addendum)
Recreation Therapy Notes    Date: 04/09/2020  Time: 10:30 am  Location: Outside Courtyard   Behavioral response: Appropriate  Group Type: Leisure  Participation level: Active  Communication: Patient was social with peers and staff.  Comments: N/A  [name] LRT/CTRS        Tanya Simpson 04/09/2020 1:28 PM

## 2020-04-09 NOTE — Progress Notes (Signed)
Recreation Therapy Notes   Date: 04/09/2020  Time: 9:30 am  Location: Craft room    Behavioral response: Appropriate  Intervention Topic: Problem Solving   Discussion/Intervention:  Group content on today was focused on problem solving. The group described what problem solving is. Patients expressed how problems affect them and how they deal with problems. Individuals identified healthy ways to deal with problems. Patients explained what normally happens to them when they do not deal with problems. The group expressed reoccurring problems for them. The group participated in the intervention "Ways to Solve problems" where patients were given a chance to explore different ways to solve problems.  Clinical Observations/Feedback:  Patient came to group late due to unknown reasons. Individual was actively social with peers and staff while participating in the intervention.  Janneth Krasner LRT/CTRS         Josha Weekley 04/09/2020 12:24 PM

## 2020-04-09 NOTE — Progress Notes (Signed)
[facility] MD Progress Note  04/09/2020 10:15 AM Tanya Simpson  MRN:  [id number] Subjective: Follow-up for this patient with bipolar disorder with manic or mixed presentation.  Patient continues to complain of multiple things including physical and mental issues.  She remains overly focused on discharge asking me multiple times to tell her what day she will be discharged even after I explained that I cannot give her a date for that.  She tells me that she thinks the lithium is causing her to be oversedated however I note that she is her usual hyperactive hyperverbal self up first thing in the morning pacing around today so it is not that sedated.  Denies current suicidal ideation although she is evasive and disorganized and when rambling in her speech quickly gets into negative thinking and expresses clear paranoid ideation. Principal Problem: Bipolar affective disorder, current episode manic with psychotic symptoms (HCC) Diagnosis: Principal Problem:   Bipolar affective disorder, current episode manic with psychotic symptoms (HCC) Active Problems:   Acute psychosis (HCC)  Total Time spent with patient: 30 minutes  Past Psychiatric History: Past history of several prior hospitalizations for psychotic disorder  Past Medical History:  Past Medical History:  Diagnosis Date   Allergic rhinitis    Anxiety    Depression    Schizophrenia (HCC)     Past Surgical History:  Procedure Laterality Date   TONSILLECTOMY     Family History:  Family History  Problem Relation Age of Onset   Hypertension Mother    Impulse control disorder Mother    CVA Father    Depression Father    Family Psychiatric  History: See previous Social History:  Social History   Substance and Sexual Activity  Alcohol Use Yes   Alcohol/week: 1.0 standard drink   Types: 1 Glasses of wine per week   Comment: very little     Social History   Substance and Sexual Activity  Drug Use No    Social History    Socioeconomic History   Marital status: Married    Spouse name: Tanya Simpson   Number of children: 2   Years of education: 16   Highest education level: Bachelor's degree (e.g., BA, AB, BS)  Occupational History   Occupation: [employer]  Tobacco Use   Smoking status: Former Smoker    Packs/day: 1.00    Years: 1.00    Pack years: 1.00   Smokeless tobacco: Never Used  Building services engineer Use: Never used  Substance and Sexual Activity   Alcohol use: Yes    Alcohol/week: 1.0 standard drink    Types: 1 Glasses of wine per week    Comment: very little   Drug use: No   Sexual activity: Yes    Partners: Male  Other Topics Concern   Not on file  Social History Narrative   Patient lives with her husband here in [place], his name is Tanya Simpson.    Patient works as a Holiday representative at [employer].    Patient is recently from [place].       Hopeful to get back to exercising once the weather cools down.    She was participating in [employer], however the pandemic has made this difficult for her.    Social Determinants of Health   Financial Resource Strain: Low Risk    Difficulty of Paying Living Expenses: Not hard at all  Food Insecurity: Food Insecurity Present   Worried About Programme researcher, broadcasting/film/video in the Last Year:  Sometimes true   Ran Out of Food in the Last Year: Sometimes true  Transportation Needs: No Transportation Needs   Lack of Transportation (Medical): No   Lack of Transportation (Non-Medical): No  Physical Activity: Insufficiently Active   Days of Exercise per Week: 7 days   Minutes of Exercise per Session: 10 min  Stress: Stress Concern Present   Feeling of Stress : To some extent  Social Connections: Moderately Isolated   Frequency of Communication with Friends and Family: Once a week   Frequency of Social Gatherings with Friends and Family: Never   Attends Religious Services: More than 4 times per year   Active Member of [employer] or Organizations: No    Attends Engineer, structural: Never   Marital Status: Married   Additional Social History:                         Sleep: Fair  Appetite:  Fair  Current Medications: Current Facility-Administered Medications  Medication Dose Route Frequency Provider Last Rate Last Admin   acetaminophen (TYLENOL) tablet 650 mg  650 mg Oral Q6H PRN Salene Mohamud T, MD   650 mg at 04/07/20 0813   alum & mag hydroxide-simeth (MAALOX/MYLANTA) 200-200-20 MG/5ML suspension 30 mL  30 mL Oral Q4H PRN Jannie Doyle T, MD       benztropine (COGENTIN) tablet 1 mg  1 mg Oral BID Arden Tinoco T, MD   1 mg at 04/08/20 1659   haloperidol (HALDOL) tablet 15 mg  15 mg Oral QHS Mukesh Kornegay T, MD   15 mg at 04/08/20 2135   lithium carbonate (LITHOBID) CR tablet 300 mg  300 mg Oral Q12H Courtney Bellizzi T, MD   300 mg at 04/08/20 2135   magnesium hydroxide (MILK OF MAGNESIA) suspension 30 mL  30 mL Oral Daily PRN Britny Riel, Jackquline Denmark, MD        Lab Results: No results found for this or any previous visit (from the past 48 hour(s)).  Blood Alcohol level:  Lab Results  Component Value Date   ETH <10 03/28/2020    Metabolic Disorder Labs: Lab Results  Component Value Date   HGBA1C 5.4 04/03/2020   MPG 108 04/03/2020   No results found for: PROLACTIN Lab Results  Component Value Date   CHOL 206 (H) 04/03/2020   TRIG 91 04/03/2020   HDL 62 04/03/2020   CHOLHDL 3.3 04/03/2020   VLDL 18 04/03/2020   LDLCALC 126 (H) 04/03/2020   LDLCALC 107 (H) 03/26/2020    Physical Findings: AIMS:  , ,  ,  ,    CIWA:    COWS:     Musculoskeletal: Strength & Muscle Tone: within normal limits Gait & Station: normal Patient leans: N/A  Psychiatric Specialty Exam: Physical Exam Vitals and nursing note reviewed.  Constitutional:      Appearance: She is well-developed.  HENT:     Head: Normocephalic and atraumatic.  Eyes:     Conjunctiva/sclera: Conjunctivae normal.     Pupils: Pupils are  equal, round, and reactive to light.  Cardiovascular:     Heart sounds: Normal heart sounds.  Pulmonary:     Effort: Pulmonary effort is normal.  Abdominal:     Palpations: Abdomen is soft.  Musculoskeletal:        General: Normal range of motion.     Cervical back: Normal range of motion.  Skin:    General: Skin is warm and dry.  Neurological:     General: No focal deficit present.     Mental Status: She is alert.  Psychiatric:        Attention and Perception: She is inattentive.        Mood and Affect: Mood is anxious. Affect is labile.        Speech: Speech is rapid and pressured.        Behavior: Behavior is agitated. Behavior is not aggressive.        Thought Content: Thought content is paranoid. Thought content does not include homicidal or suicidal ideation.        Cognition and Memory: Cognition is impaired.        Judgment: Judgment is impulsive.     Review of Systems  Constitutional: Negative.   HENT: Negative.   Eyes: Negative.   Respiratory: Negative.   Cardiovascular: Negative.   Gastrointestinal: Negative.   Musculoskeletal: Negative.   Skin: Negative.   Neurological: Negative.   Psychiatric/Behavioral: Positive for agitation, behavioral problems, confusion, decreased concentration, dysphoric mood and sleep disturbance. The patient is nervous/anxious and is hyperactive.     Blood pressure 129/71, pulse 90, temperature 97.9 F (36.6 C), temperature source Oral, resp. rate 17, height 5\' 3"  (1.6 m), weight 66.7 kg, SpO2 100 %.Body mass index is 26.04 kg/m.  General Appearance: Casual  Eye Contact:  Fair  Speech:  Pressured  Volume:  Increased  Mood:  Anxious, Dysphoric and Irritable  Affect:  Congruent  Thought Process:  Disorganized  Orientation:  Full (Time, Place, and Person)  Thought Content:  Illogical, Ideas of Reference:   Paranoia, Paranoid Ideation and Tangential  Suicidal Thoughts:  No  Homicidal Thoughts:  No  Memory:  Immediate;    Fair Recent;   Fair Remote;   Fair  Judgement:  Poor  Insight:  Shallow  Psychomotor Activity:  Restlessness  Concentration:  Concentration: Poor  Recall:  of Knowledge:  Fair  Language:  Fair  Akathisia:  No  Handed:  Right  AIMS (if indicated):     Assets:  Desire for Improvement Financial Resources/Insurance Housing Physical Health Resilience Social Support  ADL's:  Impaired  Cognition:  Impaired,  Mild  Sleep:  Number of Hours: 8     Treatment Plan Summary: Daily contact with patient to assess and evaluate symptoms and progress in treatment, Medication management and Plan Patient with mixed bipolar disorder continues to show hyper verbality paranoia and disorganized thinking.  She denies suicidal thoughts but her affect remains hopeless and agitated and I think she is still at high risk for suicide especially given that this is the same presentation that resulted in her overdose before admission.  Plan is to continue current antipsychotic now with addition of lithium.  Coached patient offered a lot of support and encouragement.  Reassured her that she will eventually be able to get out of the hospital but we want to make sure she is safe.  No immediate change from medicine today as we just added the lithium.  She does get antsy at times but she is able to sit still during an interview and I do not think she is having akathisia.  [employer], MD 04/09/2020, 10:15 AM

## 2020-04-09 NOTE — Progress Notes (Addendum)
Pt is alert and oriented to person, place, time and situation. Pt is calm, cooperative, denies suicidal and homicidal ideation, denies hallucinations, denies feelings of depression and anxiety. Pt voices some delusional content, is also paranoid about "all the things my husband plans to do to ruin my life." Pt became paranoid about this Clinical research associate, and did not wish to talk to this nurse, instead asked to talk to the charge RN, stating to her, "The police are trying to frame me." Pt requests discharge because, "my dog is having nausea." Pt is visible, reluctant to take her medications, states, "my meds make me to sleepy." Pt encouraged to talk to her MD about the medications as well. Will continue to monitor pt per Q15 minute face checks and monitor for safety and progress.

## 2020-04-10 NOTE — Plan of Care (Signed)
  Problem: Education: Goal: Ability to make informed decisions regarding treatment will improve Outcome: Progressing   Problem: Coping: Goal: Coping ability will improve Outcome: Progressing   Problem: Health Behavior/Discharge Planning: Goal: Identification of resources available to assist in meeting health care needs will improve Outcome: Progressing   Problem: Medication: Goal: Compliance with prescribed medication regimen will improve Outcome: Progressing   Problem: Self-Concept: Goal: Ability to disclose and discuss suicidal ideas will improve Outcome: Progressing Goal: Will verbalize positive feelings about self Outcome: Progressing   Problem: Education: Goal: Knowledge of [place] [facility] Education information/materials will improve Outcome: Progressing Goal: Emotional status will improve Outcome: Progressing Goal: Mental status will improve Outcome: Progressing Goal: Verbalization of understanding the information provided will improve Outcome: Progressing   Problem: Activity: Goal: Interest or engagement in activities will improve Outcome: Progressing Goal: Sleeping patterns will improve Outcome: Progressing   Problem: Coping: Goal: Ability to verbalize frustrations and anger appropriately will improve Outcome: Progressing Goal: Ability to demonstrate self-control will improve Outcome: Progressing   Problem: Health Behavior/Discharge Planning: Goal: Identification of resources available to assist in meeting health care needs will improve Outcome: Progressing Goal: Compliance with treatment plan for underlying cause of condition will improve Outcome: Progressing   Problem: Physical Regulation: Goal: Ability to maintain clinical measurements within normal limits will improve Outcome: Progressing   Problem: Safety: Goal: Periods of time without injury will increase Outcome: Progressing   

## 2020-04-10 NOTE — [facility] Group Notes (Signed)
LCSW Group Therapy Note  04/10/2020 2:49 PM  Type of Therapy/Topic:  Group Therapy:  Balance in Life  Participation Level:  None  Description of Group:    This group will address the concept of balance and how it feels and looks when one is unbalanced. Patients will be encouraged to process areas in their lives that are out of balance and identify reasons for remaining unbalanced. Facilitators will guide patients in utilizing problem-solving interventions to address and correct the stressor making their life unbalanced. Understanding and applying boundaries will be explored and addressed for obtaining and maintaining a balanced life. Patients will be encouraged to explore ways to assertively make their unbalanced needs known to significant others in their lives, using other group members and facilitator for support and feedback.  Therapeutic Goals: 1. Patient will identify two or more emotions or situations they have that consume much of in their lives. 2. Patient will identify signs/triggers that life has become out of balance:  3. Patient will identify two ways to set boundaries in order to achieve balance in their lives:  4. Patient will demonstrate ability to communicate their needs through discussion and/or role plays  Summary of Patient Progress: Patient was present in group, however, patient did not engage in group discussion.  Patient left group early stating that she was not feeling well.  Therapeutic Modalities:   Cognitive Behavioral Therapy Solution-Focused Therapy Assertiveness Training  Penni Homans MSW, LCSW 04/10/2020 2:49 PM

## 2020-04-10 NOTE — Progress Notes (Signed)
Recreation Therapy Notes   Date: 04/10/2020  Time: 9:30 am  Location: Craft room    Behavioral response: Appropriate  Intervention Topic: Coping Skills    Discussion/Intervention:  Group content on today was focused on coping skills. The group defined what coping skills are and when they can be used. Individuals described how they normally cope with thing and the coping skills they normally use. Patients expressed why it is important to cope with things and how not coping with things can affect you. The group participated in the intervention "My coping box" and made coping boxes while adding coping skills they could use in the future to the box. Clinical Observations/Feedback:  Patient came to group and stated that bad coping skills can affect your health. Participant was fixated on discharge today and left group early and did not return.  Deckard Stuber LRT/CTRS         Ysidra Sopher 04/10/2020 12:35 PM

## 2020-04-10 NOTE — Plan of Care (Signed)
D: Pt alert and oriented x 4. Pt rates depression 2/10, hopelessness 2/10, and anxiety 2/10.Pt goal: "discharge." Pt reports energy level as normal and concentration as being good. Pt reports sleep last night as being good. Pt did not receive medications for sleep. Pt denies experiencing any pain at this time. Pt denies experiencing any SI/HI, or AVH at this time.   Pt hesitant to take medications, thinks she's taking too many pills, however took her medications willingly.  Pt has come to the NS less today however does still exhibit some paranoia in r/t her spouse.  A: Scheduled medications administered to pt, per MD orders. Support and encouragement provided. Frequent verbal contact made. Routine safety checks conducted q15 minutes.   R: No adverse drug reactions noted. Pt verbally contracts for safety at this time. Pt complaint with medications and treatment plan. Pt interacts well with others on the unit. Pt remains safe at this time. Will continue to monitor.   Problem: Medication: Goal: Compliance with prescribed medication regimen will improve Outcome: Progressing   Problem: Education: Goal: Ability to make informed decisions regarding treatment will improve Outcome: Not Progressing   Problem: Coping: Goal: Coping ability will improve Outcome: Not Progressing   Problem: Health Behavior/Discharge Planning: Goal: Identification of resources available to assist in meeting health care needs will improve Outcome: Not Progressing   Problem: Self-Concept: Goal: Ability to disclose and discuss suicidal ideas will improve Outcome: Not Progressing Goal: Will verbalize positive feelings about self Outcome: Not Progressing   Problem: Education: Goal: Knowledge of [place] [facility] Education information/materials will improve Outcome: Not Progressing Goal: Emotional status will improve Outcome: Not Progressing Goal: Mental status will improve Outcome: Not Progressing Goal:  Verbalization of understanding the information provided will improve Outcome: Not Progressing   Problem: Coping: Goal: Ability to demonstrate self-control will improve Outcome: Not Progressing   Problem: Health Behavior/Discharge Planning: Goal: Identification of resources available to assist in meeting health care needs will improve Outcome: Not Progressing Goal: Compliance with treatment plan for underlying cause of condition will improve Outcome: Not Progressing   Problem: Physical Regulation: Goal: Ability to maintain clinical measurements within normal limits will improve Outcome: Not Progressing

## 2020-04-10 NOTE — Progress Notes (Signed)
Patient is pleasant and eager to engage. She was cooperative and calm while receiving her prescribed meds.  She took meds and tolerated without incident.  She denies having any pain as well as  SI/HI/AVH during this encounter.  She is active on the unit and gets along well with other peers on the unit. She is safe with 25 minute safety checks and informed to contact staff with any concerns.

## 2020-04-10 NOTE — Progress Notes (Signed)
[facility] MD Progress Note  04/10/2020 3:02 PM Tanya Simpson  MRN:  [id number] Subjective: Follow-up for this lady with psychotic bipolar disorder.  Once again today all she wanted to talk about was being discharged.  As usual her reasons for this are all over the place and often contradict each other.  She is clearly still paranoid and rambles on about various people who are trying to hurt her.  No evidence of any side effects of medicine.  No sign of EPS.  Not nauseous or unsteady.  She does not talk about being suicidal but she also does not rule it out when she gets on a tangent about her paranoia. Principal Problem: Bipolar affective disorder, current episode manic with psychotic symptoms (HCC) Diagnosis: Principal Problem:   Bipolar affective disorder, current episode manic with psychotic symptoms (HCC) Active Problems:   Acute psychosis (HCC)  Total Time spent with patient: 30 minutes  Past Psychiatric History: Past history of chronic psychosis several prior serious decompensations  Past Medical History:  Past Medical History:  Diagnosis Date  . Allergic rhinitis   . Anxiety   . Depression   . Schizophrenia [facility])     Past Surgical History:  Procedure Laterality Date  . TONSILLECTOMY     Family History:  Family History  Problem Relation Age of Onset  . Hypertension Mother   . Impulse control disorder Mother   . CVA Father   . Depression Father    Family Psychiatric  History: See previous Social History:  Social History   Substance and Sexual Activity  Alcohol Use Yes  . Alcohol/week: 1.0 standard drink  . Types: 1 Glasses of wine per week   Comment: very little     Social History   Substance and Sexual Activity  Drug Use No    Social History   Socioeconomic History  . Marital status: Married    Spouse name: [name]  . Number of children: 2  . Years of education: 16  . Highest education level: Bachelor's degree (e.g., BA, AB, BS)  Occupational History  .  Occupation: [employer]  Tobacco Use  . Smoking status: Former Smoker    Packs/day: 1.00    Years: 1.00    Pack years: 1.00  . Smokeless tobacco: Never Used  Vaping Use  . Vaping Use: Never used  Substance and Sexual Activity  . Alcohol use: Yes    Alcohol/week: 1.0 standard drink    Types: 1 Glasses of wine per week    Comment: very little  . Drug use: No  . Sexual activity: Yes    Partners: Male  Other Topics Concern  . Not on file  Social History Narrative   Patient lives with her husband here in [place], his name is [name].    Patient works as a Holiday representative at [employer].    Patient is recently from [place].       Hopeful to get back to exercising once the weather cools down.    She was participating in [employer], however the pandemic has made this difficult for her.    Social Determinants of Health   Financial Resource Strain: Low Risk   . Difficulty of Paying Living Expenses: Not hard at all  Food Insecurity: Food Insecurity Present  . Worried About Programme researcher, broadcasting/film/video in the Last Year: Sometimes true  . Ran Out of Food in the Last Year: Sometimes true  Transportation Needs: No Transportation Needs  . Lack of Transportation (Medical):  No  . Lack of Transportation (Non-Medical): No  Physical Activity: Insufficiently Active  . Days of Exercise per Week: 7 days  . Minutes of Exercise per Session: 10 min  Stress: Stress Concern Present  . Feeling of Stress : To some extent  Social Connections: Moderately Isolated  . Frequency of Communication with Friends and Family: Once a week  . Frequency of Social Gatherings with Friends and Family: Never  . Attends Religious Services: More than 4 times per year  . Active Member of Clubs or Organizations: No  . Attends Banker Meetings: Never  . Marital Status: Married   Additional Social History:                         Sleep: Fair  Appetite:  Fair  Current Medications: Current  Facility-Administered Medications  Medication Dose Route Frequency Provider Last Rate Last Admin  . acetaminophen (TYLENOL) tablet 650 mg  650 mg Oral Q6H PRN Cali Hope, Jackquline Denmark, MD   650 mg at 04/09/20 1225  . alum & mag hydroxide-simeth (MAALOX/MYLANTA) 200-200-20 MG/5ML suspension 30 mL  30 mL Oral Q4H PRN Beanca Kiester T, MD      . benztropine (COGENTIN) tablet 1 mg  1 mg Oral BID Jeannette Maddy T, MD   1 mg at 04/10/20 0818  . haloperidol (HALDOL) tablet 15 mg  15 mg Oral QHS Quantavis Obryant, Jackquline Denmark, MD   15 mg at 04/09/20 2145  . lithium carbonate (LITHOBID) CR tablet 300 mg  300 mg Oral Q12H Yuepheng Schaller, Jackquline Denmark, MD   300 mg at 04/10/20 0818  . magnesium hydroxide (MILK OF MAGNESIA) suspension 30 mL  30 mL Oral Daily PRN Araiyah Cumpton, Jackquline Denmark, MD        Lab Results: No results found for this or any previous visit (from the past 48 hour(s)).  Blood Alcohol level:  Lab Results  Component Value Date   ETH <10 03/28/2020    Metabolic Disorder Labs: Lab Results  Component Value Date   HGBA1C 5.4 04/03/2020   MPG 108 04/03/2020   No results found for: PROLACTIN Lab Results  Component Value Date   CHOL 206 (H) 04/03/2020   TRIG 91 04/03/2020   HDL 62 04/03/2020   CHOLHDL 3.3 04/03/2020   VLDL 18 04/03/2020   LDLCALC 126 (H) 04/03/2020   LDLCALC 107 (H) 03/26/2020    Physical Findings: AIMS:  , ,  ,  ,    CIWA:    COWS:     Musculoskeletal: Strength & Muscle Tone: within normal limits Gait & Station: normal Patient leans: N/A  Psychiatric Specialty Exam: Physical Exam Vitals and nursing note reviewed.  Constitutional:      Appearance: She is well-developed.  HENT:     Head: Normocephalic and atraumatic.  Eyes:     Conjunctiva/sclera: Conjunctivae normal.     Pupils: Pupils are equal, round, and reactive to light.  Cardiovascular:     Heart sounds: Normal heart sounds.  Pulmonary:     Effort: Pulmonary effort is normal.  Abdominal:     Palpations: Abdomen is soft.   Musculoskeletal:        General: Normal range of motion.     Cervical back: Normal range of motion.  Skin:    General: Skin is warm and dry.  Neurological:     General: No focal deficit present.     Mental Status: She is alert.  Psychiatric:  Attention and Perception: She is inattentive.        Mood and Affect: Mood is anxious. Affect is labile.        Speech: Speech is rapid and pressured.        Behavior: Behavior is agitated.        Thought Content: Thought content is paranoid and delusional.        Cognition and Memory: Cognition is impaired.        Judgment: Judgment is impulsive.     Review of Systems  Constitutional: Negative.   HENT: Negative.   Eyes: Negative.   Respiratory: Negative.   Cardiovascular: Negative.   Gastrointestinal: Negative.   Musculoskeletal: Negative.   Skin: Negative.   Neurological: Negative.   Psychiatric/Behavioral: Positive for confusion, decreased concentration and dysphoric mood.    Blood pressure 128/80, pulse 84, temperature 98 F (36.7 C), temperature source Oral, resp. rate 17, height 5\' 3"  (1.6 m), weight 66.7 kg, SpO2 100 %.Body mass index is 26.04 kg/m.  General Appearance: Casual  Eye Contact:  Fair  Speech:  Pressured  Volume:  Increased  Mood:  Anxious and Irritable  Affect:  Inappropriate and Labile  Thought Process:  Disorganized  Orientation:  Full (Time, Place, and Person)  Thought Content:  Illogical, Delusions, Paranoid Ideation, Rumination and Tangential  Suicidal Thoughts:  Yes.  without intent/plan  Homicidal Thoughts:  No  Memory:  Immediate;   Fair Recent;   Fair Remote;   Fair  Judgement:  Impaired  Insight:  Shallow  Psychomotor Activity:  Restlessness  Concentration:  Concentration: Fair  Recall:  of Knowledge:  Fair  Language:  Fair  Akathisia:  No  Handed:  Right  AIMS (if indicated):     Assets:  Communication Skills Desire for Improvement Financial  Resources/Insurance Housing Physical Health Resilience Social Support  ADL's:  Intact  Cognition:  Impaired,  Mild  Sleep:  Number of Hours: 7.75     Treatment Plan Summary: Daily contact with patient to assess and evaluate symptoms and progress in treatment, Medication management and Plan Hard to tell whether she is any better.  On the other hand she is certainly still paranoid and disorganized and high risk if discharged.  I am going to check a lithium level tomorrow.  I asked her very seriously if she is compliant with her medicine because I am a little concerned that she could be cheeking the Haldol and she swears that she has been taking it.  I tried to make it clear to her again that she really needs to work with [employer] to get her illness better before I am ever going to feel safe discharging her.  [place], MD 04/10/2020, 3:02 PM

## 2020-04-10 NOTE — Progress Notes (Signed)
Patient presents animated and hyper- verbal upon arrival to med room. She is alert and oriented. She denies SI/HI/AVH depression and anxiety on this encounter. She is pleasant and easy to engage. She received prescribed medication and tolerated without incident. She was paranoid initially when given meds, but after counting them and confirming their name and dose she had no problem taking. She remains safe on th unit with 15 minute safety checks and informed to contact staff with any concerns.   Cleo Butler-Nicholson, LPN

## 2020-04-10 NOTE — Progress Notes (Signed)
BRIEF PHARMACY NOTE   This patient attended and participated in Medication Management Group counseling led by [facility] staff pharmacist.  This interactive class reviews basic information about prescription medications and education on personal responsibility in medication management.  The class also includes general knowledge of 3 main classes of behavioral medications, including antipsychotics, antidepressants, and mood stabilizers.     Patient came later in the session and behavior was appropriate for group setting.   Educational materials sourced from:  "Medication Do's and Don'ts" from [email or web address]   "Mental Health Medications" from [facility] FaxRack.[email or web address].shtml#part [id number]    Albina Billet, PharmD, BCPS Clinical Pharmacist 04/10/2020 3:28 PM

## 2020-04-10 NOTE — Plan of Care (Signed)
  Problem: Education: Goal: Ability to make informed decisions regarding treatment will improve Outcome: Progressing   Problem: Coping: Goal: Coping ability will improve Outcome: Progressing   Problem: Health Behavior/Discharge Planning: Goal: Identification of resources available to assist in meeting health care needs will improve Outcome: Progressing   Problem: Medication: Goal: Compliance with prescribed medication regimen will improve Outcome: Progressing   Problem: Self-Concept: Goal: Ability to disclose and discuss suicidal ideas will improve Outcome: Progressing Goal: Will verbalize positive feelings about self Outcome: Progressing   Problem: Education: Goal: Knowledge of [place] [facility] Education information/materials will improve Outcome: Progressing Goal: Emotional status will improve Outcome: Progressing Goal: Mental status will improve Outcome: Progressing Goal: Verbalization of understanding the information provided will improve Outcome: Progressing   Problem: Activity: Goal: Interest or engagement in activities will improve Outcome: Progressing Goal: Sleeping patterns will improve Outcome: Progressing   Problem: Coping: Goal: Ability to verbalize frustrations and anger appropriately will improve Outcome: Progressing Goal: Ability to demonstrate self-control will improve Outcome: Progressing   Problem: Health Behavior/Discharge Planning: Goal: Identification of resources available to assist in meeting health care needs will improve Outcome: Progressing Goal: Compliance with treatment plan for underlying cause of condition will improve Outcome: Progressing   Problem: Physical Regulation: Goal: Ability to maintain clinical measurements within normal limits will improve Outcome: Progressing   Problem: Safety: Goal: Periods of time without injury will increase Outcome: Progressing   

## 2020-04-11 LAB — LITHIUM LEVEL: Lithium Lvl: 0.64 mmol/L (ref 0.60–1.20)

## 2020-04-11 MED ORDER — LITHIUM CARBONATE ER 300 MG PO TBCR
300.0000 mg | EXTENDED_RELEASE_TABLET | Freq: Every day | ORAL | Status: DC
  Administered 2020-04-12 – 2020-04-21 (×10): 300 mg via ORAL
  Filled 2020-04-11 (×10): qty 1

## 2020-04-11 MED ORDER — LITHIUM CARBONATE ER 300 MG PO TBCR
600.0000 mg | EXTENDED_RELEASE_TABLET | Freq: Every day | ORAL | Status: DC
  Administered 2020-04-11 – 2020-04-21 (×11): 600 mg via ORAL
  Filled 2020-04-11 (×11): qty 2

## 2020-04-11 NOTE — Progress Notes (Signed)
   04/11/20 1445  Clinical Encounter Type  Visited With Patient  Visit Type Follow-up;Spiritual support;Social support;Behavioral Health  Referral From Chaplain  Consult/Referral To Chaplain  Ch visited with Pt in the recreation room. The conversation was short. Pt was saying she is ready to go home. Ch will follow-up with Pt.

## 2020-04-11 NOTE — Plan of Care (Signed)
D: Pt alert and oriented x 4. Pt rates depression 4/10, hopelessness 6/10, and anxiety 4/10.Pt goal: "Discharge counseling." Pt reports energy level as low and concentration as being poor . Pt reports sleep last night as being fair. Pt did not receive medications for sleep. Pt denies experiencing any pain at this time. Pt denies/reports experiencing any SI/HI, or AVH at this time.   When going to get pt from room for morning medication pt was tearful and made a statement about her mother being such a witch.  A: Scheduled medications administered to pt, per MD orders. Support and encouragement provided. Frequent verbal contact made. Routine safety checks conducted q15 minutes.   R: No adverse drug reactions noted. Pt verbally contracts for safety at this time. Pt complaint with medications and treatment plan. Pt interacts well with others on the unit. Pt remains safe at this time. Will continue to monitor.   Problem: Education: Goal: Ability to make informed decisions regarding treatment will improve Outcome: Not Progressing   Problem: Coping: Goal: Coping ability will improve Outcome: Not Progressing   Problem: Health Behavior/Discharge Planning: Goal: Identification of resources available to assist in meeting health care needs will improve Outcome: Not Progressing   Problem: Medication: Goal: Compliance with prescribed medication regimen will improve Outcome: Not Progressing   Problem: Self-Concept: Goal: Ability to disclose and discuss suicidal ideas will improve Outcome: Not Progressing Goal: Will verbalize positive feelings about self Outcome: Not Progressing   Problem: Education: Goal: Knowledge of [facility] [facility] Education information/materials will improve Outcome: Not Progressing Goal: Emotional status will improve Outcome: Not Progressing Goal: Mental status will improve Outcome: Not Progressing Goal: Verbalization of understanding the information provided will  improve Outcome: Not Progressing   Problem: Activity: Goal: Interest or engagement in activities will improve Outcome: Not Progressing Goal: Sleeping patterns will improve Outcome: Not Progressing   Problem: Coping: Goal: Ability to verbalize frustrations and anger appropriately will improve Outcome: Not Progressing Goal: Ability to demonstrate self-control will improve Outcome: Not Progressing   Problem: Health Behavior/Discharge Planning: Goal: Identification of resources available to assist in meeting health care needs will improve Outcome: Not Progressing Goal: Compliance with treatment plan for underlying cause of condition will improve Outcome: Not Progressing   Problem: Physical Regulation: Goal: Ability to maintain clinical measurements within normal limits will improve Outcome: Not Progressing   Problem: Safety: Goal: Periods of time without injury will increase Outcome: Not Progressing

## 2020-04-11 NOTE — Progress Notes (Signed)
[facility] MD Progress Note  04/11/2020 12:37 PM Tanya Simpson  MRN:  [id number] Subjective: Follow-up for patient with bipolar disorder.  Patient's mental status today is similar to what we have seen before.  Agitated anxious and irritable.  Demanding discharge.  Unable to stay on topic during conversation jumping around from 1 point to another repeating herself frequently.  Paces on the unit but not in a way that I think suggests akathisia.  Lithium level came back as 0.64. Principal Problem: Bipolar affective disorder, current episode manic with psychotic symptoms (HCC) Diagnosis: Principal Problem:   Bipolar affective disorder, current episode manic with psychotic symptoms (HCC) Active Problems:   Acute psychosis (HCC)  Total Time spent with patient: 30 minutes  Past Psychiatric History: Past history of bipolar disorder several previous suicide attempts  Past Medical History:  Past Medical History:  Diagnosis Date  . Allergic rhinitis   . Anxiety   . Depression   . Schizophrenia [facility])     Past Surgical History:  Procedure Laterality Date  . TONSILLECTOMY     Family History:  Family History  Problem Relation Age of Onset  . Hypertension Mother   . Impulse control disorder Mother   . CVA Father   . Depression Father    Family Psychiatric  History: See previous. Social History:  Social History   Substance and Sexual Activity  Alcohol Use Yes  . Alcohol/week: 1.0 standard drink  . Types: 1 Glasses of wine per week   Comment: very little     Social History   Substance and Sexual Activity  Drug Use No    Social History   Socioeconomic History  . Marital status: Married    Spouse name: [name]  . Number of children: 2  . Years of education: 16  . Highest education level: Bachelor's degree (e.g., BA, AB, BS)  Occupational History  . Occupation: [employer]  Tobacco Use  . Smoking status: Former Smoker    Packs/day: 1.00    Years: 1.00    Pack years: 1.00  . Smokeless  tobacco: Never Used  Vaping Use  . Vaping Use: Never used  Substance and Sexual Activity  . Alcohol use: Yes    Alcohol/week: 1.0 standard drink    Types: 1 Glasses of wine per week    Comment: very little  . Drug use: No  . Sexual activity: Yes    Partners: Male  Other Topics Concern  . Not on file  Social History Narrative   Patient lives with her husband here in [place], his name is [name].    Patient works as a Holiday representative at [employer].    Patient is recently from [place].       Hopeful to get back to exercising once the weather cools down.    She was participating in [employer], however the pandemic has made this difficult for her.    Social Determinants of Health   Financial Resource Strain: Low Risk   . Difficulty of Paying Living Expenses: Not hard at all  Food Insecurity: Food Insecurity Present  . Worried About Programme researcher, broadcasting/film/video in the Last Year: Sometimes true  . Ran Out of Food in the Last Year: Sometimes true  Transportation Needs: No Transportation Needs  . Lack of Transportation (Medical): No  . Lack of Transportation (Non-Medical): No  Physical Activity: Insufficiently Active  . Days of Exercise per Week: 7 days  . Minutes of Exercise per Session: 10 min  Stress:  Stress Concern Present  . Feeling of Stress : To some extent  Social Connections: Moderately Isolated  . Frequency of Communication with Friends and Family: Once a week  . Frequency of Social Gatherings with Friends and Family: Never  . Attends Religious Services: More than 4 times per year  . Active Member of Clubs or Organizations: No  . Attends Banker Meetings: Never  . Marital Status: Married   Additional Social History:                         Sleep: Fair  Appetite:  Fair  Current Medications: Current Facility-Administered Medications  Medication Dose Route Frequency Provider Last Rate Last Admin  . acetaminophen (TYLENOL) tablet 650 mg  650 mg Oral Q6H  PRN Kaelea Gathright, Jackquline Denmark, MD   650 mg at 04/09/20 1225  . alum & mag hydroxide-simeth (MAALOX/MYLANTA) 200-200-20 MG/5ML suspension 30 mL  30 mL Oral Q4H PRN Jericca Russett T, MD      . benztropine (COGENTIN) tablet 1 mg  1 mg Oral BID Karol Skarzynski, Jackquline Denmark, MD   1 mg at 04/11/20 0908  . haloperidol (HALDOL) tablet 15 mg  15 mg Oral QHS Jett Fukuda, Jackquline Denmark, MD   15 mg at 04/10/20 2131  . [START ON 04/12/2020] lithium carbonate (LITHOBID) CR tablet 300 mg  300 mg Oral Daily Zahriah Roes T, MD      . lithium carbonate (LITHOBID) CR tablet 600 mg  600 mg Oral QHS Kiaan Overholser T, MD      . magnesium hydroxide (MILK OF MAGNESIA) suspension 30 mL  30 mL Oral Daily PRN Hays Dunnigan, Jackquline Denmark, MD        Lab Results:  Results for orders placed or performed during the hospital encounter of 04/01/20 (from the past 48 hour(s))  Lithium level     Status: None   Collection Time: 04/11/20  8:50 AM  Result Value Ref Range   Lithium Lvl 0.64 0.60 - 1.20 mmol/L    Comment: Performed at [facility], [address] [place], [place] [address]    Blood Alcohol level:  Lab Results  Component Value Date   [facility] <10 03/28/2020    Metabolic Disorder Labs: Lab Results  Component Value Date   HGBA1C 5.4 04/03/2020   MPG 108 04/03/2020   No results found for: PROLACTIN Lab Results  Component Value Date   CHOL 206 (H) 04/03/2020   TRIG 91 04/03/2020   HDL 62 04/03/2020   CHOLHDL 3.3 04/03/2020   VLDL 18 04/03/2020   LDLCALC 126 (H) 04/03/2020   LDLCALC 107 (H) 03/26/2020    Physical Findings: AIMS:  , ,  ,  ,    CIWA:    COWS:     Musculoskeletal: Strength & Muscle Tone: within normal limits Gait & Station: normal Patient leans: N/A  Psychiatric Specialty Exam: Physical Exam Vitals and nursing note reviewed.  Constitutional:      Appearance: She is well-developed.  HENT:     Head: Normocephalic and atraumatic.  Eyes:     Conjunctiva/sclera: Conjunctivae normal.     Pupils: Pupils are equal, round,  and reactive to light.  Cardiovascular:     Heart sounds: Normal heart sounds.  Pulmonary:     Effort: Pulmonary effort is normal.  Abdominal:     Palpations: Abdomen is soft.  Musculoskeletal:        General: Normal range of motion.     Cervical back:  Normal range of motion.  Skin:    General: Skin is warm and dry.  Neurological:     General: No focal deficit present.     Mental Status: She is alert.  Psychiatric:        Attention and Perception: She is inattentive.        Mood and Affect: Mood is anxious. Affect is angry.        Speech: Speech is tangential.        Behavior: Behavior is agitated. Behavior is not aggressive.        Thought Content: Thought content is paranoid and delusional.        Cognition and Memory: Cognition is impaired. Memory is impaired.        Judgment: Judgment is inappropriate.     Review of Systems  Constitutional: Negative.   HENT: Negative.   Eyes: Negative.   Respiratory: Negative.   Cardiovascular: Negative.   Gastrointestinal: Negative.   Musculoskeletal: Negative.   Skin: Negative.   Neurological: Negative.   Psychiatric/Behavioral: Positive for dysphoric mood.    Blood pressure (!) 105/91, pulse (!) 102, temperature 97.6 F (36.4 C), temperature source Oral, resp. rate 17, height 5\' 3"  (1.6 m), weight 66.7 kg, SpO2 99 %.Body mass index is 26.04 kg/m.  General Appearance: Casual  Eye Contact:  Good  Speech:  Pressured  Volume:  Increased  Mood:  Anxious and Irritable  Affect:  Congruent  Thought Process:  Disorganized  Orientation:  Full (Time, Place, and Person)  Thought Content:  Illogical, Paranoid Ideation and Rumination  Suicidal Thoughts:  No  Homicidal Thoughts:  No  Memory:  Immediate;   Fair Recent;   Poor Remote;   Fair  Judgement:  Impaired  Insight:  Shallow  Psychomotor Activity:  Normal  Concentration:  Concentration: Poor  Recall:  Poor  Fund of Knowledge:  Fair  Language:  Fair  Akathisia:  No  Handed:   Right  AIMS (if indicated):     Assets:  Desire for Improvement Financial Resources/Insurance Housing Physical Health Resilience Social Support  ADL's:  Impaired  Cognition:  Impaired,  Mild  Sleep:  Number of Hours: 6.75     Treatment Plan Summary: Daily contact with patient to assess and evaluate symptoms and progress in treatment, Medication management and Plan Increased dose of lithium to 900 a day.  I suggested to her that we increase Haldol as well but she is strongly opposed to that.  Encourage continued group attendance.  Monitor mood and behavior day-to-day but at this point she remains very paranoid and disorganized.  , MD 04/11/2020, 12:37 PM

## 2020-04-11 NOTE — [facility] Group Notes (Signed)
Emotional Regulation 04/11/2020 1PM  Type of Therapy/Topic:  Group Therapy:  Emotion Regulation  Participation Level:  Did Not Attend   Description of Group:   The purpose of this group is to assist patients in learning to regulate negative emotions and experience positive emotions. Patients will be guided to discuss ways in which they have been vulnerable to their negative emotions. These vulnerabilities will be juxtaposed with experiences of positive emotions or situations, and patients will be challenged to use positive emotions to combat negative ones. Special emphasis will be placed on coping with negative emotions in conflict situations, and patients will process healthy conflict resolution skills.  Therapeutic Goals: 1. Patient will identify two positive emotions or experiences to reflect on in order to balance out negative emotions 2. Patient will label two or more emotions that they find the most difficult to experience 3. Patient will demonstrate positive conflict resolution skills through discussion and/or role plays  Summary of Patient Progress:       Therapeutic Modalities:   Cognitive Behavioral Therapy Feelings Identification Dialectical Behavioral Therapy   Suzan Slick, LCSW 04/11/2020 2:23 PM

## 2020-04-11 NOTE — Progress Notes (Signed)
Recreation Therapy Notes  Date: 04/11/2020  Time: 9:30 am  Location: Courtyard   Behavioral response: Appropriate  Intervention Topic: Emotions     Discussion/Intervention:  Group content on today was focused on emotions. The group identified what emotions are and why it is important to have emotions. Patients expressed some positive and negative emotions. Individuals gave some past experiences on how they normally dealt with emotions in the past. The group described some positive ways to deal with emotions in the future. Patients participated in the intervention "Name the [name]" where individuals were given a chance to experience different emotions.  Clinical Observations/Feedback:  Patient came to group and was actively social with peers and staff while participating in the group intervention. Individual left group early expressing the medication made her sleepy and she needed to go rest.  Harmoney Sienkiewicz LRT/CTRS         Anoop Hemmer 04/11/2020 11:21 AM

## 2020-04-12 NOTE — Progress Notes (Signed)
Patient alert and oriented no distress noted thoughts are organized and coherent, speech is soft non tangential, she was noted interacting appropriately with peers and staff, 15 minutes safety checks maintained will continue to monitor, 15 minutes safety checks maintained will continue to monitor. Marland Kitchen

## 2020-04-12 NOTE — Plan of Care (Signed)
Patient Denies SI / HI / AVH. Patient continues to be paranoid of hospitalization. Patient is seen in milieu during meals. Patient is without complaint. Patient has refused oral medications this morning. Patient's safety is maintained with q15 safety rounds.    Problem: Education: Goal: Ability to make informed decisions regarding treatment will improve Outcome: Progressing   Problem: Coping: Goal: Coping ability will improve Outcome: Progressing   Problem: Health Behavior/Discharge Planning: Goal: Identification of resources available to assist in meeting health care needs will improve Outcome: Progressing   Problem: Medication: Goal: Compliance with prescribed medication regimen will improve Outcome: Progressing

## 2020-04-12 NOTE — Progress Notes (Signed)
[facility] MD Progress Note   04/12/2020 11:24 AM Tanya Simpson  MRN:  [id number] Subjective: Patient seen chart reviewed.  Patient continues to be agitated hyperverbal intrusive demanding and displaying poor insight.  Once again request discharge.  Her reasons for it are "I have so many problems to work on".  When I asked her to review those she started talking about her father.  All of the so-called "problems" are complaints about his past behavior.  None of it is actually anything that is currently happening to her.  Same goes for every other so-called problem.  Tried to get her to cognitively think through this but she remains very agitated and pressured.  Refused her lithium this morning because she says that the medicine last night made her oversedated.  Contradicting that is that she has been up since first thing of this morning hyperactive hyperverbal clearly not oversedated. Principal Problem: Bipolar affective disorder, current episode manic with psychotic symptoms (HCC) Diagnosis: Principal Problem:   Bipolar affective disorder, current episode manic with psychotic symptoms (HCC) Active Problems:   Acute psychosis (HCC)  Total Time spent with patient: 30 minutes  Past Psychiatric History: Past history of bipolar disorder with psychotic symptoms and suicide attempts  Past Medical History:  Past Medical History:  Diagnosis Date  . Allergic rhinitis   . Anxiety   . Depression   . Schizophrenia [facility])     Past Surgical History:  Procedure Laterality Date  . TONSILLECTOMY     Family History:  Family History  Problem Relation Age of Onset  . Hypertension Mother   . Impulse control disorder Mother   . CVA Father   . Depression Father    Family Psychiatric  History: See previous Social History:  Social History   Substance and Sexual Activity  Alcohol Use Yes  . Alcohol/week: 1.0 standard drink  . Types: 1 Glasses of wine per week   Comment: very little     Social History    Substance and Sexual Activity  Drug Use No    Social History   Socioeconomic History  . Marital status: Married    Spouse name: [name]  . Number of children: 2  . Years of education: 16  . Highest education level: Bachelor's degree (e.g., BA, AB, BS)  Occupational History  . Occupation: [employer]  Tobacco Use  . Smoking status: Former Smoker    Packs/day: 1.00    Years: 1.00    Pack years: 1.00  . Smokeless tobacco: Never Used  Vaping Use  . Vaping Use: Never used  Substance and Sexual Activity  . Alcohol use: Yes    Alcohol/week: 1.0 standard drink    Types: 1 Glasses of wine per week    Comment: very little  . Drug use: No  . Sexual activity: Yes    Partners: Male  Other Topics Concern  . Not on file  Social History Narrative   Patient lives with her husband here in [place], his name is [name].    Patient works as a Holiday representative at [employer].    Patient is recently from [place].       Hopeful to get back to exercising once the weather cools down.    She was participating in [employer], however the pandemic has made this difficult for her.    Social Determinants of Health   Financial Resource Strain: Low Risk   . Difficulty of Paying Living Expenses: Not hard at all  Food Insecurity: Food  Insecurity Present  . Worried About Programme researcher, broadcasting/film/videounning Out of Food in the Last Year: Sometimes true  . Ran Out of Food in the Last Year: Sometimes true  Transportation Needs: No Transportation Needs  . Lack of Transportation (Medical): No  . Lack of Transportation (Non-Medical): No  Physical Activity: Insufficiently Active  . Days of Exercise per Week: 7 days  . Minutes of Exercise per Session: 10 min  Stress: Stress Concern Present  . Feeling of Stress : To some extent  Social Connections: Moderately Isolated  . Frequency of Communication with Friends and Family: Once a week  . Frequency of Social Gatherings with Friends and Family: Never  . Attends Religious Services: More  than 4 times per year  . Active Member of Clubs or Organizations: No  . Attends BankerClub or Organization Meetings: Never  . Marital Status: Married   Additional Social History:                         Sleep: Fair  Appetite:  Negative  Current Medications: Current Facility-Administered Medications  Medication Dose Route Frequency Provider Last Rate Last Admin  . acetaminophen (TYLENOL) tablet 650 mg  650 mg Oral Q6H PRN Julizza Sassone, Jackquline DenmarkJohn T, MD   650 mg at 04/09/20 1225  . alum & mag hydroxide-simeth (MAALOX/MYLANTA) 200-200-20 MG/5ML suspension 30 mL  30 mL Oral Q4H PRN Jhada Risk T, MD      . benztropine (COGENTIN) tablet 1 mg  1 mg Oral BID Ryheem Jay T, MD   1 mg at 04/11/20 1701  . haloperidol (HALDOL) tablet 15 mg  15 mg Oral QHS Ellyse Rotolo, Jackquline DenmarkJohn T, MD   15 mg at 04/11/20 2113  . lithium carbonate (LITHOBID) CR tablet 300 mg  300 mg Oral Daily Timoty Bourke T, MD      . lithium carbonate (LITHOBID) CR tablet 600 mg  600 mg Oral QHS Olivya Sobol T, MD   600 mg at 04/11/20 2113  . magnesium hydroxide (MILK OF MAGNESIA) suspension 30 mL  30 mL Oral Daily PRN Tyreese Thain, Jackquline DenmarkJohn T, MD        Lab Results:  Results for orders placed or performed during the hospital encounter of 04/01/20 (from the past 48 hour(s))  Lithium level     Status: None   Collection Time: 04/11/20  8:50 AM  Result Value Ref Range   Lithium Lvl 0.64 0.60 - 1.20 mmol/L    Comment: Performed at [facility], [address] [place], [place] [address]    Blood Alcohol level:  Lab Results  Component Value Date   [facility] <10 03/28/2020    Metabolic Disorder Labs: Lab Results  Component Value Date   HGBA1C 5.4 04/03/2020   MPG 108 04/03/2020   No results found for: PROLACTIN Lab Results  Component Value Date   CHOL 206 (H) 04/03/2020   TRIG 91 04/03/2020   HDL 62 04/03/2020   CHOLHDL 3.3 04/03/2020   VLDL 18 04/03/2020   LDLCALC 126 (H) 04/03/2020   LDLCALC 107 (H) 03/26/2020    Physical  Findings: AIMS:  , ,  ,  ,    CIWA:    COWS:     Musculoskeletal: Strength & Muscle Tone: within normal limits Gait & Station: normal Patient leans: N/A  Psychiatric Specialty Exam: Physical Exam Vitals and nursing note reviewed.  Constitutional:      Appearance: She is well-developed.  HENT:     Head: Normocephalic and atraumatic.  Eyes:     Conjunctiva/sclera: Conjunctivae normal.     Pupils: Pupils are equal, round, and reactive to light.  Cardiovascular:     Heart sounds: Normal heart sounds.  Pulmonary:     Effort: Pulmonary effort is normal.  Abdominal:     Palpations: Abdomen is soft.  Musculoskeletal:        General: Normal range of motion.     Cervical back: Normal range of motion.  Skin:    General: Skin is warm and dry.  Neurological:     General: No focal deficit present.     Mental Status: She is alert.  Psychiatric:        Attention and Perception: She is inattentive.        Mood and Affect: Mood is anxious. Affect is labile.        Speech: Speech is rapid and pressured and tangential.        Behavior: Behavior is agitated.        Thought Content: Thought content is paranoid and delusional.        Cognition and Memory: Cognition is impaired. Memory is impaired.        Judgment: Judgment is impulsive.     Review of Systems  Constitutional: Negative.   HENT: Negative.   Eyes: Negative.   Respiratory: Negative.   Cardiovascular: Negative.   Gastrointestinal: Negative.   Musculoskeletal: Negative.   Skin: Negative.   Neurological: Negative.   Psychiatric/Behavioral: Positive for agitation, confusion and sleep disturbance. The patient is nervous/anxious and is hyperactive.     Blood pressure (!) 105/91, pulse (!) 102, temperature 97.6 F (36.4 C), temperature source Oral, resp. rate 17, height 5\' 3"  (1.6 m), weight 66.7 kg, SpO2 99 %.Body mass index is 26.04 kg/m.  General Appearance: Disheveled  Eye Contact:  Good  Speech:  Pressured  Volume:   Increased  Mood:  Anxious and Euphoric  Affect:  Congruent and Labile  Thought Process:  Disorganized  Orientation:  Full (Time, Place, and Person)  Thought Content:  Illogical, Delusions, Paranoid Ideation, Rumination and Tangential  Suicidal Thoughts:  Yes.  without intent/plan  Homicidal Thoughts:  No  Memory:  Immediate;   Poor Recent;   Poor Remote;   Poor  Judgement:  Impaired  Insight:  Shallow  Psychomotor Activity:  Restlessness  Concentration:  Concentration: Poor  Recall:  Poor  Fund of Knowledge:  Fair  Language:  Fair  Akathisia:  No  Handed:  Right  AIMS (if indicated):     Assets:  Desire for Improvement Housing Physical Health Resilience Social Support  ADL's:  Impaired  Cognition:  Impaired,  Mild  Sleep:  Number of Hours: 7     Treatment Plan Summary: Daily contact with patient to assess and evaluate symptoms and progress in treatment, Medication management and Plan Once again I explained the rationale for treatment.  She continues to be manic with agitation anxiety poor insight and high risk of suicide.  Implored the patient to please take her lithium this morning.  She agrees to do so.  We discussed the antipsychotic medicine.  She has a good point that it has been a couple weeks on the 15 mg of Haldol without clear improvement.  1 option would be to try changing to a different medicine.  She is opposed to this because she gets so anxious about changing medicines.  I advised her that until she gets well however it is just dragging out the hospitalization.  May  have some partial ability to think through that.  Her husband called and wants me to call back so I will do so today.  Mordecai Rasmussen, MD 04/12/2020, 11:24 AM

## 2020-04-12 NOTE — [facility] Group Notes (Signed)
LCSW Group Therapy  04/12/20 1:40pm- 2:25 PM   Type of Therapy and Topic:  Group Therapy:  Setting Goals   Participation Level:  Active   Description of Group: In this process group, patients discussed using strengths to work toward goals and address challenges.  Patients identified two positive things about themselves and one goal they were working on.  Patients were given the opportunity to share openly and support each other's plan for self-empowerment.  The group discussed the value of gratitude and were encouraged to have a daily reflection of positive characteristics or circumstances.  Patients were encouraged to identify a plan to utilize their strengths to work on current challenges and goals.   Therapeutic Goals 1. Patient will verbalize personal strengths/positive qualities and relate how these can assist with achieving desired personal goals 2. Patients will verbalize affirmation of peers plans for personal change and goal setting 3. Patients will explore the value of gratitude and positive focus as related to successful achievement of goals 4. Patients will verbalize a plan for regular reinforcement of personal positive qualities and circumstances.   Summary of Patient Progress: Patient identified the definition of goals. Patients was given the opportunity to share openly and support other group members. Patient was able to identify an achievable goal upon discharge. Patient spoke about obstacles and barriers that prevent her from achieving her goals in the past and future. Patient expressed being dizzy during group and put her head down at times. Patient left group early.      Therapeutic Modalities Cognitive Behavioral Therapy Motivational Interviewing

## 2020-04-13 MED ORDER — HALOPERIDOL 5 MG PO TABS
20.0000 mg | ORAL_TABLET | Freq: Every day | ORAL | Status: DC
  Administered 2020-04-13 – 2020-04-19 (×7): 20 mg via ORAL
  Filled 2020-04-13 (×7): qty 4

## 2020-04-13 NOTE — Progress Notes (Signed)
[facility] MD Progress Note  04/13/2020 12:55 PM Tanya Simpson  MRN:  [id number] Subjective: Follow-up for this 59 year old woman with bipolar disorder.  Patient continues to be hyperverbal agitated paranoid and confused.  Focuses only on wanting to be discharged.  During interview she becomes derailed frequently talking about paranoia she has about her situation at home and things that have happened in the past.  Argumentative about her medicine.  Continues to report feeling sedated despite being objectively hyperactive in her behavior. Principal Problem: Bipolar affective disorder, current episode manic with psychotic symptoms (HCC) Diagnosis: Principal Problem:   Bipolar affective disorder, current episode manic with psychotic symptoms (HCC) Active Problems:   Acute psychosis (HCC)  Total Time spent with patient: 30 minutes  Past Psychiatric History: Past history of bipolar disorder several prior admissions.  Past Medical History:  Past Medical History:  Diagnosis Date  . Allergic rhinitis   . Anxiety   . Depression   . Schizophrenia [facility])     Past Surgical History:  Procedure Laterality Date  . TONSILLECTOMY     Family History:  Family History  Problem Relation Age of Onset  . Hypertension Mother   . Impulse control disorder Mother   . CVA Father   . Depression Father    Family Psychiatric  History: See previous Social History:  Social History   Substance and Sexual Activity  Alcohol Use Yes  . Alcohol/week: 1.0 standard drink  . Types: 1 Glasses of wine per week   Comment: very little     Social History   Substance and Sexual Activity  Drug Use No    Social History   Socioeconomic History  . Marital status: Married    Spouse name: [name]  . Number of children: 2  . Years of education: 16  . Highest education level: Bachelor's degree (e.g., BA, AB, BS)  Occupational History  . Occupation: [employer]  Tobacco Use  . Smoking status: Former Smoker    Packs/day:  1.00    Years: 1.00    Pack years: 1.00  . Smokeless tobacco: Never Used  Vaping Use  . Vaping Use: Never used  Substance and Sexual Activity  . Alcohol use: Yes    Alcohol/week: 1.0 standard drink    Types: 1 Glasses of wine per week    Comment: very little  . Drug use: No  . Sexual activity: Yes    Partners: Male  Other Topics Concern  . Not on file  Social History Narrative   Patient lives with her husband here in [place], his name is [name].    Patient works as a Holiday representative at [employer].    Patient is recently from [place].       Hopeful to get back to exercising once the weather cools down.    She was participating in [employer], however the pandemic has made this difficult for her.    Social Determinants of Health   Financial Resource Strain: Low Risk   . Difficulty of Paying Living Expenses: Not hard at all  Food Insecurity: Food Insecurity Present  . Worried About Programme researcher, broadcasting/film/video in the Last Year: Sometimes true  . Ran Out of Food in the Last Year: Sometimes true  Transportation Needs: No Transportation Needs  . Lack of Transportation (Medical): No  . Lack of Transportation (Non-Medical): No  Physical Activity: Insufficiently Active  . Days of Exercise per Week: 7 days  . Minutes of Exercise per Session: 10 min  Stress: Stress Concern Present  . Feeling of Stress : To some extent  Social Connections: Moderately Isolated  . Frequency of Communication with Friends and Family: Once a week  . Frequency of Social Gatherings with Friends and Family: Never  . Attends Religious Services: More than 4 times per year  . Active Member of Clubs or Organizations: No  . Attends Banker Meetings: Never  . Marital Status: Married   Additional Social History:                         Sleep: Fair  Appetite:  Fair  Current Medications: Current Facility-Administered Medications  Medication Dose Route Frequency Provider Last Rate Last Admin   . acetaminophen (TYLENOL) tablet 650 mg  650 mg Oral Q6H PRN Yossi Hinchman, Jackquline Denmark, MD   650 mg at 04/09/20 1225  . alum & mag hydroxide-simeth (MAALOX/MYLANTA) 200-200-20 MG/5ML suspension 30 mL  30 mL Oral Q4H PRN Aqib Lough T, MD      . benztropine (COGENTIN) tablet 1 mg  1 mg Oral BID Harry Bark T, MD   1 mg at 04/13/20 0810  . haloperidol (HALDOL) tablet 20 mg  20 mg Oral QHS Mishayla Sliwinski T, MD      . lithium carbonate (LITHOBID) CR tablet 300 mg  300 mg Oral Daily Chancey Ringel, Jackquline Denmark, MD   300 mg at 04/13/20 0810  . lithium carbonate (LITHOBID) CR tablet 600 mg  600 mg Oral QHS Novelle Addair T, MD   600 mg at 04/12/20 2119  . magnesium hydroxide (MILK OF MAGNESIA) suspension 30 mL  30 mL Oral Daily PRN Shiri Hodapp, Jackquline Denmark, MD        Lab Results: No results found for this or any previous visit (from the past 48 hour(s)).  Blood Alcohol level:  Lab Results  Component Value Date   ETH <10 03/28/2020    Metabolic Disorder Labs: Lab Results  Component Value Date   HGBA1C 5.4 04/03/2020   MPG 108 04/03/2020   No results found for: PROLACTIN Lab Results  Component Value Date   CHOL 206 (H) 04/03/2020   TRIG 91 04/03/2020   HDL 62 04/03/2020   CHOLHDL 3.3 04/03/2020   VLDL 18 04/03/2020   LDLCALC 126 (H) 04/03/2020   LDLCALC 107 (H) 03/26/2020    Physical Findings: AIMS:  , ,  ,  ,    CIWA:    COWS:     Musculoskeletal: Strength & Muscle Tone: within normal limits Gait & Station: normal Patient leans: N/A  Psychiatric Specialty Exam: Physical Exam Vitals and nursing note reviewed.  Constitutional:      Appearance: She is well-developed.  HENT:     Head: Normocephalic and atraumatic.  Eyes:     Conjunctiva/sclera: Conjunctivae normal.     Pupils: Pupils are equal, round, and reactive to light.  Cardiovascular:     Heart sounds: Normal heart sounds.  Pulmonary:     Effort: Pulmonary effort is normal.  Abdominal:     Palpations: Abdomen is soft.  Musculoskeletal:         General: Normal range of motion.     Cervical back: Normal range of motion.  Skin:    General: Skin is warm and dry.  Neurological:     General: No focal deficit present.     Mental Status: She is alert.  Psychiatric:        Attention and Perception: She is inattentive.  Mood and Affect: Mood is anxious. Affect is labile.        Speech: Speech is tangential.        Behavior: Behavior is agitated. Behavior is not aggressive.        Thought Content: Thought content is paranoid and delusional.        Cognition and Memory: Cognition is impaired.        Judgment: Judgment is impulsive.     Review of Systems  Constitutional: Negative.   HENT: Negative.   Eyes: Negative.   Respiratory: Negative.   Cardiovascular: Negative.   Gastrointestinal: Negative.   Musculoskeletal: Negative.   Skin: Negative.   Neurological: Negative.   Psychiatric/Behavioral: Positive for confusion, decreased concentration and dysphoric mood.    Blood pressure 116/64, pulse 88, temperature 97.9 F (36.6 C), temperature source Oral, resp. rate 17, height 5\' 3"  (1.6 m), weight 66.7 kg, SpO2 95 %.Body mass index is 26.04 kg/m.  General Appearance: Casual  Eye Contact:  Fair  Speech:  Pressured  Volume:  Increased  Mood:  Angry, Anxious and Irritable  Affect:  Inappropriate and Labile  Thought Process:  Disorganized  Orientation:  Full (Time, Place, and Person)  Thought Content:  Illogical, Paranoid Ideation and Rumination  Suicidal Thoughts:  No  Homicidal Thoughts:  No  Memory:  Immediate;   Fair Recent;   Fair Remote;   Fair  Judgement:  Impaired  Insight:  Shallow  Psychomotor Activity:  Restlessness  Concentration:  Concentration: Poor  Recall:  of Knowledge:  Fair  Language:  Fair  Akathisia:  No  Handed:  Right  AIMS (if indicated):     Assets:  Housing Social Support  ADL's:  Impaired  Cognition:  Impaired,  Mild  Sleep:  Number of Hours: 7.5     Treatment Plan  Summary: Daily contact with patient to assess and evaluate symptoms and progress in treatment, Medication management and Plan Patient showing little improvement continues to be agitated disorganized paranoid and emotionally labile.  Taking her medicine.  We discussed possible options including changing to a different antipsychotic.  It seems easier at this point to increase the Haldol as she has not shown any akathisia or extrapyramidal symptoms.  Increase Haldol dose to 20 mg a day.  Continue to try and reassure her and encouraged her that things will gradually get better.  [employer], MD 04/13/2020, 12:55 PM

## 2020-04-13 NOTE — [facility] Group Notes (Signed)
[facility] Group Notes: (Clinical Social Work)   04/13/2020      Type of Therapy:  Group Therapy   Participation Level:  Did Not Attend despite MHT prompting   Shellia Cleverly, LCSW  04/13/2020 2:51 PM

## 2020-04-13 NOTE — Tx Team (Signed)
Interdisciplinary Treatment and Diagnostic Plan Update  04/13/2020 Time of Session: 12:25 PM Tanya Simpson MRN: [id number]  Principal Diagnosis: Bipolar affective disorder, current episode manic with psychotic symptoms (HCC)  Secondary Diagnoses: Principal Problem:   Bipolar affective disorder, current episode manic with psychotic symptoms (HCC) Active Problems:   Acute psychosis (HCC)   Current Medications:  Current Facility-Administered Medications  Medication Dose Route Frequency Provider Last Rate Last Admin  . acetaminophen (TYLENOL) tablet 650 mg  650 mg Oral Q6H PRN Clapacs, Jackquline Denmark, MD   650 mg at 04/09/20 1225  . alum & mag hydroxide-simeth (MAALOX/MYLANTA) 200-200-20 MG/5ML suspension 30 mL  30 mL Oral Q4H PRN Clapacs, John T, MD      . benztropine (COGENTIN) tablet 1 mg  1 mg Oral BID Clapacs, John T, MD   1 mg at 04/13/20 0810  . haloperidol (HALDOL) tablet 20 mg  20 mg Oral QHS Clapacs, John T, MD      . lithium carbonate (LITHOBID) CR tablet 300 mg  300 mg Oral Daily Clapacs, Jackquline Denmark, MD   300 mg at 04/13/20 0810  . lithium carbonate (LITHOBID) CR tablet 600 mg  600 mg Oral QHS Clapacs, John T, MD   600 mg at 04/12/20 2119  . magnesium hydroxide (MILK OF MAGNESIA) suspension 30 mL  30 mL Oral Daily PRN Clapacs, Jackquline Denmark, MD       PTA Medications: Medications Prior to Admission  Medication Sig Dispense Refill Last Dose  . loratadine (CLARITIN) 10 MG tablet Take 1 tablet (10 mg total) by mouth daily. 90 tablet 2   . Multiple Vitamin (MULTIVITAMIN) tablet Take 1 tablet by mouth every other day.       Patient Stressors:    Patient Strengths:    Treatment Modalities: Medication Management, Group therapy, Case management,  1 to 1 session with clinician, Psychoeducation, Recreational therapy.   Physician Treatment Plan for Primary Diagnosis: Bipolar affective disorder, current episode manic with psychotic symptoms (HCC) Long Term Goal(s): Improvement in symptoms so as ready  for discharge Improvement in symptoms so as ready for discharge   Short Term Goals: Ability to disclose and discuss suicidal ideas Ability to demonstrate self-control will improve Ability to identify and develop effective coping behaviors will improve Ability to maintain clinical measurements within normal limits will improve Compliance with prescribed medications will improve  Medication Management: Evaluate patient's response, side effects, and tolerance of medication regimen.  Therapeutic Interventions: 1 to 1 sessions, Unit Group sessions and Medication administration.  Evaluation of Outcomes: Not Progressing  Physician Treatment Plan for Secondary Diagnosis: Principal Problem:   Bipolar affective disorder, current episode manic with psychotic symptoms (HCC) Active Problems:   Acute psychosis (HCC)  Long Term Goal(s): Improvement in symptoms so as ready for discharge Improvement in symptoms so as ready for discharge   Short Term Goals: Ability to disclose and discuss suicidal ideas Ability to demonstrate self-control will improve Ability to identify and develop effective coping behaviors will improve Ability to maintain clinical measurements within normal limits will improve Compliance with prescribed medications will improve     Medication Management: Evaluate patient's response, side effects, and tolerance of medication regimen.  Therapeutic Interventions: 1 to 1 sessions, Unit Group sessions and Medication administration.  Evaluation of Outcomes: Not Progressing   RN Treatment Plan for Primary Diagnosis: Bipolar affective disorder, current episode manic with psychotic symptoms (HCC) Long Term Goal(s): Knowledge of disease and therapeutic regimen to maintain health will improve  Short Term Goals: Ability  to verbalize frustration and anger appropriately will improve, Ability to demonstrate self-control, Ability to verbalize feelings will improve, Ability to disclose and  discuss suicidal ideas, Ability to identify and develop effective coping behaviors will improve and Compliance with prescribed medications will improve  Medication Management: RN will administer medications as ordered by provider, will assess and evaluate patient's response and provide education to patient for prescribed medication. RN will report any adverse and/or side effects to prescribing provider.  Therapeutic Interventions: 1 on 1 counseling sessions, Psychoeducation, Medication administration, Evaluate responses to treatment, Monitor vital signs and CBGs as ordered, Perform/monitor CIWA, COWS, AIMS and Fall Risk screenings as ordered, Perform wound care treatments as ordered.  Evaluation of Outcomes: Not Progressing   LCSW Treatment Plan for Primary Diagnosis: Bipolar affective disorder, current episode manic with psychotic symptoms (HCC) Long Term Goal(s): Safe transition to appropriate next level of care at discharge, Engage patient in therapeutic group addressing interpersonal concerns.  Short Term Goals: Engage patient in aftercare planning with referrals and resources, Increase social support, Increase ability to appropriately verbalize feelings, Increase emotional regulation, Identify triggers associated with mental health/substance abuse issues and Increase skills for wellness and recovery  Therapeutic Interventions: Assess for all discharge needs, 1 to 1 time with Social worker, Explore available resources and support systems, Assess for adequacy in community support network, Educate family and significant other(s) on suicide prevention, Complete Psychosocial Assessment, Interpersonal group therapy.  Evaluation of Outcomes: Not Progressing   Progress in Treatment: Attending groups: Yes. Participating in groups: Yes. Taking medication as prescribed: Yes. Toleration medication: Yes. and No. Family/Significant other contact made: Yes, individual(s) contacted:  Patient declined  SPE Patient understands diagnosis: Yes. Discussing patient identified problems/goals with staff: Yes. Medical problems stabilized or resolved: No. Denies suicidal/homicidal ideation: No. Issues/concerns per patient self-inventory: No. Other:   New problem(s) identified: No, Describe:  None  New Short Term/Long Term Goal(s): Detox, elimination of psychosis and suicidal thoughts, medication management, discharge plan   Patient Goals:  Lose weight, have more energy, attend marriage counseling, and get out of treatment facility.     Discharge Plan or Barriers: Patient reports returning home and [facility]. Patient continues to be manic  Reason for Continuation of Hospitalization: Anxiety Depression Medication stabilization Suicidal ideation  Estimated Length of Stay: TBD  Attendees: Patient: Tanya Simpson 04/13/2020 3:04 PM  Physician: Mordecai Rasmussen, MD 04/13/2020 3:04 PM  Nursing: Maryelizabeth Kaufmann RN 04/13/2020 3:04 PM  RN Care Manager: 04/13/2020 3:04 PM  Social Worker: Susa Simmonds, LCSWA 04/13/2020 3:04 PM  Recreational Therapist:  04/13/2020 3:04 PM  Other:  04/13/2020 3:04 PM  Other:  04/13/2020 3:04 PM  Other: 04/13/2020 3:04 PM    Scribe for Treatment Team: Susa Simmonds, LCSWA 04/13/2020 3:04 PM

## 2020-04-13 NOTE — Plan of Care (Signed)
Patient stayed in bed most of the shift.Patient continues to be paranoid about being here states " I am in trouble.I won't be going home."Patient agreed that she is thinking too much.Denies SI,HI and AVH.Compliant with medications.Appetite and energy level good.Did not attend group.Support and encouragement given.

## 2020-04-13 NOTE — Progress Notes (Signed)
Patient complaining about still being in the hospital. Feels like she has no one to turn to. She says her husband is a cop and she does not know if her marriage is beneficial to her because she tried to commit suicide twice before. She then stated "my cousin is a Land. Maybe she can get me out of here". Still somewhat loose and tangential speech. Denies SI and contracts for safety

## 2020-04-13 NOTE — Plan of Care (Signed)
  Problem: Education: Goal: Ability to make informed decisions regarding treatment will improve Outcome: Progressing   Problem: Coping: Goal: Coping ability will improve Outcome: Progressing   Problem: Health Behavior/Discharge Planning: Goal: Identification of resources available to assist in meeting health care needs will improve Outcome: Progressing   Problem: Medication: Goal: Compliance with prescribed medication regimen will improve Outcome: Progressing   

## 2020-04-14 DIAGNOSIS — F312 Bipolar disorder, current episode manic severe with psychotic features: Principal | ICD-10-CM

## 2020-04-14 NOTE — Plan of Care (Signed)
  Problem: Education: Goal: Ability to make informed decisions regarding treatment will improve Outcome: Progressing   Problem: Coping: Goal: Coping ability will improve Outcome: Progressing   Problem: Health Behavior/Discharge Planning: Goal: Identification of resources available to assist in meeting health care needs will improve Outcome: Progressing   Problem: Medication: Goal: Compliance with prescribed medication regimen will improve Outcome: Progressing   

## 2020-04-14 NOTE — Progress Notes (Signed)
Patient was pleasant during assessment denying SI/HI/AVH. Patient presents with paranoia about her medications, patient given education. Patient compliant with medication administration per MD orders. Patient denies anxiety, depression and pain with this Clinical research associate. Patient given education, support and encouragement to be active in her treatment plan. Patient observed interacting appropriately with staff and peers. Patient being monitored Q 15 minutes for safety. Pt remains safe on the unit.
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
## 2020-04-14 NOTE — Progress Notes (Signed)
[facility] MD Progress Note  04/14/2020 9:57 AM Tanya Simpson  MRN:  [id number] Subjective: Patient seen.  Follow-up for 59 year old woman with bipolar disorder with recent mixed manic psychosis.  Patient has no specific new complaints.  Continues to express paranoid ideas about being "blamed" for things.  Cannot articulate this any more clearly.  As is the case every day she is irritated and asking for discharge although unable to give a very convincing description of what will be helping her outpatient.  Outpatient situation remains very stressful.  Patient did have an increase in her medicine yesterday no sign of any immediate side effects or problems. Principal Problem: Bipolar affective disorder, current episode manic with psychotic symptoms (HCC) Diagnosis: Principal Problem:   Bipolar affective disorder, current episode manic with psychotic symptoms (HCC) Active Problems:   Acute psychosis (HCC)  Total Time spent with patient: 30 minutes  Past Psychiatric History: Longstanding chronic mental illness.  Some extended periods of clarity but also multiple hospitalizations.  Positive past history of suicide attempts  Past Medical History:  Past Medical History:  Diagnosis Date  . Allergic rhinitis   . Anxiety   . Depression   . Schizophrenia [facility])     Past Surgical History:  Procedure Laterality Date  . TONSILLECTOMY     Family History:  Family History  Problem Relation Age of Onset  . Hypertension Mother   . Impulse control disorder Mother   . CVA Father   . Depression Father    Family Psychiatric  History: See previous Social History:  Social History   Substance and Sexual Activity  Alcohol Use Yes  . Alcohol/week: 1.0 standard drink  . Types: 1 Glasses of wine per week   Comment: very little     Social History   Substance and Sexual Activity  Drug Use No    Social History   Socioeconomic History  . Marital status: Married    Spouse name: [name]  . Number of children:  2  . Years of education: 16  . Highest education level: Bachelor's degree (e.g., BA, AB, BS)  Occupational History  . Occupation: [employer]  Tobacco Use  . Smoking status: Former Smoker    Packs/day: 1.00    Years: 1.00    Pack years: 1.00  . Smokeless tobacco: Never Used  Vaping Use  . Vaping Use: Never used  Substance and Sexual Activity  . Alcohol use: Yes    Alcohol/week: 1.0 standard drink    Types: 1 Glasses of wine per week    Comment: very little  . Drug use: No  . Sexual activity: Yes    Partners: Male  Other Topics Concern  . Not on file  Social History Narrative   Patient lives with her husband here in [place], his name is [name].    Patient works as a Holiday representative at [employer].    Patient is recently from [place].       Hopeful to get back to exercising once the weather cools down.    She was participating in [employer], however the pandemic has made this difficult for her.    Social Determinants of Health   Financial Resource Strain: Low Risk   . Difficulty of Paying Living Expenses: Not hard at all  Food Insecurity: Food Insecurity Present  . Worried About Programme researcher, broadcasting/film/video in the Last Year: Sometimes true  . Ran Out of Food in the Last Year: Sometimes true  Transportation Needs: No Transportation Needs  .  Lack of Transportation (Medical): No  . Lack of Transportation (Non-Medical): No  Physical Activity: Insufficiently Active  . Days of Exercise per Week: 7 days  . Minutes of Exercise per Session: 10 min  Stress: Stress Concern Present  . Feeling of Stress : To some extent  Social Connections: Moderately Isolated  . Frequency of Communication with Friends and Family: Once a week  . Frequency of Social Gatherings with Friends and Family: Never  . Attends Religious Services: More than 4 times per year  . Active Member of Clubs or Organizations: No  . Attends Banker Meetings: Never  . Marital Status: Married   Additional Social  History:                         Sleep: Fair  Appetite:  Fair  Current Medications: Current Facility-Administered Medications  Medication Dose Route Frequency Provider Last Rate Last Admin  . acetaminophen (TYLENOL) tablet 650 mg  650 mg Oral Q6H PRN Aiana Nordquist, Jackquline Denmark, MD   650 mg at 04/09/20 1225  . alum & mag hydroxide-simeth (MAALOX/MYLANTA) 200-200-20 MG/5ML suspension 30 mL  30 mL Oral Q4H PRN Jequan Shahin T, MD      . benztropine (COGENTIN) tablet 1 mg  1 mg Oral BID Kayl Stogdill, Jackquline Denmark, MD   1 mg at 04/14/20 0814  . haloperidol (HALDOL) tablet 20 mg  20 mg Oral QHS Malana Eberwein, Jackquline Denmark, MD   20 mg at 04/13/20 2110  . lithium carbonate (LITHOBID) CR tablet 300 mg  300 mg Oral Daily Cherry Turlington, Jackquline Denmark, MD   300 mg at 04/14/20 0814  . lithium carbonate (LITHOBID) CR tablet 600 mg  600 mg Oral QHS Claudia Greenley T, MD   600 mg at 04/13/20 2111  . magnesium hydroxide (MILK OF MAGNESIA) suspension 30 mL  30 mL Oral Daily PRN Velora Horstman, Jackquline Denmark, MD        Lab Results: No results found for this or any previous visit (from the past 48 hour(s)).  Blood Alcohol level:  Lab Results  Component Value Date   ETH <10 03/28/2020    Metabolic Disorder Labs: Lab Results  Component Value Date   HGBA1C 5.4 04/03/2020   MPG 108 04/03/2020   No results found for: PROLACTIN Lab Results  Component Value Date   CHOL 206 (H) 04/03/2020   TRIG 91 04/03/2020   HDL 62 04/03/2020   CHOLHDL 3.3 04/03/2020   VLDL 18 04/03/2020   LDLCALC 126 (H) 04/03/2020   LDLCALC 107 (H) 03/26/2020    Physical Findings: AIMS:  , ,  ,  ,    CIWA:    COWS:     Musculoskeletal: Strength & Muscle Tone: within normal limits Gait & Station: normal Patient leans: N/A  Psychiatric Specialty Exam: Physical Exam Vitals and nursing note reviewed.  Constitutional:      Appearance: She is well-developed.  HENT:     Head: Normocephalic and atraumatic.  Eyes:     Conjunctiva/sclera: Conjunctivae normal.     Pupils:  Pupils are equal, round, and reactive to light.  Cardiovascular:     Heart sounds: Normal heart sounds.  Pulmonary:     Effort: Pulmonary effort is normal.  Abdominal:     Palpations: Abdomen is soft.  Musculoskeletal:        General: Normal range of motion.     Cervical back: Normal range of motion.  Skin:    General: Skin is warm  and dry.  Neurological:     General: No focal deficit present.     Mental Status: She is alert.  Psychiatric:        Attention and Perception: She is inattentive.        Mood and Affect: Mood is anxious.        Speech: Speech is delayed.        Behavior: Behavior is agitated. Behavior is not aggressive.        Thought Content: Thought content is paranoid and delusional. Thought content does not include homicidal or suicidal ideation.        Cognition and Memory: Memory is impaired.        Judgment: Judgment is impulsive.     Review of Systems  Constitutional: Negative.   HENT: Negative.   Eyes: Negative.   Respiratory: Negative.   Cardiovascular: Negative.   Gastrointestinal: Negative.   Musculoskeletal: Negative.   Skin: Negative.   Neurological: Negative.   Psychiatric/Behavioral: Positive for confusion. The patient is nervous/anxious.     Blood pressure (!) 103/54, pulse 86, temperature 98.1 F (36.7 C), temperature source Oral, resp. rate 18, height 5\' 3"  (1.6 m), weight 66.7 kg, SpO2 98 %.Body mass index is 26.04 kg/m.  General Appearance: Casual  Eye Contact:  Fair  Speech:  Clear and Coherent  Volume:  Decreased  Mood:  Anxious, Dysphoric and Irritable  Affect:  Congruent  Thought Process:  Disorganized  Orientation:  Full (Time, Place, and Person)  Thought Content:  Illogical and Paranoid Ideation  Suicidal Thoughts:  No  Homicidal Thoughts:  No  Memory:  Immediate;   Fair Recent;   Fair Remote;   Fair  Judgement:  Impaired  Insight:  Shallow  Psychomotor Activity:  Decreased  Concentration:  Concentration: Fair  Recall:   of Knowledge:  Fair  Language:  Fair  Akathisia:  No  Handed:  Right  AIMS (if indicated):     Assets:  Desire for Improvement Housing Physical Health Resilience  ADL's:  Impaired  Cognition:  Impaired,  Mild  Sleep:  Number of Hours: 8     Treatment Plan Summary: Daily contact with patient to assess and evaluate symptoms and progress in treatment, Medication management and Plan Supportive counseling.  Encourage patient to be thinking more realistically about her outpatient plan.  No change to medicine today.  We will probably be due to check a lithium level in another 1 to 2 days.  Praised patient for taking her medicine and encourage continued compliance as it does not seem to be any obvious side effects she is suffering.  Patient remains at high risk of suicidal behavior because of continued paranoid psychosis  [employer], MD 04/14/2020, 9:57 AM

## 2020-04-14 NOTE — Progress Notes (Signed)
Recreation Therapy Notes          Tanya Simpson 04/14/2020 12:06 PM 

## 2020-04-14 NOTE — [facility] Group Notes (Signed)
[facility] Group Notes:  (Nursing/MHT/Case Management/Adjunct)  Date:  04/14/2020  Time:  9:25 PM  Type of Therapy:  Group Therapy  Participation Level:  Active  Participation Quality:  Sharing  Affect:  Anxious  Cognitive:  Alert  Insight:  Good  Engagement in Group:  Engaged and get out to work on marriage.  Modes of Intervention:  Exploration  Summary of Progress/Problems:  Tanya Simpson 04/14/2020, 9:25 PM

## 2020-04-14 NOTE — Plan of Care (Signed)
Patient presents with paranoia about her medications, but is compliant with them.   Problem: Education: Goal: Emotional status will improve Outcome: Not Progressing Goal: Mental status will improve Outcome: Not Progressing

## 2020-04-14 NOTE — [facility] Group Notes (Signed)
LCSW Group Therapy Note   04/14/2020 2:29 PM  Type of Therapy and Topic:  Group Therapy:  Overcoming Obstacles   Participation Level:  Minimal   Description of Group:    In this group patients will be encouraged to explore what they see as obstacles to their own wellness and recovery. They will be guided to discuss their thoughts, feelings, and behaviors related to these obstacles. The group will process together ways to cope with barriers, with attention given to specific choices patients can make. Each patient will be challenged to identify changes they are motivated to make in order to overcome their obstacles. This group will be process-oriented, with patients participating in exploration of their own experiences as well as giving and receiving support and challenge from other group members.   Therapeutic Goals: 1. Patient will identify personal and current obstacles as they relate to admission. 2. Patient will identify barriers that currently interfere with their wellness or overcoming obstacles.  3. Patient will identify feelings, thought process and behaviors related to these barriers. 4. Patient will identify two changes they are willing to make to overcome these obstacles:      Summary of Patient Progress Patient was present in group.  Patient was an attentive listener and often stated "that's what happened to me" when CSW or others spoke in group about obstacles, though patient never led a statement and did not share without prompting.    Therapeutic Modalities:   Cognitive Behavioral Therapy Solution Focused Therapy Motivational Interviewing Relapse Prevention Therapy  Penni Homans, MSW, LCSW 04/14/2020 2:29 PM

## 2020-04-14 NOTE — Progress Notes (Signed)
Patient has been anxious and tearful at times. Complaining about not being discharged. Said the doctor told her he planned on keeping her here two to three more weeks. She continues to have some vague paranoid ideation about her husband. Speech is tangential. Thinks she is on too many medications but agrees to take them and then cries about the number of pills she has to take and said she may vomit because she is taking so many pills. Patient did not vomit. Up once during the night, complaining of sleeplessness and anxiety but refused prn medication. Patient got some water to drink and went back to bed. She was tearful and kept saying she was scared but was vague as to what she was afraid of.

## 2020-04-15 NOTE — Progress Notes (Signed)
°   04/15/20 1400  Clinical Encounter Type  Visited With Patient  Visit Type Follow-up;Spiritual support;Social support;Behavioral Health  Referral From Nurse  Consult/Referral To Chaplain  Pt came to Ch group. Today we talked about Being Afraid. Pt was engaged in group discussion. Ch will follow-up with Pt.

## 2020-04-15 NOTE — Progress Notes (Addendum)
Recreation Therapy Notes  Date: 04/15/2020  Time: 9:30 am  Location: Craft-room    Behavioral response: Appropriate  Intervention Topic: Self-esteem   Discussion/Intervention:  Group content today was focused on self-esteem. Patient defined self-esteem and where it comes form. The group described reasons self-esteem is important. Individuals stated things that impact self-esteem and positive ways to improve self-esteem. The group participated in the intervention "Collage of Me" where patients were able to create a collage of positive things that makes them who they are. Clinical Observations/Feedback:  Patient came to group and defined self-esteem as self-love and self-respect. She expressed that self-esteem comes from the mind, soul, and circumstances. Individual was social with peers and staff while participating in the intervention.     Myrian Botello LRT/CTRS         Humphrey Guerreiro 04/15/2020 12:42 PM

## 2020-04-15 NOTE — Progress Notes (Signed)
[facility] MD Progress Note  04/15/2020 1:41 PM Tanya Simpson  MRN:  [id number] Subjective: Follow-up for this 59 year old woman with bipolar disorder.  Patient seen chart reviewed.  Spoke with her husband as well.  Patient actually appeared to be in some way a little bit better today.  She was less agitated than usual although still a little intrusive.  She did not lose her temper with me and was able to hold to a line of thought better and while she still evidences paranoid thinking was not as agitated about it.  Appears to be taking her medicine.  I spoke with her husband who confirms that she continues to call pretty much daily asking him to argue to get her out of the hospital.  Husband continues to sound quite overwhelmed every time I talk with him Principal Problem: Bipolar affective disorder, current episode manic with psychotic symptoms (HCC) Diagnosis: Principal Problem:   Bipolar affective disorder, current episode manic with psychotic symptoms (HCC) Active Problems:   Acute psychosis (HCC)  Total Time spent with patient: 30 minutes  Past Psychiatric History: Past history of multiple hospitalizations chronic psychotic disorder  Past Medical History:  Past Medical History:  Diagnosis Date  . Allergic rhinitis   . Anxiety   . Depression   . Schizophrenia [facility])     Past Surgical History:  Procedure Laterality Date  . TONSILLECTOMY     Family History:  Family History  Problem Relation Age of Onset  . Hypertension Mother   . Impulse control disorder Mother   . CVA Father   . Depression Father    Family Psychiatric  History: See previous Social History:  Social History   Substance and Sexual Activity  Alcohol Use Yes  . Alcohol/week: 1.0 standard drink  . Types: 1 Glasses of wine per week   Comment: very little     Social History   Substance and Sexual Activity  Drug Use No    Social History   Socioeconomic History  . Marital status: Married    Spouse name: [name]  .  Number of children: 2  . Years of education: 16  . Highest education level: Bachelor's degree (e.g., BA, AB, BS)  Occupational History  . Occupation: [employer]  Tobacco Use  . Smoking status: Former Smoker    Packs/day: 1.00    Years: 1.00    Pack years: 1.00  . Smokeless tobacco: Never Used  Vaping Use  . Vaping Use: Never used  Substance and Sexual Activity  . Alcohol use: Yes    Alcohol/week: 1.0 standard drink    Types: 1 Glasses of wine per week    Comment: very little  . Drug use: No  . Sexual activity: Yes    Partners: Male  Other Topics Concern  . Not on file  Social History Narrative   Patient lives with her husband here in [place], his name is [name].    Patient works as a Holiday representative at [employer].    Patient is recently from [place].       Hopeful to get back to exercising once the weather cools down.    She was participating in [employer], however the pandemic has made this difficult for her.    Social Determinants of Health   Financial Resource Strain: Low Risk   . Difficulty of Paying Living Expenses: Not hard at all  Food Insecurity: Food Insecurity Present  . Worried About Programme researcher, broadcasting/film/video in the Last Year: Sometimes  true  . Ran Out of Food in the Last Year: Sometimes true  Transportation Needs: No Transportation Needs  . Lack of Transportation (Medical): No  . Lack of Transportation (Non-Medical): No  Physical Activity: Insufficiently Active  . Days of Exercise per Week: 7 days  . Minutes of Exercise per Session: 10 min  Stress: Stress Concern Present  . Feeling of Stress : To some extent  Social Connections: Moderately Isolated  . Frequency of Communication with Friends and Family: Once a week  . Frequency of Social Gatherings with Friends and Family: Never  . Attends Religious Services: More than 4 times per year  . Active Member of Clubs or Organizations: No  . Attends Banker Meetings: Never  . Marital Status: Married    Additional Social History:                         Sleep: Fair  Appetite:  Fair  Current Medications: Current Facility-Administered Medications  Medication Dose Route Frequency Provider Last Rate Last Admin  . acetaminophen (TYLENOL) tablet 650 mg  650 mg Oral Q6H PRN Jeannene Tschetter, Jackquline Denmark, MD   650 mg at 04/09/20 1225  . alum & mag hydroxide-simeth (MAALOX/MYLANTA) 200-200-20 MG/5ML suspension 30 mL  30 mL Oral Q4H PRN Xana Bradt T, MD      . benztropine (COGENTIN) tablet 1 mg  1 mg Oral BID Angelisse Riso, Jackquline Denmark, MD   1 mg at 04/15/20 0816  . haloperidol (HALDOL) tablet 20 mg  20 mg Oral QHS Kore Madlock, Jackquline Denmark, MD   20 mg at 04/14/20 2115  . lithium carbonate (LITHOBID) CR tablet 300 mg  300 mg Oral Daily Sachi Boulay, Jackquline Denmark, MD   300 mg at 04/15/20 0816  . lithium carbonate (LITHOBID) CR tablet 600 mg  600 mg Oral QHS Brave Dack T, MD   600 mg at 04/14/20 2115  . magnesium hydroxide (MILK OF MAGNESIA) suspension 30 mL  30 mL Oral Daily PRN Ibrahem Volkman, Jackquline Denmark, MD        Lab Results: No results found for this or any previous visit (from the past 48 hour(s)).  Blood Alcohol level:  Lab Results  Component Value Date   ETH <10 03/28/2020    Metabolic Disorder Labs: Lab Results  Component Value Date   HGBA1C 5.4 04/03/2020   MPG 108 04/03/2020   No results found for: PROLACTIN Lab Results  Component Value Date   CHOL 206 (H) 04/03/2020   TRIG 91 04/03/2020   HDL 62 04/03/2020   CHOLHDL 3.3 04/03/2020   VLDL 18 04/03/2020   LDLCALC 126 (H) 04/03/2020   LDLCALC 107 (H) 03/26/2020    Physical Findings: AIMS:  , ,  ,  ,    CIWA:    COWS:     Musculoskeletal: Strength & Muscle Tone: within normal limits Gait & Station: normal Patient leans: N/A  Psychiatric Specialty Exam: Physical Exam Vitals and nursing note reviewed.  Constitutional:      Appearance: She is well-developed.  HENT:     Head: Normocephalic and atraumatic.  Eyes:     Conjunctiva/sclera:  Conjunctivae normal.     Pupils: Pupils are equal, round, and reactive to light.  Cardiovascular:     Heart sounds: Normal heart sounds.  Pulmonary:     Effort: Pulmonary effort is normal.  Abdominal:     Palpations: Abdomen is soft.  Musculoskeletal:        General: Normal range  of motion.     Cervical back: Normal range of motion.  Skin:    General: Skin is warm and dry.  Neurological:     General: No focal deficit present.     Mental Status: She is alert.  Psychiatric:        Attention and Perception: Attention normal.        Mood and Affect: Mood is anxious.        Speech: Speech normal.        Behavior: Behavior is cooperative.        Thought Content: Thought content is paranoid.        Cognition and Memory: Cognition normal.        Judgment: Judgment is impulsive.     Review of Systems  Constitutional: Negative.   HENT: Negative.   Eyes: Negative.   Respiratory: Negative.   Cardiovascular: Negative.   Gastrointestinal: Negative.   Musculoskeletal: Negative.   Skin: Negative.   Neurological: Negative.   Psychiatric/Behavioral: Positive for confusion and dysphoric mood. The patient is nervous/anxious.     Blood pressure (!) 142/75, pulse 78, temperature 97.7 F (36.5 C), temperature source Oral, resp. rate 18, height 5\' 3"  (1.6 m), weight 66.7 kg, SpO2 100 %.Body mass index is 26.04 kg/m.  General Appearance: Casual  Eye Contact:  Good  Speech:  Clear and Coherent  Volume:  Normal  Mood:  Anxious  Affect:  Blunt  Thought Process:  Coherent  Orientation:  Full (Time, Place, and Person)  Thought Content:  Logical and Paranoid Ideation  Suicidal Thoughts:  No  Homicidal Thoughts:  No  Memory:  Immediate;   Fair Recent;   Fair Remote;   Fair  Judgement:  Impaired  Insight:  Shallow  Psychomotor Activity:  Normal  Concentration:  Concentration: Poor  Recall:  of Knowledge:  Fair  Language:  Fair  Akathisia:  No  Handed:  Right  AIMS (if  indicated):     Assets:  Communication Skills Desire for Improvement Financial Resources/Insurance Housing Social Support  ADL's:  Intact  Cognition:  WNL  Sleep:  Number of Hours: 6.25     Treatment Plan Summary: Daily contact with patient to assess and evaluate symptoms and progress in treatment, Medication management and Plan I am going to hope that she may be slightly better today although her insight is still limited and she is still overly focused on discharge rather than her actual illness.  Not setting any particular date yet but not changing her medicine.  [employer], MD 04/15/2020, 1:41 PM

## 2020-04-15 NOTE — [facility] Group Notes (Signed)
Feelings Around Relapse 04/15/2020 9:30AM/1PM  Type of Therapy and Topic:  Group Therapy:  Feelings around Relapse and Recovery  Participation Level:  Did Not Attend   Description of Group:    Patients in this group will discuss emotions they experience before and after a relapse. They will process how experiencing these feelings, or avoidance of experiencing them, relates to having a relapse. Facilitator will guide patients to explore emotions they have related to recovery. Patients will be encouraged to process which emotions are more powerful. They will be guided to discuss the emotional reaction significant others in their lives may have to patients' relapse or recovery. Patients will be assisted in exploring ways to respond to the emotions of others without this contributing to a relapse.  Therapeutic Goals: 1. Patient will identify two or more emotions that lead to a relapse for them 2. Patient will identify two emotions that result when they relapse 3. Patient will identify two emotions related to recovery 4. Patient will demonstrate ability to communicate their needs through discussion and/or role plays   Summary of Patient Progress:     Therapeutic Modalities:   Cognitive Behavioral Therapy Solution-Focused Therapy Assertiveness Training Relapse Prevention Therapy   Suzan Slick, LCSW 04/15/2020 2:38 PM

## 2020-04-15 NOTE — Plan of Care (Signed)
  Problem: Education: Goal: Ability to make informed decisions regarding treatment will improve Outcome: Progressing   Problem: Coping: Goal: Coping ability will improve Outcome: Progressing   Problem: Health Behavior/Discharge Planning: Goal: Identification of resources available to assist in meeting health care needs will improve Outcome: Progressing   Problem: Medication: Goal: Compliance with prescribed medication regimen will improve Outcome: Progressing   Problem: Self-Concept: Goal: Ability to disclose and discuss suicidal ideas will improve Outcome: Progressing Goal: Will verbalize positive feelings about self Outcome: Progressing   Problem: Education: Goal: Knowledge of [facility] Education information/materials will improve Outcome: Progressing Goal: Emotional status will improve Outcome: Progressing Goal: Mental status will improve Outcome: Progressing Goal: Verbalization of understanding the information provided will improve Outcome: Progressing   Problem: Activity: Goal: Interest or engagement in activities will improve Outcome: Progressing Goal: Sleeping patterns will improve Outcome: Progressing   Problem: Coping: Goal: Ability to verbalize frustrations and anger appropriately will improve Outcome: Progressing Goal: Ability to demonstrate self-control will improve Outcome: Progressing   Problem: Health Behavior/Discharge Planning: Goal: Identification of resources available to assist in meeting health care needs will improve Outcome: Progressing Goal: Compliance with treatment plan for underlying cause of condition will improve Outcome: Progressing   Problem: Physical Regulation: Goal: Ability to maintain clinical measurements within normal limits will improve Outcome: Progressing   Problem: Safety: Goal: Periods of time without injury will increase Outcome: Progressing  Patient denies SI, HI and AVH this shift.  Patient has been  pacing the unit, attending groups and compliant with medications.  Writer attempted to Recruitment consultant in 1:1 staff talks but patient declined.    Assess patient for safety, offer medications as prescribed, engage patient in 1:1 staff talks.   Patient able to contract for safety.  Continue to monitor per individualized treatment plan.

## 2020-04-16 NOTE — Plan of Care (Signed)
Patient presents with paranoia about medications but is compliant with them   Problem: Education: Goal: Emotional status will improve Outcome: Not Progressing Goal: Mental status will improve Outcome: Not Progressing

## 2020-04-16 NOTE — Progress Notes (Signed)
[facility] MD Progress Note  04/16/2020 3:40 PM Tanya Simpson  MRN:  [id number] Subjective: Follow-up for this 59 year old woman with bipolar disorder.  Patient seen again today and as usual is completely focused on discharge date.  I will say that over time she has become slightly less agitated about it.  For the most part she is behaving well on the unit.  If you get deep into a conversation with her it still apparent that she has a lot of paranoid thoughts but she certainly has not talked about hurting herself at least in the last couple days.  Today I suggested to her that we try switching her over to haloperidol decanoate.  She was resistant to this so far. Principal Problem: Bipolar affective disorder, current episode manic with psychotic symptoms (HCC) Diagnosis: Principal Problem:   Bipolar affective disorder, current episode manic with psychotic symptoms (HCC) Active Problems:   Acute psychosis (HCC)  Total Time spent with patient: 30 minutes  Past Psychiatric History: Long history of chronic intermittent psychotic disorder with multiple prior hospitalizations and suicidal behavior  Past Medical History:  Past Medical History:  Diagnosis Date  . Allergic rhinitis   . Anxiety   . Depression   . Schizophrenia [facility])     Past Surgical History:  Procedure Laterality Date  . TONSILLECTOMY     Family History:  Family History  Problem Relation Age of Onset  . Hypertension Mother   . Impulse control disorder Mother   . CVA Father   . Depression Father    Family Psychiatric  History: See previous Social History:  Social History   Substance and Sexual Activity  Alcohol Use Yes  . Alcohol/week: 1.0 standard drink  . Types: 1 Glasses of wine per week   Comment: very little     Social History   Substance and Sexual Activity  Drug Use No    Social History   Socioeconomic History  . Marital status: Married    Spouse name: [name]  . Number of children: 2  . Years of education:  16  . Highest education level: Bachelor's degree (e.g., BA, AB, BS)  Occupational History  . Occupation: [employer]  Tobacco Use  . Smoking status: Former Smoker    Packs/day: 1.00    Years: 1.00    Pack years: 1.00  . Smokeless tobacco: Never Used  Vaping Use  . Vaping Use: Never used  Substance and Sexual Activity  . Alcohol use: Yes    Alcohol/week: 1.0 standard drink    Types: 1 Glasses of wine per week    Comment: very little  . Drug use: No  . Sexual activity: Yes    Partners: Male  Other Topics Concern  . Not on file  Social History Narrative   Patient lives with her husband here in [place], his name is [name].    Patient works as a Holiday representative at [employer].    Patient is recently from [place].       Hopeful to get back to exercising once the weather cools down.    She was participating in [employer], however the pandemic has made this difficult for her.    Social Determinants of Health   Financial Resource Strain: Low Risk   . Difficulty of Paying Living Expenses: Not hard at all  Food Insecurity: Food Insecurity Present  . Worried About Programme researcher, broadcasting/film/video in the Last Year: Sometimes true  . Ran Out of Food in the Last Year:  Sometimes true  Transportation Needs: No Transportation Needs  . Lack of Transportation (Medical): No  . Lack of Transportation (Non-Medical): No  Physical Activity: Insufficiently Active  . Days of Exercise per Week: 7 days  . Minutes of Exercise per Session: 10 min  Stress: Stress Concern Present  . Feeling of Stress : To some extent  Social Connections: Moderately Isolated  . Frequency of Communication with Friends and Family: Once a week  . Frequency of Social Gatherings with Friends and Family: Never  . Attends Religious Services: More than 4 times per year  . Active Member of Clubs or Organizations: No  . Attends Banker Meetings: Never  . Marital Status: Married   Additional Social History:                          Sleep: Fair  Appetite:  Fair  Current Medications: Current Facility-Administered Medications  Medication Dose Route Frequency Provider Last Rate Last Admin  . acetaminophen (TYLENOL) tablet 650 mg  650 mg Oral Q6H PRN Calvary Difranco, Jackquline Denmark, MD   650 mg at 04/09/20 1225  . alum & mag hydroxide-simeth (MAALOX/MYLANTA) 200-200-20 MG/5ML suspension 30 mL  30 mL Oral Q4H PRN Travontae Freiberger T, MD      . benztropine (COGENTIN) tablet 1 mg  1 mg Oral BID Lonell Stamos, Jackquline Denmark, MD   1 mg at 04/16/20 3491  . haloperidol (HALDOL) tablet 20 mg  20 mg Oral QHS Soma Lizak, Jackquline Denmark, MD   20 mg at 04/15/20 2133  . lithium carbonate (LITHOBID) CR tablet 300 mg  300 mg Oral Daily Jayli Fogleman, Jackquline Denmark, MD   300 mg at 04/16/20 7915  . lithium carbonate (LITHOBID) CR tablet 600 mg  600 mg Oral QHS Rudene Poulsen T, MD   600 mg at 04/15/20 2132  . magnesium hydroxide (MILK OF MAGNESIA) suspension 30 mL  30 mL Oral Daily PRN Nataleah Scioneaux, Jackquline Denmark, MD        Lab Results: No results found for this or any previous visit (from the past 48 hour(s)).  Blood Alcohol level:  Lab Results  Component Value Date   ETH <10 03/28/2020    Metabolic Disorder Labs: Lab Results  Component Value Date   HGBA1C 5.4 04/03/2020   MPG 108 04/03/2020   No results found for: PROLACTIN Lab Results  Component Value Date   CHOL 206 (H) 04/03/2020   TRIG 91 04/03/2020   HDL 62 04/03/2020   CHOLHDL 3.3 04/03/2020   VLDL 18 04/03/2020   LDLCALC 126 (H) 04/03/2020   LDLCALC 107 (H) 03/26/2020    Physical Findings: AIMS:  , ,  ,  ,    CIWA:    COWS:     Musculoskeletal: Strength & Muscle Tone: within normal limits Gait & Station: normal Patient leans: N/A  Psychiatric Specialty Exam: Physical Exam Vitals and nursing note reviewed.  Constitutional:      Appearance: She is well-developed.  HENT:     Head: Normocephalic and atraumatic.  Eyes:     Conjunctiva/sclera: Conjunctivae normal.     Pupils: Pupils are equal, round,  and reactive to light.  Cardiovascular:     Heart sounds: Normal heart sounds.  Pulmonary:     Effort: Pulmonary effort is normal.  Abdominal:     Palpations: Abdomen is soft.  Musculoskeletal:        General: Normal range of motion.     Cervical back: Normal range of  motion.  Skin:    General: Skin is warm and dry.  Neurological:     General: No focal deficit present.     Mental Status: She is alert.  Psychiatric:        Attention and Perception: She is inattentive.        Mood and Affect: Mood is anxious.        Speech: Speech normal.        Behavior: Behavior is agitated. Behavior is not aggressive.        Thought Content: Thought content is paranoid. Thought content does not include homicidal or suicidal ideation.        Cognition and Memory: Cognition is impaired.        Judgment: Judgment is impulsive.     Review of Systems  Constitutional: Negative.   HENT: Negative.   Eyes: Negative.   Respiratory: Negative.   Cardiovascular: Negative.   Gastrointestinal: Negative.   Musculoskeletal: Negative.   Skin: Negative.   Neurological: Negative.   Psychiatric/Behavioral: The patient is nervous/anxious.     Blood pressure 132/75, pulse 79, temperature 98.1 F (36.7 C), temperature source Oral, resp. rate 18, height 5\' 3"  (1.6 m), weight 66.7 kg, SpO2 98 %.Body mass index is 26.04 kg/m.  General Appearance: Casual  Eye Contact:  Good  Speech:  Clear and Coherent  Volume:  Normal  Mood:  Anxious  Affect:  Flat  Thought Process:  Disorganized  Orientation:  Full (Time, Place, and Person)  Thought Content:  Rumination and Tangential  Suicidal Thoughts:  No  Homicidal Thoughts:  No  Memory:  Immediate;   Fair Recent;   Fair Remote;   Fair  Judgement:  Impaired  Insight:  Shallow  Psychomotor Activity:  Normal  Concentration:  Concentration: Fair  Recall:  of Knowledge:  Fair  Language:  Fair  Akathisia:  No  Handed:  Right  AIMS (if indicated):      Assets:  Desire for Improvement Physical Health Social Support  ADL's:  Intact  Cognition:  Impaired,  Mild  Sleep:  Number of Hours: 6.25     Treatment Plan Summary: Daily contact with patient to assess and evaluate symptoms and progress in treatment, Medication management and Plan Patient remains hyper focused on discharge rather than her actual illness and symptoms a point that I tried to make with her today.  I would much rather talk with her about her insight into her paranoia and how she manages it than her constant asking to be given a date for discharge.  No change to medicine for today.  I asked her to consider allowing [employer] to give her a Haldol Decanoate shot and she said she would only think about it would not agree to it necessarily.  We will follow-up on that.  [place], MD 04/16/2020, 3:40 PM

## 2020-04-16 NOTE — Progress Notes (Signed)
D: Pt alert and oriented x 4. Pt denies experiencing any anxiety/depression at this time. Pt denies experiencing any pain at this time. Pt denies experiencing any SI/HI, or AVH at this time.   Pt voices is disappointed that she is not going home today.  A: Scheduled medications administered to pt, per MD orders. Support and encouragement provided. Frequent verbal contact made. Routine safety checks conducted q15 minutes.   R: No adverse drug reactions noted. Pt verbally contracts for safety at this time. Pt complaint with medications and treatment plan. Pt interacts well with others on the unit. Pt remains safe at this time. Will continue to monitor.

## 2020-04-16 NOTE — Progress Notes (Signed)
Patient was pleasant during assessment denying SI/HI/AVH. Patient presents with paranoia about her medications, patient given education. Patient compliant with medication administration per MD orders. Patient denies anxiety, depression and pain with this Clinical research associate. Patient given education, support and encouragement to be active in her treatment plan. Patient observed interacting appropriately with staff and peers. Patient being monitored Q 15 minutes for safety. Pt remains safe on the unit. Patient hopeful that she will be leaving Friday.

## 2020-04-16 NOTE — Plan of Care (Signed)
  Problem: Education: Goal: Ability to make informed decisions regarding treatment will improve Outcome: Progressing   Problem: Coping: Goal: Coping ability will improve Outcome: Progressing   Problem: Health Behavior/Discharge Planning: Goal: Identification of resources available to assist in meeting health care needs will improve Outcome: Progressing   Problem: Medication: Goal: Compliance with prescribed medication regimen will improve Outcome: Progressing   Problem: Self-Concept: Goal: Ability to disclose and discuss suicidal ideas will improve Outcome: Progressing Goal: Will verbalize positive feelings about self Outcome: Progressing   Problem: Education: Goal: Knowledge of [facility] Education information/materials will improve Outcome: Progressing Goal: Emotional status will improve Outcome: Progressing Goal: Mental status will improve Outcome: Progressing Goal: Verbalization of understanding the information provided will improve Outcome: Progressing   Problem: Activity: Goal: Interest or engagement in activities will improve Outcome: Progressing Goal: Sleeping patterns will improve Outcome: Progressing   Problem: Coping: Goal: Ability to verbalize frustrations and anger appropriately will improve Outcome: Progressing Goal: Ability to demonstrate self-control will improve Outcome: Progressing   Problem: Health Behavior/Discharge Planning: Goal: Identification of resources available to assist in meeting health care needs will improve Outcome: Progressing Goal: Compliance with treatment plan for underlying cause of condition will improve Outcome: Progressing   Problem: Physical Regulation: Goal: Ability to maintain clinical measurements within normal limits will improve Outcome: Progressing   Problem: Safety: Goal: Periods of time without injury will increase Outcome: Progressing   

## 2020-04-16 NOTE — Progress Notes (Signed)
Patient is pleasant and easy to engage. She denies SI/HI/AVH depression and anxiety on this encounter. She received her prescribed meds and tolerated without incident. She is active on the unit and engages well with other peers.  She remains safe on the unit with 15 minute safety checks. Informed to contact staff with any safety concerns.  Cleo Butler-Nicholson, LPN

## 2020-04-16 NOTE — Progress Notes (Signed)
Recreation Therapy Notes  Date: 04/16/2020  Time: 9:30 am   Location: Craft room     Behavioral response: N/A   Intervention Topic: Self-care   Discussion/Intervention: Patient did not attend group.   Clinical Observations/Feedback:  Patient did not attend group.   Yazlin Ekblad LRT/CTRS        Evren Shankland 04/16/2020 11:32 AM

## 2020-04-17 MED ORDER — QUETIAPINE FUMARATE 100 MG PO TABS
100.0000 mg | ORAL_TABLET | Freq: Every day | ORAL | Status: DC
  Administered 2020-04-17 – 2020-04-19 (×3): 100 mg via ORAL
  Filled 2020-04-17 (×3): qty 1

## 2020-04-17 NOTE — Progress Notes (Signed)
Patient was pleasant during assessment denying SI/HI/AVH.Patient presents with paranoia about her medications, patient given education.Patient compliant with medication administration per MD orders. Patient denies anxiety, depression and pain with this Clinical research associate. Patient given education, support and encouragement to be active in her treatment plan. Patient observed interacting appropriately with staff and peers. Patient being monitored Q 15 minutes for safety. Pt remains safe on the unit. Patient hopeful that she will be leaving Friday. Patient paranoid about her new medication tonight, patient given education and support. Patient complaint with medication.

## 2020-04-17 NOTE — Progress Notes (Signed)
[facility]  04/17/2020 1:39 PM Tanya Simpson  MRN:  [id number] Subjective: Patient seen and chart reviewed.  As usual the patient's entire focus of her conversation is on asking to be discharged as soon as possible.  Today I tried to redirect her by pointing out that she does not seem to be putting enough concern on the actual mental health problems she has or the stresses she was having at home or the symptoms she was having.  She is very resistant to this line of thinking constantly turning the conversation back to accusing me of being mean to her for not discharging her or turning the topic to being angry at her mother or some other such irrelevant thing.  During the course of this especially when she gets emotional it becomes clear that she is still paranoid.  She spontaneously talks about how the computer was turning against her and about her fear of various unrealistic things.  Gets tearful easily.  Cannot really be redirected to discussion of symptoms.  Always going back to request for discharge with pressured speech constantly interrupting.  On the unit she is adequately dressed and groomed.  She goes to activities but is not very participatory Principal Problem: Bipolar affective disorder, current episode manic with psychotic symptoms (HCC) Diagnosis: Principal Problem:   Bipolar affective disorder, current episode manic with psychotic symptoms (HCC) Active Problems:   Acute psychosis (HCC)  Total Time spent with patient: 30 minutes  Past Psychiatric History: Past history of recurrent bipolar disorder or schizoaffective disorder.  2 recent suicide attempts the most recent 1 quite serious.  Past Medical History:  Past Medical History:  Diagnosis Date  . Allergic rhinitis   . Anxiety   . Depression   . Schizophrenia [facility](HCC)     Past Surgical History:  Procedure Laterality Date  . TONSILLECTOMY     Family History:  Family History  Problem Relation Age of Onset  .  Hypertension Mother   . Impulse control disorder Mother   . CVA Father   . Depression Father    Family Psychiatric  History: See previous Social History:  Social History   Substance and Sexual Activity  Alcohol Use Yes  . Alcohol/week: 1.0 standard drink  . Types: 1 Glasses of wine per week   Comment: very little     Social History   Substance and Sexual Activity  Drug Use No    Social History   Socioeconomic History  . Marital status: Married    Spouse name: Onalee HuaDavid  . Number of children: 2  . Years of education: 16  . Highest education level: Bachelor's degree (e.g., BA, AB, BS)  Occupational History  . Occupation: [employer]  Tobacco Use  . Smoking status: Former Smoker    Packs/day: 1.00    Years: 1.00    Pack years: 1.00  . Smokeless tobacco: Never Used  Vaping Use  . Vaping Use: Never used  Substance and Sexual Activity  . Alcohol use: Yes    Alcohol/week: 1.0 standard drink    Types: 1 Glasses of wine per week    Comment: very little  . Drug use: No  . Sexual activity: Yes    Partners: Male  Other Topics Concern  . Not on file  Social History Narrative   [place], his name is Onalee HuaDavid.    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    [place]       Hopeful to get back to exercising once the weather cools down.    She was participating in [employer], however the pandemic has made this difficult for her.    Social Determinants of Health   Financial Resource Strain: Low Risk   . Difficulty of Paying Living Expenses: Not hard at all  Food Insecurity: Food Insecurity Present  . Worried About Programme researcher, broadcasting/film/video in the Last Year: Sometimes true  . Ran Out of Food in the Last Year: Sometimes true  Transportation Needs: No Transportation Needs  . Lack of Transportation (Medical): No  . Lack of Transportation (Non-Medical): No  Physical Activity: Insufficiently Active  . Days of Exercise  per Week: 7 days  . Minutes of Exercise per Session: 10 min  Stress: Stress Concern Present  . Feeling of Stress : To some extent  Social Connections: Moderately Isolated  . Frequency of Communication with Friends and Family: Once a week  . Frequency of Social Gatherings with Friends and Family: Never  . Attends Religious Services: More than 4 times per year  . Active Member of Clubs or Organizations: No  . Attends Banker Meetings: Never  . Marital Status: Married   Additional Social History:                         Sleep: Fair  Appetite:  Fair  Current Medications: Current Facility-Administered Medications  Medication Dose Route Frequency Provider Last Rate Last Admin  . acetaminophen (TYLENOL) tablet 650 mg  650 mg Oral Q6H PRN Arnulfo Batson, Jackquline Denmark, MD   650 mg at 04/09/20 1225  . alum & mag hydroxide-simeth (MAALOX/MYLANTA) 200-200-20 MG/5ML suspension 30 mL  30 mL Oral Q4H PRN Kadence Mimbs T, MD      . benztropine (COGENTIN) tablet 1 mg  1 mg Oral BID Luqman Perrelli, Jackquline Denmark, MD   1 mg at 04/17/20 0825  . haloperidol (HALDOL) tablet 20 mg  20 mg Oral QHS Caralee Morea, Jackquline Denmark, MD   20 mg at 04/16/20 2113  . lithium carbonate (LITHOBID) CR tablet 300 mg  300 mg Oral Daily Rohn Fritsch, Jackquline Denmark, MD   300 mg at 04/17/20 0825  . lithium carbonate (LITHOBID) CR tablet 600 mg  600 mg Oral QHS Aanshi Batchelder T, MD   600 mg at 04/16/20 2113  . magnesium hydroxide (MILK OF MAGNESIA) suspension 30 mL  30 mL Oral Daily PRN Tayler Lassen, Jackquline Denmark, MD        Lab Results: No results found for this or any previous visit (from the past 48 hour(s)).  Blood Alcohol level:  Lab Results  Component Value Date   ETH <10 03/28/2020    Metabolic Disorder Labs: Lab Results  Component Value Date   HGBA1C 5.4 04/03/2020   MPG 108 04/03/2020   No results found for: PROLACTIN Lab Results  Component Value Date   CHOL 206 (H) 04/03/2020   TRIG 91 04/03/2020   HDL 62 04/03/2020   CHOLHDL 3.3  04/03/2020   VLDL 18 04/03/2020   LDLCALC 126 (H) 04/03/2020   LDLCALC 107 (H) 03/26/2020    Physical Findings: AIMS:  , ,  ,  ,    CIWA:    COWS:     Musculoskeletal: Strength & Muscle Tone: within normal limits Gait & Station: normal Patient leans: N/A  Psychiatric Specialty Exam: Physical Exam Vitals and nursing note reviewed.  Constitutional:  Appearance: She is well-developed.  HENT:     Head: Normocephalic and atraumatic.  Eyes:     Conjunctiva/sclera: Conjunctivae normal.     Pupils: Pupils are equal, round, and reactive to light.  Cardiovascular:     Heart sounds: Normal heart sounds.  Pulmonary:     Effort: Pulmonary effort is normal.  Abdominal:     Palpations: Abdomen is soft.  Musculoskeletal:        General: Normal range of motion.     Cervical back: Normal range of motion.  Skin:    General: Skin is warm and dry.  Neurological:     General: No focal deficit present.     Mental Status: She is alert.  Psychiatric:        Attention and Perception: She is inattentive.        Mood and Affect: Mood is anxious. Affect is inappropriate.        Speech: Speech is rapid and pressured.        Behavior: Behavior is agitated.        Thought Content: Thought content is paranoid. Thought content does not include homicidal or suicidal ideation.        Cognition and Memory: Cognition is impaired.        Judgment: Judgment is impulsive.     Review of Systems  Constitutional: Negative.   HENT: Negative.   Eyes: Negative.   Respiratory: Negative.   Cardiovascular: Negative.   Gastrointestinal: Negative.   Musculoskeletal: Negative.   Skin: Negative.   Neurological: Negative.   Psychiatric/Behavioral: Positive for behavioral problems and dysphoric mood. The patient is nervous/anxious.     Blood pressure 124/66, pulse 85, temperature 98.7 F (37.1 C), temperature source Oral, resp. rate 17, height 5\' 3"  (1.6 m), weight 66.7 kg, SpO2 97 %.Body mass index is  26.04 kg/m.  General Appearance: Casual  Eye Contact:  Fair  Speech:  Pressured  Volume:  Increased  Mood:  Anxious, Dysphoric and Irritable  Affect:  Congruent  Thought Process:  Disorganized  Orientation:  Full (Time, Place, and Person)  Thought Content:  Illogical, Paranoid Ideation, Rumination and Tangential  Suicidal Thoughts:  No  Homicidal Thoughts:  No  Memory:  Immediate;   Fair Recent;   Poor Remote;   Fair  Judgement:  Impaired  Insight:  Shallow  Psychomotor Activity:  Restlessness  Concentration:  Concentration: Poor  Recall:  of Knowledge:  Fair  Language:  Fair  Akathisia:  No  Handed:  Right  AIMS (if indicated):     Assets:  Desire for Improvement Housing Resilience Social Support  ADL's:  Intact  Cognition:  Impaired,  Mild  Sleep:  Number of Hours: 7.25     Treatment Plan Summary: Daily contact with patient to assess and evaluate symptoms and progress in treatment, Medication management and Plan Patient is very emotional with a real tendency to turn things around and blame me or others in her life but shows very limited ability to assess her own symptoms.  Seems to still be suffering from significant paranoia.  I spent some time today patiently trying to get her to engage in some conversation about her actual problems but to little avail.  I do not know if this sort of thing may be largely personality but I do know that she still shows significant signs of paranoia.  Because of this I told her that I still am not feeling safe about discharging her.  We will  check another lithium level.  She was resistant to the Haldol Decanoate shot but given her persistent symptoms it may be just as well.  She eventually did except that I am going to start adding Seroquel to what she is taking as well.  I had suggested Zyprexa but she declined it saying it had caused too much weight gain in the past.  Mordecai Rasmussen, MD 04/17/2020, 1:39 PM

## 2020-04-17 NOTE — Plan of Care (Signed)
Patient compliant with medication administration per MD orders  Problem: Medication: Goal: Compliance with prescribed medication regimen will improve Outcome: Progressing   

## 2020-04-17 NOTE — Progress Notes (Signed)
Recreation Therapy Notes  Date: 04/17/2020  Time: 9:30 am   Location: Craft room     Behavioral response: N/A   Intervention Topic: Goals   Discussion/Intervention: Patient did not attend group.   Clinical Observations/Feedback:  Patient did not attend group.   Dakarri Kessinger LRT/CTRS        Maksim Peregoy 04/17/2020 11:18 AM

## 2020-04-17 NOTE — Progress Notes (Signed)
D: Pt alert and oriented x 4. Pt rates depression 3/10, hopelessness 3/10, and anxiety 3/10.Pt goal: "discharge." Pt reports energy level as normal and concentration as being good. Pt reports sleep last night as being good. Pt did not receive medications for sleep. Pt denies experiencing any pain at this time. Pt denies experiencing any SI/HI, or AVH at this time.   Pt is upset and does not understand why she is unable to go home today. Pt is still paranoid at time and very anxious.   A: Scheduled medications administered to pt, per MD orders. Support and encouragement provided. Frequent verbal contact made. Routine safety checks conducted q15 minutes.   R: No adverse drug reactions noted. Pt verbally contracts for safety at this time. Pt complaint with medications and treatment plan. Pt interacts well with others on the unit. Pt remains safe at this time. Will continue to monitor.

## 2020-04-18 LAB — LITHIUM LEVEL: Lithium Lvl: 0.82 mmol/L (ref 0.60–1.20)

## 2020-04-18 NOTE — Plan of Care (Signed)
  Problem: Coping Skills Goal: STG - Patient will identify 3 positive coping skills strategies to use post d/c within 5 recreation therapy group sessions Description: STG - Patient will identify 3 positive coping skills strategies to use post d/c within 5 recreation therapy group sessions Outcome: Progressing   

## 2020-04-18 NOTE — Progress Notes (Signed)
Patient refused to take her morning medication at this time, stating "he gave me a new medicine at night, I don't want that extra medicine. I'm just exhausted, I just need to lay down. I'm not saying I won't take it, I just need to go lay down". MD will be notified.

## 2020-04-18 NOTE — Progress Notes (Signed)
[facility] MD Progress Note  04/18/2020 1:34 PM Tanya Simpson  MRN:  [id number] Subjective: Patient seen for follow-up.  59 year old woman with bipolar disorder.  As usual she has only one topic she wants to discuss and that is her date of discharge which she would prefer to be the sooner the better.  Patient continues to have pressured speech rambling thoughts irritability and when frustrated will veer off into talking about clearly paranoid topics.  Lithium level came back above 0.8.  No sign of lithium toxicity.  Patient took the Seroquel last night and claims that it made her terribly oversedated.  No sign of sedation this morning. Principal Problem: Bipolar affective disorder, current episode manic with psychotic symptoms (HCC) Diagnosis: Principal Problem:   Bipolar affective disorder, current episode manic with psychotic symptoms (HCC) Active Problems:   Acute psychosis (HCC)  Total Time spent with patient: 30 minutes  Past Psychiatric History: Past history of several prior hospitalizations recent suicide attempt  Past Medical History:  Past Medical History:  Diagnosis Date  . Allergic rhinitis   . Anxiety   . Depression   . Schizophrenia [facility]     Past Surgical History:  Procedure Laterality Date  . TONSILLECTOMY     Family History:  Family History  Problem Relation Age of Onset  . Hypertension Mother   . Impulse control disorder Mother   . CVA Father   . Depression Father    Family Psychiatric  History: See previous Social History:  Social History   Substance and Sexual Activity  Alcohol Use Yes  . Alcohol/week: 1.0 standard drink  . Types: 1 Glasses of wine per week   Comment: very little     Social History   Substance and Sexual Activity  Drug Use No    Social History   Socioeconomic History  . Marital status: Married    Spouse name: [name]  . Number of children: 2  . Years of education: 16  . Highest education level: Bachelor's degree (e.g., BA, AB, BS)   Occupational History  . Occupation: [employer]  Tobacco Use  . Smoking status: Former Smoker    Packs/day: 1.00    Years: 1.00    Pack years: 1.00  . Smokeless tobacco: Never Used  Vaping Use  . Vaping Use: Never used  Substance and Sexual Activity  . Alcohol use: Yes    Alcohol/week: 1.0 standard drink    Types: 1 Glasses of wine per week    Comment: very little  . Drug use: No  . Sexual activity: Yes    Partners: Male  Other Topics Concern  . Not on file  Social History Narrative   Patient lives with her husband here in [place], his name is [name].    Patient works as a Holiday representative at [employer].    Patient is recently from [place].       Hopeful to get back to exercising once the weather cools down.    She was participating in [employer], however the pandemic has made this difficult for her.    Social Determinants of Health   Financial Resource Strain: Low Risk   . Difficulty of Paying Living Expenses: Not hard at all  Food Insecurity: Food Insecurity Present  . Worried About Programme researcher, broadcasting/film/video in the Last Year: Sometimes true  . Ran Out of Food in the Last Year: Sometimes true  Transportation Needs: No Transportation Needs  . Lack of Transportation (Medical): No  . Lack  of Transportation (Non-Medical): No  Physical Activity: Insufficiently Active  . Days of Exercise per Week: 7 days  . Minutes of Exercise per Session: 10 min  Stress: Stress Concern Present  . Feeling of Stress : To some extent  Social Connections: Moderately Isolated  . Frequency of Communication with Friends and Family: Once a week  . Frequency of Social Gatherings with Friends and Family: Never  . Attends Religious Services: More than 4 times per year  . Active Member of Clubs or Organizations: No  . Attends Banker Meetings: Never  . Marital Status: Married   Additional Social History:                         Sleep: Fair  Appetite:  Fair  Current  Medications: Current Facility-Administered Medications  Medication Dose Route Frequency Provider Last Rate Last Admin  . acetaminophen (TYLENOL) tablet 650 mg  650 mg Oral Q6H PRN Shahira Fiske, Jackquline Denmark, MD   650 mg at 04/09/20 1225  . alum & mag hydroxide-simeth (MAALOX/MYLANTA) 200-200-20 MG/5ML suspension 30 mL  30 mL Oral Q4H PRN Vendetta Pittinger T, MD      . benztropine (COGENTIN) tablet 1 mg  1 mg Oral BID Murl Zogg T, MD   1 mg at 04/18/20 1156  . haloperidol (HALDOL) tablet 20 mg  20 mg Oral QHS Aleksa Collinsworth, Jackquline Denmark, MD   20 mg at 04/17/20 2107  . lithium carbonate (LITHOBID) CR tablet 300 mg  300 mg Oral Daily Royann Wildasin T, MD   300 mg at 04/18/20 1156  . lithium carbonate (LITHOBID) CR tablet 600 mg  600 mg Oral QHS Lenzy Kerschner T, MD   600 mg at 04/17/20 2107  . magnesium hydroxide (MILK OF MAGNESIA) suspension 30 mL  30 mL Oral Daily PRN Mardella Nuckles T, MD      . QUEtiapine (SEROQUEL) tablet 100 mg  100 mg Oral QHS Dessie Tatem, Jackquline Denmark, MD   100 mg at 04/17/20 2107    Lab Results:  Results for orders placed or performed during the hospital encounter of 04/01/20 (from the past 48 hour(s))  Lithium level     Status: None   Collection Time: 04/18/20 10:12 AM  Result Value Ref Range   Lithium Lvl 0.82 0.60 - 1.20 mmol/L    Comment: Performed at [facility], [address] [place], [place] [address]    Blood Alcohol level:  Lab Results  Component Value Date   [facility] <10 03/28/2020    Metabolic Disorder Labs: Lab Results  Component Value Date   HGBA1C 5.4 04/03/2020   MPG 108 04/03/2020   No results found for: PROLACTIN Lab Results  Component Value Date   CHOL 206 (H) 04/03/2020   TRIG 91 04/03/2020   HDL 62 04/03/2020   CHOLHDL 3.3 04/03/2020   VLDL 18 04/03/2020   LDLCALC 126 (H) 04/03/2020   LDLCALC 107 (H) 03/26/2020    Physical Findings: AIMS:  , ,  ,  ,    CIWA:    COWS:     Musculoskeletal: Strength & Muscle Tone: within normal limits Gait & Station:  normal Patient leans: N/A  Psychiatric Specialty Exam: Physical Exam Vitals and nursing note reviewed.  Constitutional:      Appearance: She is well-developed.  HENT:     Head: Normocephalic and atraumatic.  Eyes:     Conjunctiva/sclera: Conjunctivae normal.     Pupils: Pupils are equal, round, and reactive  to light.  Cardiovascular:     Heart sounds: Normal heart sounds.  Pulmonary:     Effort: Pulmonary effort is normal.  Abdominal:     Palpations: Abdomen is soft.  Musculoskeletal:        General: Normal range of motion.     Cervical back: Normal range of motion.  Skin:    General: Skin is warm and dry.  Neurological:     General: No focal deficit present.     Mental Status: She is alert.  Psychiatric:        Attention and Perception: She is inattentive.        Mood and Affect: Mood is anxious. Affect is labile and inappropriate.        Speech: Speech is rapid and pressured and tangential.        Behavior: Behavior is agitated. Behavior is not aggressive.        Thought Content: Thought content is paranoid. Thought content does not include homicidal or suicidal ideation.        Cognition and Memory: Cognition is impaired.        Judgment: Judgment is impulsive.     Review of Systems  Constitutional: Negative.   HENT: Negative.   Eyes: Negative.   Respiratory: Negative.   Cardiovascular: Negative.   Gastrointestinal: Negative.   Musculoskeletal: Negative.   Skin: Negative.   Neurological: Negative.   Psychiatric/Behavioral: Positive for agitation. The patient is nervous/anxious.     Blood pressure 127/82, pulse 79, temperature 97.9 F (36.6 C), temperature source Oral, resp. rate 17, height 5\' 3"  (1.6 m), weight 66.7 kg, SpO2 99 %.Body mass index is 26.04 kg/m.  General Appearance: Casual  Eye Contact:  Fair  Speech:  Pressured  Volume:  Increased  Mood:  Anxious and Irritable  Affect:  Congruent and Inappropriate  Thought Process:  Disorganized   Orientation:  Full (Time, Place, and Person)  Thought Content:  Illogical, Paranoid Ideation and Rumination  Suicidal Thoughts:  No  Homicidal Thoughts:  No  Memory:  Immediate;   Fair Recent;   Fair Remote;   Poor  Judgement:  Impaired  Insight:  Shallow  Psychomotor Activity:  Restlessness  Concentration:  Concentration: Fair  Recall:  of Knowledge:  Fair  Language:  Fair  Akathisia:  No  Handed:  Right  AIMS (if indicated):     Assets:  Desire for Improvement Housing Resilience  ADL's:  Intact  Cognition:  Impaired,  Mild  Sleep:  Number of Hours: 6.5     Treatment Plan Summary: Daily contact with patient to assess and evaluate symptoms and progress in treatment, Medication management and Plan Patient continues to be hyperactive hyperverbal and most concerning still on insightful and paranoid.  She is resistant to adding antipsychotic or changing antipsychotic.  I will leave the Seroquel in place for now and have tried to educate her that she will become tolerant to it with time.  No change to lithium dose.  [employer], MD 04/18/2020, 1:34 PM

## 2020-04-18 NOTE — Progress Notes (Signed)
Recreation Therapy Notes   Date: 04/18/2020  Time: 9:30 am   Location: Craft room     Behavioral response: N/A   Intervention Topic: Relaxation    Discussion/Intervention: Patient did not attend group.   Clinical Observations/Feedback:  Patient did not attend group.   (Patient was given Public house manager on [employer] [place] [employer] and the precautions and procedures put in place to ensure the safety of the patient.)    Lamees Gable LRT/CTRS        Albaro Deviney 04/18/2020 11:47 AM

## 2020-04-18 NOTE — Plan of Care (Signed)
D- Patient alert and oriented. Patient presented in an anxious, but pleasant mood on assessment stating that she slept "too much, I was out of it. I don't want that extra med at night. I'm just exhausted, I need to go lay down". Patient had no other complaints/concerns to voice to this Clinical research associate. When asked about depression/anxiety, patient stated "I don't know, can I just have the medicine please". Patient is upset that she is not going home. Patient denied SI, HI, AVH, and pain at this time. Patient had no stated goals for today.   A- Scheduled medications administered to patient, per MD orders. Support and encouragement provided.  Routine safety checks conducted every 15 minutes.  Patient informed to notify staff with problems or concerns.  R- No adverse drug reactions noted. Patient contracts for safety at this time. Patient compliant with medications and treatment plan. Patient receptive, calm, and cooperative. Patient interacts well with others on the unit.  Patient remains safe at this time.  Problem: Education: Goal: Ability to make informed decisions regarding treatment will improve Outcome: Not Progressing   Problem: Coping: Goal: Coping ability will improve Outcome: Not Progressing   Problem: Health Behavior/Discharge Planning: Goal: Identification of resources available to assist in meeting health care needs will improve Outcome: Not Progressing   Problem: Medication: Goal: Compliance with prescribed medication regimen will improve Outcome: Not Progressing   Problem: Self-Concept: Goal: Ability to disclose and discuss suicidal ideas will improve Outcome: Not Progressing Goal: Will verbalize positive feelings about self Outcome: Not Progressing   Problem: Education: Goal: Knowledge of [facility] Education information/materials will improve Outcome: Not Progressing Goal: Emotional status will improve Outcome: Not Progressing Goal: Mental status will improve Outcome:  Not Progressing Goal: Verbalization of understanding the information provided will improve Outcome: Not Progressing   Problem: Activity: Goal: Interest or engagement in activities will improve Outcome: Not Progressing Goal: Sleeping patterns will improve Outcome: Not Progressing   Problem: Coping: Goal: Ability to verbalize frustrations and anger appropriately will improve Outcome: Not Progressing Goal: Ability to demonstrate self-control will improve Outcome: Not Progressing   Problem: Health Behavior/Discharge Planning: Goal: Identification of resources available to assist in meeting health care needs will improve Outcome: Not Progressing Goal: Compliance with treatment plan for underlying cause of condition will improve Outcome: Not Progressing   Problem: Physical Regulation: Goal: Ability to maintain clinical measurements within normal limits will improve Outcome: Not Progressing   Problem: Safety: Goal: Periods of time without injury will increase Outcome: Not Progressing

## 2020-04-19 NOTE — Progress Notes (Signed)
Patient ID: Tanya Simpson, female   DOB: 09-12-1961, 59 y.o.   MRN: [id number]    [facility] MD Progress Note  04/19/2020 3:07 PM Maylyn Simpson  MRN:  [id number] Principal Psychiatric Diagnosis: Bipolar affective disorder, current episode manic with psychotic symptoms (HCC) Diagnosis: Principal Problem:   Bipolar affective disorder, current episode manic with psychotic symptoms (HCC) Active Problems:   Acute psychosis (HCC)   Total Time spent with patient:  15      DAILY NARRATIVE  Has been  Here a long time and wants to go home but not ready  Needs better stabilization of pressured speech and psychosis  Will do meeting with husband in am and review meds   Subjective:   Wants to go hom e Objective:   As above  Physical Findings: AIMS:  , ,  ,  ,    CIWA:    COWS:     PHYSICAL EXAM  Physical Exam  SYSTEMS REVIEW  Review of Systems  MENTAL STATUS   Pertinents only Alert cooperative oriented to person place date and time Strange an odd but better Has some elements of disorganized thought mood swings and all  No active SI or HI Somewhat concrete No new other psychosis  Concentration and attention okay Judgement insight reliability fair  Consciousness not clouded or fluctuant      Current Medications: Current Facility-Administered Medications  Medication Dose Route Frequency Provider Last Rate Last Admin  . acetaminophen (TYLENOL) tablet 650 mg  650 mg Oral Q6H PRN Clapacs, Tanya Denmark, MD   650 mg at 04/09/20 1225  . alum & mag hydroxide-simeth (MAALOX/MYLANTA) 200-200-20 MG/5ML suspension 30 mL  30 mL Oral Q4H PRN Clapacs, Tanya T, MD      . benztropine (COGENTIN) tablet 1 mg  1 mg Oral BID Clapacs, Tanya Denmark, MD   1 mg at 04/19/20 0827  . haloperidol (HALDOL) tablet 20 mg  20 mg Oral QHS Clapacs, Tanya Denmark, MD   20 mg at 04/18/20 2141  . lithium carbonate (LITHOBID) CR tablet 300 mg  300 mg Oral Daily Clapacs, Tanya Denmark, MD   300 mg at 04/19/20 0827  . lithium carbonate  (LITHOBID) CR tablet 600 mg  600 mg Oral QHS Clapacs, Tanya T, MD   600 mg at 04/18/20 2141  . magnesium hydroxide (MILK OF MAGNESIA) suspension 30 mL  30 mL Oral Daily PRN Clapacs, Tanya T, MD      . QUEtiapine (SEROQUEL) tablet 100 mg  100 mg Oral QHS Clapacs, Tanya Denmark, MD   100 mg at 04/18/20 2141    Lab Results:  Results for orders placed or performed during the Simpson encounter of 04/01/20 (from the past 48 hour(s))  Lithium level     Status: None   Collection Time: 04/18/20 10:12 AM  Result Value Ref Range   Lithium Lvl 0.82 0.60 - 1.20 mmol/L    Comment: Performed at [facility], [address] [place], [place] [address]    Blood Alcohol level:  Lab Results  Component Value Date   Tanya Simpson <10 03/28/2020    Metabolic Disorder Labs: Lab Results  Component Value Date   HGBA1C 5.4 04/03/2020   MPG 108 04/03/2020   No results found for: PROLACTIN Lab Results  Component Value Date   CHOL 206 (H) 04/03/2020   TRIG 91 04/03/2020   HDL 62 04/03/2020   CHOLHDL 3.3 04/03/2020   VLDL 18 04/03/2020   LDLCALC 126 (H) 04/03/2020   LDLCALC 107 (H)  03/26/2020     Past Psychiatric History:   Past Medical/Surgical History  Past Medical History:  Diagnosis Date  . Allergic rhinitis   . Anxiety   . Depression   . Schizophrenia [facility]     Past Surgical History:  Procedure Laterality Date  . TONSILLECTOMY      Family Medical and Psychiatric History:   Family History  Problem Relation Age of Onset  . Hypertension Mother   . Impulse control disorder Mother   . CVA Father   . Depression Father     Social History:   Social History   Substance and Sexual Activity  Alcohol Use Yes  . Alcohol/week: 1.0 standard drink  . Types: 1 Glasses of wine per week   Comment: very little     Social History   Substance and Sexual Activity  Drug Use No    Social History   Socioeconomic History  . Marital status: Married    Spouse name: Tanya Simpson  . Number of children:  2  . Years of education: 16  . Highest education level: Bachelor's degree (e.g., BA, AB, BS)  Occupational History  . Occupation: [employer]  Tobacco Use  . Smoking status: Former Smoker    Packs/day: 1.00    Years: 1.00    Pack years: 1.00  . Smokeless tobacco: Never Used  Vaping Use  . Vaping Use: Never used  Substance and Sexual Activity  . Alcohol use: Yes    Alcohol/week: 1.0 standard drink    Types: 1 Glasses of wine per week    Comment: very little  . Drug use: No  . Sexual activity: Yes    Partners: Male  Other Topics Concern  . Not on file  Social History Narrative   Patient lives with her husband here in [place], his name is Tanya Simpson.    Patient works as a Holiday representative at [employer].    Patient is recently from [place].       Hopeful to get back to exercising once the weather cools down.    She was participating in [employer], however the pandemic has made this difficult for her.    Social Determinants of Health   Financial Resource Strain: Low Risk   . Difficulty of Paying Living Expenses: Not hard at all  Food Insecurity: Food Insecurity Present  . Worried About Programme researcher, broadcasting/film/video in the Last Year: Sometimes true  . Ran Out of Food in the Last Year: Sometimes true  Transportation Needs: No Transportation Needs  . Lack of Transportation (Medical): No  . Lack of Transportation (Non-Medical): No  Physical Activity: Insufficiently Active  . Days of Exercise per Week: 7 days  . Minutes of Exercise per Session: 10 min  Stress: Stress Concern Present  . Feeling of Stress : To some extent  Social Connections: Moderately Isolated  . Frequency of Communication with Friends and Family: Once a week  . Frequency of Social Gatherings with Friends and Family: Never  . Attends Religious Services: More than 4 times per year  . Active Member of Clubs or Organizations: No  . Attends Banker Meetings: Never  . Marital Status: Married                             Summary:    Awaits family meeting and am adjustment of meds      Estimated Length of Stay:   As short as  possible   Roselind Messier, MD 04/19/2020, 3:07 PM

## 2020-04-19 NOTE — Progress Notes (Signed)
D: Pt alert and oriented x 4. Pt rates depression 5/10, hopelessness 3/10, and anxiety 5/10.Pt goal: "speaking w/new Dr. To discussing discharge plans and getting marital counseling. Pt reports energy level as low and concentration as being good. Pt reports sleep last night as being good. Pt did not receive medications for sleep. Pt denies experiencing any pain at this time. Pt denies experiencing any SI/HI, or AVH at this time.   A: Scheduled medications administered to pt, per MD orders. Support and encouragement provided. Frequent verbal contact made. Routine safety checks conducted q15 minutes.   R: No adverse drug reactions noted. Pt verbally contracts for safety at this time. Pt complaint with medications and treatment plan. Pt interacts well with others on the unit. Pt remains safe at this time. Will continue to monitor.

## 2020-04-19 NOTE — [facility] Group Notes (Signed)
LCSW Group Therapy Note  04/19/2020   1:03- 1:42 PM  Type of Therapy and Topic:  Group Therapy: Anger Cues and Responses  Participation Level:  Active   Description of Group:   In this group, patients learned how to recognize the physical, cognitive, emotional, and behavioral responses they have to anger-provoking situations.  They identified a recent time they became angry and how they reacted.  They analyzed how their reaction was possibly beneficial and how it was possibly unhelpful.  The group discussed a variety of healthier coping skills that could help with such a situation in the future.  Focus was placed on how helpful it is to recognize the underlying emotions to our anger, because working on those can lead to a more permanent solution as well as our ability to focus on the important rather than the urgent.  Therapeutic Goals: 1. Patients will remember their last incident of anger and how they felt emotionally and physically, what their thoughts were at the time, and how they behaved. 2. Patients will identify how their behavior at that time worked for them, as well as how it worked against them. 3. Patients will explore possible new behaviors to use in future anger situations. 4. Patients will learn that anger itself is normal and cannot be eliminated, and that healthier reactions can assist with resolving conflict rather than worsening situations.  Summary of Patient Progress:  The patient shared that her most recent time of anger was today when her husband called her. Patient stated that her mother has been calling her husband to get updates on her and that makes her angry. Patient stated she might call her mother and speak to her about that. Patient feels calm today and feeling better. Patient interacted well with her peers in group.   Therapeutic Modalities:   Cognitive Behavioral Therapy    Susa Simmonds, LCSWA 04/19/2020  2:08 PM

## 2020-04-20 MED ORDER — QUETIAPINE FUMARATE 25 MG PO TABS
75.0000 mg | ORAL_TABLET | Freq: Every day | ORAL | Status: DC
  Administered 2020-04-20 – 2020-04-21 (×2): 75 mg via ORAL
  Filled 2020-04-20 (×2): qty 3

## 2020-04-20 MED ORDER — HALOPERIDOL 5 MG PO TABS
15.0000 mg | ORAL_TABLET | Freq: Every day | ORAL | Status: DC
  Administered 2020-04-20 – 2020-04-21 (×2): 15 mg via ORAL
  Filled 2020-04-20 (×2): qty 3

## 2020-04-20 NOTE — [facility] Group Notes (Signed)
[facility] LCSW Group Therapy Note  Date/Time:  04/20/2020 1:54 PM- 2:44 PM   Type of Therapy and Topic:  Group Therapy:  Healthy and Unhealthy Supports  Participation Level:  Minimal   Description of Group:  Patients in this group were introduced to the idea of adding a variety of healthy supports to address the various needs in their lives.Patients discussed what additional healthy supports could be helpful in their recovery and wellness after discharge in order to prevent future hospitalizations.   An emphasis was placed on using counselor, doctor, therapy groups, 12-step groups, and problem-specific support groups to expand supports.  They also worked as a group on developing a specific plan for several patients to deal with unhealthy supports through boundary-setting, psychoeducation with loved ones, and even termination of relationships.   Therapeutic Goals:   1)  discuss importance of adding supports to stay well once out of the hospital  2)  compare healthy versus unhealthy supports and identify some examples of each  3)  generate ideas and descriptions of healthy supports that can be added  4)  offer mutual support about how to address unhealthy supports  5)  encourage active participation in and adherence to discharge plan    Summary of Patient Progress:  The patient checked into group today feeling not well. Patient stated that she was feeling cold and having pains. Patient left group and went back to her room due to not feeling well.    Therapeutic Modalities:   Motivational Interviewing Brief Solution-Focused Therapy  Susa Simmonds, Theresia Majors 04/20/2020  3:11 PM

## 2020-04-20 NOTE — Progress Notes (Signed)
Patient stated that she would rather wait until she talks to the doctor before she takes her morning medication.

## 2020-04-20 NOTE — Tx Team (Signed)
Interdisciplinary Treatment and Diagnostic Plan Update  04/20/2020 Time of Session: 1050 Tanya Simpson MRN: [id number]  Principal Diagnosis: Bipolar affective disorder, current episode manic with psychotic symptoms (HCC)  Secondary Diagnoses: Principal Problem:   Bipolar affective disorder, current episode manic with psychotic symptoms (HCC) Active Problems:   Acute psychosis (HCC)   Current Medications:  Current Facility-Administered Medications  Medication Dose Route Frequency Provider Last Rate Last Admin  . acetaminophen (TYLENOL) tablet 650 mg  650 mg Oral Q6H PRN Clapacs, Jackquline Denmark, MD   650 mg at 04/20/20 1037  . alum & mag hydroxide-simeth (MAALOX/MYLANTA) 200-200-20 MG/5ML suspension 30 mL  30 mL Oral Q4H PRN Clapacs, John T, MD      . benztropine (COGENTIN) tablet 1 mg  1 mg Oral BID Clapacs, John T, MD   1 mg at 04/20/20 1038  . haloperidol (HALDOL) tablet 15 mg  15 mg Oral QHS Roselind Messier, MD      . lithium carbonate (LITHOBID) CR tablet 300 mg  300 mg Oral Daily Clapacs, John T, MD   300 mg at 04/20/20 1038  . lithium carbonate (LITHOBID) CR tablet 600 mg  600 mg Oral QHS Clapacs, John T, MD   600 mg at 04/19/20 2137  . magnesium hydroxide (MILK OF MAGNESIA) suspension 30 mL  30 mL Oral Daily PRN Clapacs, John T, MD      . QUEtiapine (SEROQUEL) tablet 75 mg  75 mg Oral QHS Roselind Messier, MD       PTA Medications: Medications Prior to Admission  Medication Sig Dispense Refill Last Dose  . loratadine (CLARITIN) 10 MG tablet Take 1 tablet (10 mg total) by mouth daily. 90 tablet 2   . Multiple Vitamin (MULTIVITAMIN) tablet Take 1 tablet by mouth every other day.       Patient Stressors:    Patient Strengths:    Treatment Modalities: Medication Management, Group therapy, Case management,  1 to 1 session with clinician, Psychoeducation, Recreational therapy.   Physician Treatment Plan for Primary Diagnosis: Bipolar affective disorder, current episode manic with  psychotic symptoms (HCC) Long Term Goal(s): Improvement in symptoms so as ready for discharge Improvement in symptoms so as ready for discharge   Short Term Goals: Ability to disclose and discuss suicidal ideas Ability to demonstrate self-control will improve Ability to identify and develop effective coping behaviors will improve Ability to maintain clinical measurements within normal limits will improve Compliance with prescribed medications will improve  Medication Management: Evaluate patient's response, side effects, and tolerance of medication regimen.  Therapeutic Interventions: 1 to 1 sessions, Unit Group sessions and Medication administration.  Evaluation of Outcomes: Progressing  Physician Treatment Plan for Secondary Diagnosis: Principal Problem:   Bipolar affective disorder, current episode manic with psychotic symptoms (HCC) Active Problems:   Acute psychosis (HCC)  Long Term Goal(s): Improvement in symptoms so as ready for discharge Improvement in symptoms so as ready for discharge   Short Term Goals: Ability to disclose and discuss suicidal ideas Ability to demonstrate self-control will improve Ability to identify and develop effective coping behaviors will improve Ability to maintain clinical measurements within normal limits will improve Compliance with prescribed medications will improve     Medication Management: Evaluate patient's response, side effects, and tolerance of medication regimen.  Therapeutic Interventions: 1 to 1 sessions, Unit Group sessions and Medication administration.  Evaluation of Outcomes: Progressing   RN Treatment Plan for Primary Diagnosis: Bipolar affective disorder, current episode manic with psychotic symptoms (HCC) Long Term Goal(s): Knowledge  of disease and therapeutic regimen to maintain health will improve  Short Term Goals: Ability to verbalize frustration and anger appropriately will improve, Ability to demonstrate  self-control, Ability to verbalize feelings will improve, Ability to disclose and discuss suicidal ideas, Ability to identify and develop effective coping behaviors will improve and Compliance with prescribed medications will improve  Medication Management: RN will administer medications as ordered by provider, will assess and evaluate patient's response and provide education to patient for prescribed medication. RN will report any adverse and/or side effects to prescribing provider.  Therapeutic Interventions: 1 on 1 counseling sessions, Psychoeducation, Medication administration, Evaluate responses to treatment, Monitor vital signs and CBGs as ordered, Perform/monitor CIWA, COWS, AIMS and Fall Risk screenings as ordered, Perform wound care treatments as ordered.  Evaluation of Outcomes: Progressing   LCSW Treatment Plan for Primary Diagnosis: Bipolar affective disorder, current episode manic with psychotic symptoms (HCC) Long Term Goal(s): Safe transition to appropriate next level of care at discharge, Engage patient in therapeutic group addressing interpersonal concerns.  Short Term Goals: Engage patient in aftercare planning with referrals and resources, Increase social support, Increase ability to appropriately verbalize feelings, Increase emotional regulation, Facilitate acceptance of mental health diagnosis and concerns, Identify triggers associated with mental health/substance abuse issues and Increase skills for wellness and recovery  Therapeutic Interventions: Assess for all discharge needs, 1 to 1 time with Social worker, Explore available resources and support systems, Assess for adequacy in community support network, Educate family and significant other(s) on suicide prevention, Complete Psychosocial Assessment, Interpersonal group therapy.  Evaluation of Outcomes: Progressing   Progress in Treatment: Attending groups: Yes. Participating in groups: Yes. Taking medication as  prescribed: Yes. Toleration medication: Yes. and No. Family/Significant other contact made: No, will contact:  Pt declined SPE. Pt has permitted MD to consult with husband. Patient understands diagnosis: Yes. Discussing patient identified problems/goals with staff: Yes. Medical problems stabilized or resolved: Yes. Denies suicidal/homicidal ideation: Yes. Issues/concerns per patient self-inventory: No. Other: None  New problem(s) identified: No, Describe:  None.  New Short Term/Long Term Goal(s): Detox, elimination of psychosis and suicidal thoughts, medication management, discharge plan    Patient Goals:  Pt did not attend. No updated goal obtained.  Discharge Plan or Barriers: Patient reports plans to return home and continue aftercare treatment with current mental health providers at [facility]. Patient remains manic and paranoid preventing a discharge at this time.  CSW will continue to assess for patient's needs. MD scheduled consultation with pt and husband to determine support at time of discharge.   Reason for Continuation of Hospitalization: Anxiety Depression Mania Medication stabilization Suicidal ideation  Estimated Length of Stay: 1-2 days  Attendees: Patient: Pt did not attend. 04/20/2020 2:53 PM  Physician: Dr. Smith Robert, MD 04/20/2020 2:53 PM  Nursing:  04/20/2020 2:53 PM  RN Care Manager: 04/20/2020 2:53 PM  Social Worker: Susa Simmonds Adalberto Ill LCSW 04/20/2020 2:53 PM  Recreational Therapist:  04/20/2020 2:53 PM  Other:  04/20/2020 2:53 PM  Other:  04/20/2020 2:53 PM  Other: 04/20/2020 2:53 PM    Scribe for Treatment Team: Leisa Lenz, LCSW 04/20/2020 2:53 PM

## 2020-04-20 NOTE — Progress Notes (Signed)
[facility] MD Progress Note  04/20/2020 11:02 AM Tanya Simpson  MRN:  [id number] Subjective:    She wants to go home  Principal Problem: Bipolar affective disorder, current episode manic with psychotic symptoms (HCC) Diagnosis: Principal Problem:   Bipolar affective disorder, current episode manic with psychotic symptoms (HCC) Active Problems:   Acute psychosis (HCC)  Total Time spent with patient:  Up to fifteen  Past Psychiatric History:   Past Medical History:  Past Medical History:  Diagnosis Date  . Allergic rhinitis   . Anxiety   . Depression   . Schizophrenia [facility])     Past Surgical History:  Procedure Laterality Date  . TONSILLECTOMY     Family History:  Family History  Problem Relation Age of Onset  . Hypertension Mother   . Impulse control disorder Mother   . CVA Father   . Depression Father    Family Psychiatric  History:  Social History:  Social History   Substance and Sexual Activity  Alcohol Use Yes  . Alcohol/week: 1.0 standard drink  . Types: 1 Glasses of wine per week   Comment: very little     Social History   Substance and Sexual Activity  Drug Use No    Social History   Socioeconomic History  . Marital status: Married    Spouse name: [name]  . Number of children: 2  . Years of education: 16  . Highest education level: Bachelor's degree (e.g., BA, AB, BS)  Occupational History  . Occupation: [employer]  Tobacco Use  . Smoking status: Former Smoker    Packs/day: 1.00    Years: 1.00    Pack years: 1.00  . Smokeless tobacco: Never Used  Vaping Use  . Vaping Use: Never used  Substance and Sexual Activity  . Alcohol use: Yes    Alcohol/week: 1.0 standard drink    Types: 1 Glasses of wine per week    Comment: very little  . Drug use: No  . Sexual activity: Yes    Partners: Male  Other Topics Concern  . Not on file  Social History Narrative   Patient lives with her husband here in [place], his name is [name].    Patient works as a  Holiday representative at [employer].    Patient is recently from [place].       Hopeful to get back to exercising once the weather cools down.    She was participating in [employer], however the pandemic has made this difficult for her.    Social Determinants of Health   Financial Resource Strain: Low Risk   . Difficulty of Paying Living Expenses: Not hard at all  Food Insecurity: Food Insecurity Present  . Worried About Programme researcher, broadcasting/film/video in the Last Year: Sometimes true  . Ran Out of Food in the Last Year: Sometimes true  Transportation Needs: No Transportation Needs  . Lack of Transportation (Medical): No  . Lack of Transportation (Non-Medical): No  Physical Activity: Insufficiently Active  . Days of Exercise per Week: 7 days  . Minutes of Exercise per Session: 10 min  Stress: Stress Concern Present  . Feeling of Stress : To some extent  Social Connections: Moderately Isolated  . Frequency of Communication with Friends and Family: Once a week  . Frequency of Social Gatherings with Friends and Family: Never  . Attends Religious Services: More than 4 times per year  . Active Member of Clubs or Organizations: No  . Attends Club or  Organization Meetings: Never  . Marital Status: Married   She seeks discharge soon  Conf with husband in am   Haldol reduced to 15 qhs Seroquel reduced to 75 She feels too groggy in am   Basically wants --to --go home   MS   Alert cooperative oriented to person place and time Not clouded or fluctuant Mood about the same less manic No active SI HI or plans Contracts for safety Judgement insight reliability fair but improving No other new impulsive issues  Most likely will discharge when SW helps with disposition and all  Recommended Day treatment via Medicare as step down                          Current Medications: Current Facility-Administered Medications  Medication Dose Route Frequency Provider Last Rate Last Admin  .  acetaminophen (TYLENOL) tablet 650 mg  650 mg Oral Q6H PRN Clapacs, Jackquline Denmark, MD   650 mg at 04/20/20 1037  . alum & mag hydroxide-simeth (MAALOX/MYLANTA) 200-200-20 MG/5ML suspension 30 mL  30 mL Oral Q4H PRN Clapacs, John T, MD      . benztropine (COGENTIN) tablet 1 mg  1 mg Oral BID Clapacs, John T, MD   1 mg at 04/20/20 1038  . haloperidol (HALDOL) tablet 15 mg  15 mg Oral QHS Roselind Messier, MD      . lithium carbonate (LITHOBID) CR tablet 300 mg  300 mg Oral Daily Clapacs, John T, MD   300 mg at 04/20/20 1038  . lithium carbonate (LITHOBID) CR tablet 600 mg  600 mg Oral QHS Clapacs, John T, MD   600 mg at 04/19/20 2137  . magnesium hydroxide (MILK OF MAGNESIA) suspension 30 mL  30 mL Oral Daily PRN Clapacs, John T, MD      . QUEtiapine (SEROQUEL) tablet 75 mg  75 mg Oral QHS Roselind Messier, MD        Lab Results: No results found for this or any previous visit (from the past 48 hour(s)).  Blood Alcohol level:  Lab Results  Component Value Date   ETH <10 03/28/2020    Metabolic Disorder Labs: Lab Results  Component Value Date   HGBA1C 5.4 04/03/2020   MPG 108 04/03/2020   No results found for: PROLACTIN Lab Results  Component Value Date   CHOL 206 (H) 04/03/2020   TRIG 91 04/03/2020   HDL 62 04/03/2020   CHOLHDL 3.3 04/03/2020   VLDL 18 04/03/2020   LDLCALC 126 (H) 04/03/2020   LDLCALC 107 (H) 03/26/2020    Physical Findings: AIMS:  , ,  ,  ,    CIWA:    COWS:     Musculoskeletal: Strength & Muscle Tone: okay  Gait & Station:  No new change  Patient leans:   Psychiatric Specialty Exam: Physical Exam  Review of Systems  Blood pressure 120/62, pulse 78, temperature 97.9 F (36.6 C), temperature source Oral, resp. rate 16, height 5\' 3"  (1.6 m), weight 66.7 kg, SpO2 100 %.Body mass index is 26.04 kg/m.                                                         Treatment Plan Summary:  Possible discharge in am or soon with family  meeting and SW  meeting     Roselind Messier, MD 04/20/2020, 11:02 AM

## 2020-04-20 NOTE — Plan of Care (Signed)
  Problem: Education: Goal: Ability to make informed decisions regarding treatment will improve Outcome: Progressing   Problem: Coping: Goal: Coping ability will improve Outcome: Progressing   Problem: Health Behavior/Discharge Planning: Goal: Identification of resources available to assist in meeting health care needs will improve Outcome: Progressing   Problem: Medication: Goal: Compliance with prescribed medication regimen will improve Outcome: Progressing   

## 2020-04-20 NOTE — Progress Notes (Signed)
Patient has been anxious and pleasant and cooperative. Denies SI, HI and AVH

## 2020-04-20 NOTE — Plan of Care (Signed)
D- Patient alert and oriented. Patient presented in an anxious, but pleasant mood on assessment. Patient reported "I didn't have a good night, I think they're giving me too much meds at night. I'm a little chubby, but I'm short, I don't think I need all of that medicine". Patient rated her pain a "5/10", in which she requested medication from this Clinical research associate. Patient also denied SI, HI, AVH at this time. Patient had no stated goals for today. Patient wasn't talking much, just preoccupied with the doctor adjusting her night-time medication.   A- Scheduled medications administered to patient, per MD orders. Support and encouragement provided.  Routine safety checks conducted every 15 minutes.  Patient informed to notify staff with problems or concerns.  R- No adverse drug reactions noted. Patient contracts for safety at this time. Patient compliant with medications and treatment plan. Patient receptive, calm, and cooperative. Patient interacts well with others on the unit.  Patient remains safe at this time.  Problem: Education: Goal: Ability to make informed decisions regarding treatment will improve Outcome: Progressing   Problem: Coping: Goal: Coping ability will improve Outcome: Progressing   Problem: Health Behavior/Discharge Planning: Goal: Identification of resources available to assist in meeting health care needs will improve Outcome: Progressing   Problem: Medication: Goal: Compliance with prescribed medication regimen will improve Outcome: Progressing   Problem: Self-Concept: Goal: Ability to disclose and discuss suicidal ideas will improve Outcome: Progressing Goal: Will verbalize positive feelings about self Outcome: Progressing   Problem: Education: Goal: Knowledge of [place] [facility] Education information/materials will improve Outcome: Progressing Goal: Emotional status will improve Outcome: Progressing Goal: Mental status will improve Outcome: Progressing Goal:  Verbalization of understanding the information provided will improve Outcome: Progressing   Problem: Activity: Goal: Interest or engagement in activities will improve Outcome: Progressing Goal: Sleeping patterns will improve Outcome: Progressing   Problem: Coping: Goal: Ability to verbalize frustrations and anger appropriately will improve Outcome: Progressing Goal: Ability to demonstrate self-control will improve Outcome: Progressing   Problem: Health Behavior/Discharge Planning: Goal: Identification of resources available to assist in meeting health care needs will improve Outcome: Progressing Goal: Compliance with treatment plan for underlying cause of condition will improve Outcome: Progressing   Problem: Physical Regulation: Goal: Ability to maintain clinical measurements within normal limits will improve Outcome: Progressing   Problem: Safety: Goal: Periods of time without injury will increase Outcome: Progressing

## 2020-04-21 MED ORDER — QUETIAPINE FUMARATE 25 MG PO TABS: 75.0000 mg | ORAL_TABLET | Freq: Every day | ORAL | 1 refills | Status: DC

## 2020-04-21 MED ORDER — BENZTROPINE MESYLATE 1 MG PO TABS: 1.0000 mg | ORAL_TABLET | Freq: Two times a day (BID) | ORAL | 1 refills | Status: DC

## 2020-04-21 MED ORDER — LITHIUM CARBONATE ER 300 MG PO TBCR: 300.0000 mg | EXTENDED_RELEASE_TABLET | Freq: Every day | ORAL | 1 refills | Status: DC

## 2020-04-21 MED ORDER — HALOPERIDOL 5 MG PO TABS: 15.0000 mg | ORAL_TABLET | Freq: Every day | ORAL | 1 refills | Status: DC

## 2020-04-21 MED ORDER — LITHIUM CARBONATE ER 300 MG PO TBCR: 600.0000 mg | EXTENDED_RELEASE_TABLET | Freq: Every day | ORAL | 1 refills | Status: DC

## 2020-04-21 NOTE — Plan of Care (Signed)
  Problem: Education: Goal: Ability to make informed decisions regarding treatment will improve Outcome: Progressing   Problem: Coping: Goal: Coping ability will improve Outcome: Progressing   Problem: Health Behavior/Discharge Planning: Goal: Identification of resources available to assist in meeting health care needs will improve Outcome: Progressing   Problem: Medication: Goal: Compliance with prescribed medication regimen will improve Outcome: Progressing   

## 2020-04-21 NOTE — Progress Notes (Signed)
  [facility] Adult Case Management Discharge Plan :  Will you be returning to the same living situation after discharge:  Yes,  lives with spouse At discharge, do you have transportation home?: Yes,  pt reports husband will pick her up at 8pm Do you have the ability to pay for your medications: Yes,  Medicare  Release of information consent forms completed and in the chart;  Patient's signature needed at discharge.  Patient to Follow up at:  Follow-up Information    [facility] Follow up on 04/25/2020.   Specialty: Behavioral Health Why: You are scheduled to meet with Dr. Corie Chiquito on Friday, July 23rd at 930am. You have been placed on the cancellation list and will be contacted when an earlier appointment is available. Thank you. Contact information: [address] [place] [address] [phone]               Next level of care provider has access to [facility] Link:no  Safety Planning and Suicide Prevention discussed: Yes,  with pt; declined family contact     Has patient been referred to the Quitline?: N/A patient is not a smoker  Patient has been referred for addiction treatment: N/A  Suzan Slick, LCSW 04/21/2020, 3:18 PM

## 2020-04-21 NOTE — Plan of Care (Signed)
  Problem: Coping Skills Goal: STG - Patient will identify 3 positive coping skills strategies to use post d/c within 5 recreation therapy group sessions Description: STG - Patient will identify 3 positive coping skills strategies to use post d/c within 5 recreation therapy group sessions Outcome: Completed/Met

## 2020-04-21 NOTE — Progress Notes (Signed)
Recreation Therapy Notes  INPATIENT RECREATION TR PLAN  Patient Details Name: Tanya Simpson MRN: [id number] DOB: 01/09/1961 Today's Date: 04/21/2020  Rec Therapy Plan Is patient appropriate for Therapeutic Recreation?: Yes Treatment times per week: at least 3 Estimated Length of Stay: 5-7 days TR Treatment/Interventions: Group participation (Comment)  Discharge Criteria Pt will be discharged from therapy if:: Discharged Treatment plan/goals/alternatives discussed and agreed upon by:: Patient/family  Discharge Summary Short term goals set: Patient will identify 3 positive coping skills strategies to use post d/c within 5 recreation therapy group sessions Short term goals met: Complete Progress toward goals comments: Groups attended Which groups?: Coping skills, Social skills, Communication, Self-esteem, Other (Comment) (Necessities, Happiness, Problem Solving, Emotions) Reason goals not met: N/A Therapeutic equipment acquired: N/A Reason patient discharged from therapy: Discharge from hospital Pt/family agrees with progress & goals achieved: Yes Date patient discharged from therapy: 04/28/20   Lemoyne Nestor 04/21/2020, 2:06 PM

## 2020-04-21 NOTE — Discharge Summary (Signed)
Physician Discharge Summary Note  Patient:  Tanya Simpson is an 59 y.o., female MRN:  [id number] DOB:  Dec 25, 1960 Patient phone:  [phone] (home)  Patient address:   [address] [place] [address],    Total Time spent with patient: 1.5 hours including meeting with her and her husband and to look over paperwork and cover her issues     Date of Admission:  04/01/2020 Date of Discharge:  04/21/20  Reason for Admission:   Originally suicidal post Lamictal --OD for many adjustment issues :  Job search, self esteem, age issues, managing Dad's finances ---Now here and was admitted  Not suicidal now but long time treated to bring her down from manic episode --felt suspicious and paranoid and was not sure    She is now possibly at new baseline  During this time she worked on coping and Self esteem in groups she tried to understand what kind of job she could qualify for and how to cope with stress of hearing voices.  She also felt she was better at handling her husband and wanted to do couples' therapy ---She realizes she can manage with better support and structure at home.   She agreed to discharge today and we had family meeting with husband to explain her outpatient plan --no other side effects or medcial problems at discharge     Principal Problem: Bipolar affective disorder, current episode manic with psychotic symptoms [facility]) Discharge Diagnoses: Principal Problem:   Bipolar affective disorder, current episode manic with psychotic symptoms (HCC) Active Problems:   Acute psychosis (HCC)   Past Psychiatric History:  Previous psych admits ---several times before  Last one --2015  Similar reasons --without SI ---but had psychosis  No substance drug or ETOH issues  No court or legal issues ---  Past Medical History:  Past Medical History:  Diagnosis Date  . Allergic rhinitis   . Anxiety   . Depression   . Schizophrenia [facility])     Past Surgical History:   Procedure Laterality Date  . TONSILLECTOMY     Family History:  Family History  Problem Relation Age of Onset  . Hypertension Mother   . Impulse control disorder Mother   . CVA Father   . Depression Father    Family Psychiatric  History:   Dad with history of bipolar and psychosis and Mom with depression    Social History:  Lives with husband has a grown --son and daughter   Seven years at [employer] --looking for job now   On disability now, but wants to work 20 hours a week   Social History   Substance and Sexual Activity  Alcohol Use Yes  . Alcohol/week: 1.0 standard drink  . Types: 1 Glasses of wine per week   Comment: very little     Social History   Substance and Sexual Activity  Drug Use No    Social History   Socioeconomic History  . Marital status: Married    Spouse name: [name]  . Number of children: 2  . Years of education: 16  . Highest education level: Bachelor's degree (e.g., BA, AB, BS)  Occupational History  . Occupation: [employer]  Tobacco Use  . Smoking status: Former Smoker    Packs/day: 1.00    Years: 1.00    Pack years: 1.00  . Smokeless tobacco: Never Used  Vaping Use  . Vaping Use: Never used  Substance and Sexual Activity  . Alcohol use: Yes  Alcohol/week: 1.0 standard drink    Types: 1 Glasses of wine per week    Comment: very little  . Drug use: No  . Sexual activity: Yes    Partners: Male  Other Topics Concern  . Not on file  Social History Narrative   Patient lives with her husband here in [place], his name is [name].    Patient works as a Holiday representative at [employer].    Patient is recently from [place].       Hopeful to get back to exercising once the weather cools down.    She was participating in [employer], however the pandemic has made this difficult for her.    Social Determinants of Health   Financial Resource Strain: Low Risk   . Difficulty of Paying Living Expenses: Not hard at all  Food Insecurity:  Food Insecurity Present  . Worried About Programme researcher, broadcasting/film/video in the Last Year: Sometimes true  . Ran Out of Food in the Last Year: Sometimes true  Transportation Needs: No Transportation Needs  . Lack of Transportation (Medical): No  . Lack of Transportation (Non-Medical): No  Physical Activity: Insufficiently Active  . Days of Exercise per Week: 7 days  . Minutes of Exercise per Session: 10 min  Stress: Stress Concern Present  . Feeling of Stress : To some extent  Social Connections: Moderately Isolated  . Frequency of Communication with Friends and Family: Once a week  . Frequency of Social Gatherings with Friends and Family: Never  . Attends Religious Services: More than 4 times per year  . Active Member of Clubs or Organizations: No  . Attends Banker Meetings: Never  . Marital Status: Married    Hospital Course:    Patient was admitted and placed into milieu, SW rounds and MD rounds.  She was seen by Dr. Toni Amend most of the admission.   Her course waxed and waned but --her final medications were titrated to   Haldol 15 mg po qhs Seroquel 75 mg po qhs Lithium 300/600 daily ---level  Last level 0.82 ---drawn on the sixteenth     Physical Findings: AIMS:  , ,  ,  ,   Previously done in earily part of admission CIWA:    COWS:     Musculoskeletal: Strength & Muscle Tone: normal  Normal Gait & Station:  Normal  Patient leans:   Psychiatric Specialty Exam: Physical Exam  Review of Systems  Blood pressure 129/75, pulse 71, temperature 97.8 F (36.6 C), temperature source Oral, resp. rate 17, height 5\' 3"  (1.6 m), weight 66.7 kg, SpO2 100 %.Body mass index is 26.04 kg/m.  Mental Status     Sleep, ADL's Cognition now more normal Assets --stable family  Liability --vulnerability to breakthrough symptoms Movements --no akathisia or related    Alert cooperative oriented to person place and time  Consciousness not clouded or fluctuant Mood --still hypomanic  but might be her new baseline does not want additional meds  Affect --slightly elevated but okay ISpeech --talkative still pressured and loud Might be baseline now but outpatient can follow up  Concentration and attention --interrupts others a lot  Judgement insight reliability fair to okay Memory --remote recent and immediate okay Fund of knowledge and intelligence normal SI and HI --non contracts for safety  Abstraction normal  Rapport --at baseline, eye contact okay Thought process --still speeded and somewhat pressure Thought content --no frank psychosis or delusions illusions Less anxious as well  Has this patient used any form of tobacco in the last 30 days? (Cigarettes, Smokeless Tobacco, Cigars, and/or Pipes) Yes, no  Blood Alcohol level:  Lab Results  Component Value Date   ETH <10 03/28/2020    Metabolic Disorder Labs:  Lab Results  Component Value Date   HGBA1C 5.4 04/03/2020   MPG 108 04/03/2020   No results found for: PROLACTIN Lab Results  Component Value Date   CHOL 206 (H) 04/03/2020   TRIG 91 04/03/2020   HDL 62 04/03/2020   CHOLHDL 3.3 04/03/2020   VLDL 18 04/03/2020   LDLCALC 126 (H) 04/03/2020   LDLCALC 107 (H) 03/26/2020    See Psychiatric Specialty Exam and Suicide Risk Assessment completed by Attending Physician prior to discharge.  Discharge destination:  Home to husband and then day treatment locally with local med mgt   Is patient on multiple antipsychotic therapies at discharge:  Yes    Has Patient had three or more failed trials of antipsychotic monotherapy by history:  YES   Recommended Plan for Multiple Antipsychotic Therapies:  Continue as planned      Follow-up Information    [facility] Follow up on 04/25/2020.   Specialty: Behavioral Health Why: You are scheduled to meet with Dr. Corie Chiquito on Friday, July 23rd at 930am. You have  been placed on the cancellation list and will be contacted when an earlier appointment is available. Thank you. Contact information: [address] [place] [address] [phone]               Follow-up recommendations:    Discharge -  Today to husband, followup with day treatment / then women's groups, service works, Religious activities, gardening hobbies and job search --outpatient med mgt and continue meds     Comments:      She knows risks and benefits of meds including TD and agrees to continue      Signed: Roselind Messier, MD 04/21/2020, 11:07 AM

## 2020-04-21 NOTE — Progress Notes (Signed)
Patient ID: Tanya Simpson, female   DOB: 1960-12-27, 59 y.o.   MRN: [id number] Gave patient her hs medications early per her request. Reviewed patient's discharge instructions and went over her prescriptions. Gave patient information on partial hospitalization per her request. Patient denies SI, HI and AVH and contracts for safety. All patient's belongings returned. Voices no complaints. Verbalizes understanding of discharge instructions.

## 2020-04-21 NOTE — BHH Counselor (Addendum)
Pt is scheduled for discharge today. CSW discussed with pt discharge plan and informed her she will need to speak with Dr. Montez Morita on 7/23 to receive referral for an in house therapist at [facility]. Pt states she is agreeable to do this. Pt reports she spoke with physician who suggest that she be referred to a day treatment program. CSW provided pt with information on psychosocial rehabilitation programs([facility] and [place] . Pt says she is agreeable to participate in [facility]. Pt later came to CSW and states she no longer wants to participate in [facility] program. Pt states she would like to find employment. CSW encouraged pt to contact PSR programs should she change her mind.

## 2020-04-21 NOTE — Progress Notes (Signed)
Patient has been pleasant and cooperative. Denies SI, HI and AVH. Wanting to be discharged. Pleased with the changes made to her medications.

## 2020-04-21 NOTE — Progress Notes (Signed)
D- Patient alert and oriented. Patient presents in an anxious, but pleasant mood on assessment stating that she slept better last night and had no complaints to voice to this Clinical research associate. Patient denies SI, HI, AVH, and pain at this time. Patient also denies any signs/symptoms of depression/anxiety stating "I feel better". Patient's goal for today is "having a conference call with my husband and doctor".  A- Scheduled medications administered to patient, per MD orders. Support and encouragement provided.  Routine safety checks conducted every 15 minutes.  Patient informed to notify staff with problems or concerns.  R- No adverse drug reactions noted. Patient contracts for safety at this time. Patient compliant with medications and treatment plan. Patient receptive, calm, and cooperative. Patient interacts well with others on the unit.  Patient remains safe at this time.

## 2020-04-21 NOTE — Progress Notes (Signed)
Recreation Therapy Notes   Date: 04/21/2020  Time: 9:30 am   Location: Craft room     Behavioral response: N/A   Intervention Topic: Stress Management   Discussion/Intervention: Patient did not attend group.   Clinical Observations/Feedback:  Patient did not attend group.   Tanya Simpson LRT/CTRS        Yvaine Jankowiak 04/21/2020 11:48 AM

## 2020-04-21 NOTE — [facility] Suicide Risk Assessment (Signed)
[facility] Discharge Suicide Risk Assessment   Principal Problem: Bipolar affective disorder, current episode manic with psychotic symptoms (HCC) Discharge Diagnoses: Principal Problem:   Bipolar affective disorder, current episode manic with psychotic symptoms (HCC) Active Problems:   Acute psychosis (HCC)   Total Time spent with patient:  More than one hour  Musculoskeletal: Strength & Muscle Tone: normal  Gait & Station: normal  Patient leans:   Psychiatric Specialty Exam: Review of Systems  Blood pressure 129/75, pulse 71, temperature 97.8 F (36.6 C), temperature source Oral, resp. rate 17, height 5\' 3"  (1.6 m), weight 66.7 kg, SpO2 100 %.Body mass index is 26.04 kg/m.  Mental Status  Already written today in the discharge summary  No need to repeat the same thing here                                                      Mental Status Per Nursing Assessment::   On Admission:  Self-harm thoughts, Self-harm behaviors Active   Now she is safe and contracts  Demographic Factors:  Job issues feeling useful  Improved   Loss Factors: None   Historical Factors:  mjob issues managing dad's finances   Risk Reduction Factors:   Better meds and coping, better social skill tools  Referral to day treatment for structure   Continued Clinical Symptoms:  Hypomanic at baseline no psychosis and the rest can be followed as an outpatient  Cognitive Features That Contribute To Risk:   Speeded thoughts may change her thinking     Suicide Risk:   low at discharge    Follow-up Information    [facility] up on 04/25/2020.   Specialty: Behavioral Health Why: You are scheduled to meet with Dr. 04/27/2020 on Friday, July 23rd at 930am. You have been placed on the cancellation list and will be contacted when an earlier appointment is available. Thank you. Contact information: [address] [place]  [place] [phone]               Plan Of Care/Follow-up recommendations:   Day Treatment/ Med mgt  Couple's therapy  Women's group  Social work and voluntary work  [phone] counseling   Hydrologist  CODA meetings     [employer], MD 04/21/2020, 11:43 AM

## 2020-04-23 ENCOUNTER — Telehealth (HOSPITAL_COMMUNITY): Payer: Self-pay | Admitting: Professional

## 2020-04-25 ENCOUNTER — Other Ambulatory Visit: Payer: Self-pay

## 2020-04-25 ENCOUNTER — Encounter: Payer: Self-pay | Admitting: Psychiatry

## 2020-04-25 ENCOUNTER — Ambulatory Visit (INDEPENDENT_AMBULATORY_CARE_PROVIDER_SITE_OTHER): Payer: Medicare Other | Admitting: Psychiatry

## 2020-04-25 VITALS — BP 124/80 | HR 74

## 2020-04-25 DIAGNOSIS — F3161 Bipolar disorder, current episode mixed, mild: Secondary | ICD-10-CM | POA: Diagnosis not present

## 2020-04-25 DIAGNOSIS — F23 Brief psychotic disorder: Secondary | ICD-10-CM | POA: Diagnosis not present

## 2020-04-25 MED ORDER — LITHIUM CARBONATE ER 300 MG PO TBCR: EXTENDED_RELEASE_TABLET | ORAL | 0 refills | Status: DC

## 2020-04-25 MED ORDER — QUETIAPINE FUMARATE 25 MG PO TABS: 75.0000 mg | ORAL_TABLET | Freq: Every day | ORAL | 1 refills | Status: DC

## 2020-04-25 NOTE — Progress Notes (Signed)
Tanya Simpson [id number] 25-Jul-1961 59 y.o.  Subjective:   Patient ID:  Tanya Simpson is a 59 y.o. (DOB December 25, 1960) female.  Chief Complaint:  Chief Complaint  Patient presents with  . Hospitalization Follow-up    HPI Tanya Simpson presents to the office today for follow-up after hospital discharge following overdose. She reports that she was paranoid prior to hospitalization. She reports that she has not taken Seroquel this week because she did not think she had a prescription. Reports that she slept well last night. She reports that she has been sleeping ok. She reports that Lithium is making her slightly tired. She reports that her mood is stable currently. Denies current depressed mood. Denies irritability. She reports feeling "tired and relaxed." Denies anxiety other than some apprehension about husband returning to work and being alone. Denies paranoia. Motivation has been good. She reports poor concentration and focus. Denies SI.   Denies hallucinations.   Has apt for peer support with  [facility].    AIMS     Office Visit from 03/25/2020 in [facility] Office Visit from 01/17/2020 in [facility] Office Visit from 11/09/2019 in [facility] Office Visit from 04/18/2019 in [facility]  AIMS Total Score 0 1 2 0    GAD-7     Office Visit from 04/20/2018 in [facility] Office Visit from 03/28/2018 in [facility] Office Visit from 03/14/2018 in [facility] Office Visit from 02/28/2018 in [facility] Office Visit from 10/11/2017 in [facility]  Total GAD-7 Score 3 3 1 3 3     PHQ2-9     Office Visit from 03/26/2020 in [facility] Office Visit from 05/30/2019 in [facility] Office Visit from 12/19/2018 in [facility] Office Visit from 04/20/2018 in [facility] Office Visit from 03/28/2018 in [facility]  PHQ-2 Total Score 1 0 0 1 0  PHQ-9 Total Score -- -- -- 3 2       Review of Systems:  Review of Systems  Gastrointestinal: Negative.   Musculoskeletal: Negative for gait problem.  Neurological: Negative for tremors.  Psychiatric/Behavioral:       Please refer to HPI    Medications: I have reviewed the patient's current medications.  Current Outpatient Medications  Medication Sig Dispense Refill  . benztropine (COGENTIN) 1 MG tablet Take 1 tablet (1 mg total) by mouth 2 (two) times daily. 60 tablet 1  . haloperidol (HALDOL) 5 MG tablet Take 3 tablets (15 mg total) by mouth at bedtime. 90 tablet 1  . lithium carbonate (LITHOBID) 300 MG CR tablet Take 1 tablet (300 mg total) by mouth every morning AND 2 tablets (600 mg total) at bedtime. 90 tablet 0  . QUEtiapine (SEROQUEL) 25 MG tablet Take 3 tablets (75 mg total) by mouth at bedtime. 90 tablet 1   No current facility-administered medications for this visit.    Medication Side Effects: Fatigue and Other: Increased thirst  Allergies: No Known Allergies  Past Medical History:  Diagnosis Date  . Allergic rhinitis   . Anxiety   . Depression   . Schizophrenia (HCC)     Family History  Problem Relation Age of Onset  . Hypertension Mother   . Impulse control disorder Mother   . CVA Father   . Depression Father  Social History   Socioeconomic History  . Marital status: Married    Spouse name: [name]  . Number of children: 2  . Years of education: 16  . Highest education level: Bachelor's degree (e.g., BA, AB, BS)  Occupational History  . Occupation: [employer]  Tobacco Use  . Smoking status: Former Smoker    Packs/day: 1.00    Years: 1.00    Pack years: 1.00  . Smokeless tobacco: Never Used  Vaping Use  . Vaping Use: Never used  Substance and Sexual Activity  . Alcohol use: Yes    Alcohol/week: 1.0 standard drink     Types: 1 Glasses of wine per week    Comment: very little  . Drug use: No  . Sexual activity: Yes    Partners: Male  Other Topics Concern  . Not on file  Social History Narrative   Patient lives with her husband here in [place], his name is [name].    Patient works as a Holiday representative at [employer].    Patient is recently from [place].       Hopeful to get back to exercising once the weather cools down.    She was participating in [employer], however the pandemic has made this difficult for her.    Social Determinants of Health   Financial Resource Strain: Low Risk   . Difficulty of Paying Living Expenses: Not hard at all  Food Insecurity: Food Insecurity Present  . Worried About Programme researcher, broadcasting/film/video in the Last Year: Sometimes true  . Ran Out of Food in the Last Year: Sometimes true  Transportation Needs: No Transportation Needs  . Lack of Transportation (Medical): No  . Lack of Transportation (Non-Medical): No  Physical Activity: Insufficiently Active  . Days of Exercise per Week: 7 days  . Minutes of Exercise per Session: 10 min  Stress: Stress Concern Present  . Feeling of Stress : To some extent  Social Connections: Moderately Isolated  . Frequency of Communication with Friends and Family: Once a week  . Frequency of Social Gatherings with Friends and Family: Never  . Attends Religious Services: More than 4 times per year  . Active Member of Clubs or Organizations: No  . Attends Banker Meetings: Never  . Marital Status: Married  Catering manager Violence: Not At Risk  . Fear of Current or Ex-Partner: No  . Emotionally Abused: No  . Physically Abused: No  . Sexually Abused: No    Past Medical History, Surgical history, Social history, and Family history were reviewed and updated as appropriate.   Please see review of systems for further details on the patient's review from today.   Objective:   Physical Exam:  BP 124/80   Pulse 74   Physical  Exam Constitutional:      General: She is not in acute distress. Musculoskeletal:        General: No deformity.  Neurological:     Mental Status: She is alert and oriented to person, place, and time.     Coordination: Coordination normal.  Psychiatric:        Attention and Perception: Attention and perception normal. She does not perceive auditory or visual hallucinations.        Mood and Affect: Mood normal. Mood is not anxious or depressed. Affect is not labile, blunt, angry or inappropriate.        Behavior: Behavior normal. Behavior is cooperative.        Thought Content:  Thought content normal. Thought content is not paranoid or delusional. Thought content does not include homicidal or suicidal ideation. Thought content does not include homicidal or suicidal plan.        Cognition and Memory: Cognition and memory normal.        Judgment: Judgment normal.     Comments: Insight intact Speech is rapid; not pressured     Lab Review:     Component Value Date/Time   NA 135 04/01/2020 0348   NA 136 03/26/2020 1226   K 3.9 04/01/2020 0348   CL 101 04/01/2020 0348   CO2 27 04/01/2020 0348   GLUCOSE 105 (H) 04/01/2020 0348   BUN 7 04/01/2020 0348   BUN 10 03/26/2020 1226   CREATININE 0.65 04/01/2020 0348   CALCIUM 8.9 04/01/2020 0348   PROT 6.9 04/01/2020 0348   PROT 7.7 03/26/2020 1226   ALBUMIN 3.7 04/01/2020 0348   ALBUMIN 4.7 03/26/2020 1226   AST 34 04/01/2020 0348   ALT 34 04/01/2020 0348   ALKPHOS 65 04/01/2020 0348   BILITOT 0.5 04/01/2020 0348   BILITOT 0.3 03/26/2020 1226   GFRNONAA >60 04/01/2020 0348   GFRAA >60 04/01/2020 0348       Component Value Date/Time   WBC 10.7 (H) 04/01/2020 0348   RBC 4.01 04/01/2020 0348   HGB 12.5 04/01/2020 0348   HGB 13.5 03/26/2020 1226   HCT 37.4 04/01/2020 0348   HCT 40.6 03/26/2020 1226   PLT 331 04/01/2020 0348   PLT 399 03/26/2020 1226   MCV 93.3 04/01/2020 0348   MCV 91 03/26/2020 1226   MCH 31.2 04/01/2020 0348    MCHC 33.4 04/01/2020 0348   RDW 12.2 04/01/2020 0348   RDW 11.8 03/26/2020 1226   LYMPHSABS 0.8 03/28/2020 1646   MONOABS 0.8 03/28/2020 1646   EOSABS 0.0 03/28/2020 1646   BASOSABS 0.0 03/28/2020 1646    Lithium Lvl  Date Value Ref Range Status  04/18/2020 0.82 0.60 - 1.20 mmol/L Final    Comment:    Performed at [facility], [address] [place], [place] [address]     No results found for: PHENYTOIN, PHENOBARB, VALPROATE, CBMZ   .res Assessment: Plan:   Recommend continuing possible discharge medications since patient reports that paranoia has resolved and mood has improved.  Discussed restarting Seroquel since this will likely be helpful for her insomnia and anxiety.  Will change lithium prescription to 300 mg one tab po q am and two tabs po QHS since pt reports that she currently has 2 separate prescriptions, 1 for a.m. dosing and another prescription for p.m. dosing. Will continue Haldol 15 mg at bedtime for psychosis. Discussed plan to improve community support and therapy.  Patient assisted with scheduling appointment with Marliss Czar, PhD.  Patient reports that she may be interested in PHP/IOP, however she is concerned about possible out-of-pocket cost.  Recommended that she contact program to request preauthorization in estimate of what her out-of-pocket cost may be.  Also discussed services offered through [facility] and reviewed [facility] website with patient so that she could contact [facility] and find services that would be helpful for her. Patient to follow-up with this provider in 4 weeks or sooner if clinically indicated. Patient advised to contact office with any questions, adverse effects, or acute worsening in signs and symptoms.  Tanya Simpson was seen today for hospitalization follow-up.  Diagnoses and all orders for this visit:  Acute psychosis (HCC) -     Discontinue: lithium carbonate (LITHOBID)  300 MG CR tablet; Take 1 tablet (300 mg total) by mouth every morning AND  2 tablets (600 mg total) at bedtime. -     QUEtiapine (SEROQUEL) 25 MG tablet; Take 3 tablets (75 mg total) by mouth at bedtime. -     lithium carbonate (LITHOBID) 300 MG CR tablet; Take 1 tablet (300 mg total) by mouth every morning AND 2 tablets (600 mg total) at bedtime.  Bipolar disorder, current episode mixed, mild (HCC) -     Discontinue: lithium carbonate (LITHOBID) 300 MG CR tablet; Take 1 tablet (300 mg total) by mouth every morning AND 2 tablets (600 mg total) at bedtime. -     QUEtiapine (SEROQUEL) 25 MG tablet; Take 3 tablets (75 mg total) by mouth at bedtime. -     lithium carbonate (LITHOBID) 300 MG CR tablet; Take 1 tablet (300 mg total) by mouth every morning AND 2 tablets (600 mg total) at bedtime.     Please see After Visit Summary for patient specific instructions.  Future Appointments  Date Time Provider Department Center  05/12/2020  2:20 PM [facility] 2 [facility]  05/23/2020 10:00 AM Corie Chiquitoarter, Philamena Kramar, PMHNP [facility] None  05/26/2020  1:00 PM Mitchum, Molly Maduroobert, PhD [facility] None    No orders of the defined types were placed in this encounter.   -------------------------------

## 2020-04-28 ENCOUNTER — Telehealth (HOSPITAL_COMMUNITY): Payer: Self-pay | Admitting: Professional

## 2020-05-01 ENCOUNTER — Telehealth (HOSPITAL_COMMUNITY): Payer: Self-pay | Admitting: Professional

## 2020-05-05 ENCOUNTER — Other Ambulatory Visit: Payer: Self-pay

## 2020-05-05 ENCOUNTER — Other Ambulatory Visit (HOSPITAL_COMMUNITY): Payer: Medicare Other | Attending: Psychiatry | Admitting: Licensed Clinical Social Worker

## 2020-05-05 DIAGNOSIS — F312 Bipolar disorder, current episode manic severe with psychotic features: Secondary | ICD-10-CM | POA: Diagnosis not present
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
## 2020-05-07 ENCOUNTER — Other Ambulatory Visit (HOSPITAL_COMMUNITY): Payer: Medicare Other

## 2020-05-07 ENCOUNTER — Other Ambulatory Visit: Payer: Self-pay

## 2020-05-07 ENCOUNTER — Other Ambulatory Visit (HOSPITAL_COMMUNITY): Payer: Medicare Other | Admitting: Family

## 2020-05-08 ENCOUNTER — Other Ambulatory Visit: Payer: Self-pay

## 2020-05-08 ENCOUNTER — Other Ambulatory Visit (HOSPITAL_COMMUNITY): Payer: Medicare Other

## 2020-05-08 ENCOUNTER — Telehealth (HOSPITAL_COMMUNITY): Payer: Self-pay | Admitting: Professional

## 2020-05-09 ENCOUNTER — Ambulatory Visit (HOSPITAL_COMMUNITY): Payer: Medicare Other

## 2020-05-12 ENCOUNTER — Ambulatory Visit: Payer: Medicare Other

## 2020-05-12 ENCOUNTER — Ambulatory Visit (HOSPITAL_COMMUNITY): Payer: Medicare Other

## 2020-05-13 ENCOUNTER — Other Ambulatory Visit (HOSPITAL_COMMUNITY): Payer: Medicare Other

## 2020-05-13 ENCOUNTER — Ambulatory Visit (HOSPITAL_COMMUNITY): Payer: Medicare Other

## 2020-05-14 ENCOUNTER — Ambulatory Visit (HOSPITAL_COMMUNITY): Payer: Medicare Other

## 2020-05-14 ENCOUNTER — Other Ambulatory Visit (HOSPITAL_COMMUNITY): Payer: Medicare Other

## 2020-05-15 ENCOUNTER — Ambulatory Visit (HOSPITAL_COMMUNITY): Payer: Medicare Other

## 2020-05-15 ENCOUNTER — Other Ambulatory Visit (HOSPITAL_COMMUNITY): Payer: Medicare Other

## 2020-05-16 ENCOUNTER — Ambulatory Visit (HOSPITAL_COMMUNITY): Payer: Medicare Other

## 2020-05-19 ENCOUNTER — Ambulatory Visit (HOSPITAL_COMMUNITY): Payer: Medicare Other

## 2020-05-20 ENCOUNTER — Ambulatory Visit (HOSPITAL_COMMUNITY): Payer: Medicare Other

## 2020-05-23 ENCOUNTER — Ambulatory Visit (INDEPENDENT_AMBULATORY_CARE_PROVIDER_SITE_OTHER): Payer: Medicare Other | Admitting: Psychiatry

## 2020-05-23 ENCOUNTER — Encounter: Payer: Self-pay | Admitting: Psychiatry

## 2020-05-23 ENCOUNTER — Other Ambulatory Visit: Payer: Self-pay

## 2020-05-23 DIAGNOSIS — Z79899 Other long term (current) drug therapy: Secondary | ICD-10-CM

## 2020-05-23 DIAGNOSIS — F3161 Bipolar disorder, current episode mixed, mild: Secondary | ICD-10-CM | POA: Diagnosis not present

## 2020-05-23 DIAGNOSIS — F23 Brief psychotic disorder: Secondary | ICD-10-CM | POA: Diagnosis not present

## 2020-05-23 MED ORDER — QUETIAPINE FUMARATE 25 MG PO TABS: 75.0000 mg | ORAL_TABLET | Freq: Every day | ORAL | 1 refills | Status: DC

## 2020-05-23 MED ORDER — HALOPERIDOL 5 MG PO TABS: 15.0000 mg | ORAL_TABLET | Freq: Every day | ORAL | 1 refills | Status: DC

## 2020-05-23 MED ORDER — BENZTROPINE MESYLATE 1 MG PO TABS: 1.0000 mg | ORAL_TABLET | Freq: Two times a day (BID) | ORAL | 1 refills | Status: DC

## 2020-05-23 MED ORDER — LITHIUM CARBONATE ER 300 MG PO TBCR: EXTENDED_RELEASE_TABLET | ORAL | 1 refills | Status: DC

## 2020-05-23 NOTE — Progress Notes (Signed)
Tanya Sermonserri Rask [id number] 25-Nov-1960 59 y.o.  Subjective:   Patient ID:  Tanya Simpson is a 59 y.o. (DOB 25-Nov-1960) female.  Chief Complaint:  Chief Complaint  Patient presents with   Follow-up    HPI Tanya Sermonserri Tardiff presents to the office today for follow-up of mood disturbance. She reports that her anxiety has been "better. Still a little nervous, but better." Denies depressed mood. Denies irritability. Denies elevated mood. Denies any risky or impulsive behavior. She reports that she would like to have more to do. Energy and motivation have been ok. She reports adequate concentration. Has been sleeping well. Appetite has been normal. Denies SI- "I would never do that again." Reports feeling that people from the past may be "messing with" her.  Reports that her husband has commented that her current medications are helpful and does not want her to make any changes.   Has been [facility]. Has established a peer support relationship and has been attending some support groups. Reports that she has had difficulty setting up outpatient program. Has an apt next week to see Marliss CzarAndy Mitchum, PhD. Reports that she recently went back to [employer], her previously place of employment, and this went well. Planning to visit family up [place]. Reading a Floyce StakesJoyce Meyers journal and enjoying this.    AIMS     Office Visit from 03/25/2020 in [facility] Office Visit from 01/17/2020 in [facility] Office Visit from 11/09/2019 in [facility] Office Visit from 04/18/2019 in [facility]  AIMS Total Score 0 1 2 0    GAD-7     [facility] Office [facility] from [facility]2018 [facility] from [facility]2018 [facility] from [facility]2018 [facility] from [facility]/2017 [facility]   Total GAD-7 Score 3 3 1 3 3     PHQ2-9     [facility] from [facility]2019 [facility] from [facility]/2018 [facility] from [facility]2018 [facility]  PHQ-2 Total Score 1 0 0 1 0  PHQ-9 Total Score -- -- -- 3 2       Review of Systems:  Review of Systems  Constitutional:       Possible thinning hair  Eyes: Positive for visual disturbance.  Gastrointestinal: Negative.   Musculoskeletal: Negative for gait problem.  Neurological: Negative for tremors.  Psychiatric/Behavioral:       Please refer to HPI    Medications: I have reviewed the patient's current medications.  Current Outpatient Medications  Medication Sig Dispense Refill   benztropine (COGENTIN) 1 MG tablet Take 1 tablet (1 mg total) by mouth 2 (two) times daily. 60 tablet 1   haloperidol (HALDOL) 5 MG tablet Take 3 tablets (15 mg total) by mouth at bedtime. 90 tablet 1   lithium carbonate (LITHOBID) 300 MG CR tablet Take 1 tablet (300 mg total) by mouth every morning AND 2 tablets (600 mg total) at bedtime. 90 tablet 1   QUEtiapine (SEROQUEL) 25 MG tablet Take 3 tablets (75 mg total) by mouth at bedtime. 90 tablet 1   No current facility-administered medications for this visit.    Medication Side Effects: Other:  Possible thinning hair. Some mild visual changes.  Allergies: No Known Allergies  Past Medical History:  Diagnosis Date   Allergic rhinitis    Anxiety    Depression    Schizophrenia (HCC)     Family History  Problem Relation Age of Onset   Hypertension Mother    Impulse control disorder Mother    CVA Father    Depression Father     Social History   Socioeconomic History   Marital status: Married    Spouse name: [name]   Number of children: 2   Years of education:  16   Highest education level: Bachelor's degree (e.g., BA, AB, BS)  Occupational History   Occupation: [employer]  Tobacco Use   Smoking status: Former Smoker    Packs/day: 1.00    Years: 1.00    Pack years: 1.00   Smokeless tobacco: Never Used  Building services engineer Use: Never used  Substance and Sexual Activity   Alcohol use: Yes    Alcohol/week: 1.0 standard drink    Types: 1 Glasses of wine per week    Comment: very little   Drug use: No   Sexual activity: Yes    Partners: Male  Other Topics Concern   Not on file  Social History Narrative   Patient lives with her husband here in [place], his name is [name].    Patient works as a Holiday representative at [employer].    Patient is recently from [place].       Hopeful to get back to exercising once the weather cools down.    She was participating in [employer], however the pandemic has made this difficult for her.    Social Determinants of Health   Financial Resource Strain: Low Risk    Difficulty of Paying Living Expenses: Not hard at all  Food Insecurity: Food Insecurity Present   Worried About Programme researcher, broadcasting/film/video in the Last Year: Sometimes true   Barista in the Last Year: Sometimes true  Transportation Needs: No Transportation Needs   Lack of Transportation (Medical): No   Lack of Transportation (Non-Medical): No  Physical Activity: Insufficiently Active   Days of Exercise per Week: 7 days   Minutes of Exercise per Session: 10 min  Stress: Stress Concern Present   Feeling of Stress : To some extent  Social Connections: Moderately Isolated   Frequency of Communication with Friends and Family: Once a week   Frequency of Social Gatherings with Friends and Family: Never   Attends Religious Services: More than 4 times per year   Active Member of [employer] or Organizations: No   Attends Banker Meetings: Never   Marital Status: Married  Catering manager Violence: Not At Risk   Fear  of Current or Ex-Partner: No   Emotionally Abused: No   Physically Abused: No   Sexually Abused: No    Past Medical History, Surgical history, Social history, and Family history were reviewed and updated as appropriate.   Please see review of systems for further details on the patient's review from today.   Objective:   Physical Exam:  There were no vitals taken for this visit.  Physical Exam Constitutional:      General: She is not in acute distress. Musculoskeletal:        General: No deformity.  Neurological:     Mental Status: She is alert and oriented to person, place, and time.     Coordination:  Coordination normal.  Psychiatric:        Attention and Perception: Attention and perception normal. She does not perceive auditory or visual hallucinations.        Mood and Affect: Mood is anxious. Mood is not depressed. Affect is not labile, blunt, angry or inappropriate.        Speech: Speech is rapid and pressured.        Behavior: Behavior normal.        Thought Content: Thought content is not delusional. Thought content does not include homicidal or suicidal ideation. Thought content does not include homicidal or suicidal plan.        Cognition and Memory: Cognition and memory normal.        Judgment: Judgment normal.     Comments: Insight intact Mild paranoia     Lab Review:     Component Value Date/Time   NA 135 04/01/2020 0348   NA 136 03/26/2020 1226   K 3.9 04/01/2020 0348   CL 101 04/01/2020 0348   CO2 27 04/01/2020 0348   GLUCOSE 105 (H) 04/01/2020 0348   BUN 7 04/01/2020 0348   BUN 10 03/26/2020 1226   CREATININE 0.65 04/01/2020 0348   CALCIUM 8.9 04/01/2020 0348   PROT 6.9 04/01/2020 0348   PROT 7.7 03/26/2020 1226   ALBUMIN 3.7 04/01/2020 0348   ALBUMIN 4.7 03/26/2020 1226   AST 34 04/01/2020 0348   ALT 34 04/01/2020 0348   ALKPHOS 65 04/01/2020 0348   BILITOT 0.5 04/01/2020 0348   BILITOT 0.3 03/26/2020 1226   GFRNONAA >60 04/01/2020 0348    GFRAA >60 04/01/2020 0348       Component Value Date/Time   WBC 10.7 (H) 04/01/2020 0348   RBC 4.01 04/01/2020 0348   HGB 12.5 04/01/2020 0348   HGB 13.5 03/26/2020 1226   HCT 37.4 04/01/2020 0348   HCT 40.6 03/26/2020 1226   PLT 331 04/01/2020 0348   PLT 399 03/26/2020 1226   MCV 93.3 04/01/2020 0348   MCV 91 03/26/2020 1226   MCH 31.2 04/01/2020 0348   MCHC 33.4 04/01/2020 0348   RDW 12.2 04/01/2020 0348   RDW 11.8 03/26/2020 1226   LYMPHSABS 0.8 03/28/2020 1646   MONOABS 0.8 03/28/2020 1646   EOSABS 0.0 03/28/2020 1646   BASOSABS 0.0 03/28/2020 1646    Lithium Lvl  Date Value Ref Range Status  04/18/2020 0.82 0.60 - 1.20 mmol/L Final    Comment:    Performed at [facility], [address] [place], [place] [address]     No results found for: PHENYTOIN, PHENOBARB, VALPROATE, CBMZ   .res Assessment: Plan:   Discussed obtaining another lithium level since last lithium level appears to have been drawn after 10 AM and patient reports that lab may have been drawn after she had her morning dose of lithium.  Also, patient is reporting some concerns about people from her past. Will continue all other medications as prescribed. Agree with plan to start therapy with Marliss Czar, PhD. Patient to follow-up with this provider in 4 weeks or sooner if clinically indicated. Patient advised to contact office with any questions, adverse effects, or acute worsening in signs and symptoms.  Sandeep was seen today for follow-up.  Diagnoses and all orders for this visit:  High risk medication use -     Lithium level  Acute psychosis (HCC) -     lithium carbonate (LITHOBID) 300 MG CR tablet; Take 1 tablet (300 mg total) by mouth  every morning AND 2 tablets (600 mg total) at bedtime. -     QUEtiapine (SEROQUEL) 25 MG tablet; Take 3 tablets (75 mg total) by mouth at bedtime.  Bipolar disorder, current episode mixed, mild (HCC) -     lithium carbonate (LITHOBID) 300 MG CR  tablet; Take 1 tablet (300 mg total) by mouth every morning AND 2 tablets (600 mg total) at bedtime. -     QUEtiapine (SEROQUEL) 25 MG tablet; Take 3 tablets (75 mg total) by mouth at bedtime.  Other orders -     benztropine (COGENTIN) 1 MG tablet; Take 1 tablet (1 mg total) by mouth 2 (two) times daily. -     haloperidol (HALDOL) 5 MG tablet; Take 3 tablets (15 mg total) by mouth at bedtime.     Please see After Visit Summary for patient specific instructions.  Future Appointments  Date Time Provider Department Center  05/28/2020 11:00 AM Robley Fries, PhD [facility] None    Orders Placed This Encounter  Procedures   Lithium level    -------------------------------

## 2020-05-26 ENCOUNTER — Ambulatory Visit: Payer: Medicare Other | Admitting: Psychiatry

## 2020-05-28 ENCOUNTER — Other Ambulatory Visit: Payer: Self-pay

## 2020-05-28 ENCOUNTER — Ambulatory Visit (INDEPENDENT_AMBULATORY_CARE_PROVIDER_SITE_OTHER): Payer: Medicare Other | Admitting: Psychiatry

## 2020-05-28 DIAGNOSIS — F3161 Bipolar disorder, current episode mixed, mild: Secondary | ICD-10-CM
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
## 2020-06-03 ENCOUNTER — Ambulatory Visit (INDEPENDENT_AMBULATORY_CARE_PROVIDER_SITE_OTHER): Payer: Medicare Other | Admitting: Psychiatry

## 2020-06-03 ENCOUNTER — Encounter: Payer: Self-pay | Admitting: Psychiatry

## 2020-06-03 ENCOUNTER — Other Ambulatory Visit: Payer: Self-pay

## 2020-06-03 DIAGNOSIS — F312 Bipolar disorder, current episode manic severe with psychotic features: Secondary | ICD-10-CM | POA: Diagnosis not present

## 2020-06-03 NOTE — Progress Notes (Signed)
Tanya Simpson [id number] 06-26-1961 59 y.o.  Subjective:   Patient ID:  Tanya Simpson is a 59 y.o. (DOB 1961/05/29) female.  Chief Complaint:  Chief Complaint  Patient presents with  . Medication Problem    HPI Tanya Simpson presents to the office today for follow-up of mood disturbance and h/o paranoia. She reports scheduling earlier apt due to possible side effects. She is concerned about the number of pills she is taking. She is concerned about hair loss and blurry vision. She reports that her legs are numb when she has too much salt. Reports that she had Lithium level drawn yesterday. She reports that her energy level is low when she first wakes up and then improves. She reports some difficulty falling asleep. She reports adequate sleep once she is able to fall asleep. Estimates sleeping 8-10 hours a night. She describes mood as "good." She reports that her appetite has been good. She reports that she has been concentrating on her mental health. She reports that her motivation is adequate. She indicates some mild paranoia that is consistent with baseline. Denies SI.   Leaving Tuesday for trip to [place] for 4 days.    AIMS     Office Visit from 03/25/2020 in [facility] Office Visit from 01/17/2020 in [facility] Office Visit from 11/09/2019 in [facility] Office Visit from 04/18/2019 in [facility]  AIMS Total Score 0 1 2 0    AUDIT     Admission (Discharged) from 04/01/2020 in [facility] INPATIENT BEHAVIORAL MEDICINE  Alcohol Use Disorder Identification Test Final Score (AUDIT) 0    GAD-7     Office Visit from 04/20/2018 in [facility] Office Visit from 03/28/2018 in [facility] Office Visit from 03/14/2018 in [facility] Office Visit from 02/28/2018 in [facility] Office Visit from 10/11/2017 in [facility]  Total GAD-7 Score 3 3 1  3 3     PHQ2-9     Office Visit from 03/26/2020 in [facility] Office Visit from 05/30/2019 in [facility] Office Visit from 12/19/2018 in [facility] Office Visit from 04/20/2018 in [facility] Office Visit from 03/28/2018 in [facility]  PHQ-2 Total Score 1 0 0 1 0  PHQ-9 Total Score -- -- -- 3 2       Review of Systems:  Review of Systems  Constitutional:       Hair loss  HENT: Positive for drooling.   Musculoskeletal: Negative for gait problem.  Neurological: Positive for tremors.  Psychiatric/Behavioral:       Please refer to HPI    Medications: I have reviewed the patient's current medications.  Current Outpatient Medications  Medication Sig Dispense Refill  . benztropine (COGENTIN) 1 MG tablet Take 1 tablet (1 mg total) by mouth 2 (two) times daily. 60 tablet 1  . haloperidol (HALDOL) 5 MG tablet Take 3 tablets (15 mg total) by mouth at bedtime. 90 tablet 1  . lithium carbonate (LITHOBID) 300 MG CR tablet Take 1 tablet (300 mg total) by mouth every morning AND 2 tablets (600 mg total) at bedtime. 90 tablet 1  . loratadine (CLARITIN) 10 MG tablet Take 10 mg by mouth daily.    . QUEtiapine (SEROQUEL) 25 MG tablet Take 3 tablets (75 mg total) by mouth at bedtime. 90 tablet 1   No  current facility-administered medications for this visit.    Medication Side Effects: Other: blurry vision, mouth watering, hair loss  Allergies: No Known Allergies  Past Medical History:  Diagnosis Date  . Allergic rhinitis   . Anxiety   . Depression   . Schizophrenia (HCC)     Family History  Problem Relation Age of Onset  . Hypertension Mother   . Impulse control disorder Mother   . CVA Father   . Depression Father     Social History   Socioeconomic History  . Marital status: Married    Spouse name: [name]  . Number of children: 2  . Years of education: 16  . Highest  education level: Bachelor's degree (e.g., BA, AB, BS)  Occupational History  . Occupation: [employer]  Tobacco Use  . Smoking status: Former Smoker    Packs/day: 1.00    Years: 1.00    Pack years: 1.00  . Smokeless tobacco: Never Used  Vaping Use  . Vaping Use: Never used  Substance and Sexual Activity  . Alcohol use: Yes    Alcohol/week: 1.0 standard drink    Types: 1 Glasses of wine per week    Comment: very little  . Drug use: No  . Sexual activity: Yes    Partners: Male  Other Topics Concern  . Not on file  Social History Narrative   Patient lives with her husband here in [place], his name is [name].    Patient works as a Holiday representative at [employer].    Patient is recently from [place].       Hopeful to get back to exercising once the weather cools down.    She was participating in [employer], however the pandemic has made this difficult for her.    Social Determinants of Health   Financial Resource Strain:   . Difficulty of Paying Living Expenses: Not on file  Food Insecurity:   . Worried About Programme researcher, broadcasting/film/video in the Last Year: Not on file  . Ran Out of Food in the Last Year: Not on file  Transportation Needs:   . Lack of Transportation (Medical): Not on file  . Lack of Transportation (Non-Medical): Not on file  Physical Activity:   . Days of Exercise per Week: Not on file  . Minutes of Exercise per Session: Not on file  Stress:   . Feeling of Stress : Not on file  Social Connections:   . Frequency of Communication with Friends and Family: Not on file  . Frequency of Social Gatherings with Friends and Family: Not on file  . Attends Religious Services: Not on file  . Active Member of Clubs or Organizations: Not on file  . Attends Banker Meetings: Not on file  . Marital Status: Not on file  Intimate Partner Violence:   . Fear of Current or Ex-Partner: Not on file  . Emotionally Abused: Not on file  . Physically Abused: Not on file  .  Sexually Abused: Not on file    Past Medical History, Surgical history, Social history, and Family history were reviewed and updated as appropriate.   Please see review of systems for further details on the patient's review from today.   Objective:   Physical Exam:  There were no vitals taken for this visit.  Physical Exam Constitutional:      General: She is not in acute distress. Musculoskeletal:        General: No deformity.  Neurological:  Mental Status: She is alert and oriented to person, place, and time.     Coordination: Coordination normal.  Psychiatric:        Attention and Perception: Attention and perception normal. She does not perceive auditory or visual hallucinations.        Mood and Affect: Mood is anxious. Mood is not depressed. Affect is not labile, blunt, angry or inappropriate.        Speech: Speech is rapid and pressured.        Behavior: Behavior normal.        Thought Content: Thought content is paranoid. Thought content is not delusional. Thought content does not include homicidal or suicidal ideation. Thought content does not include homicidal or suicidal plan.        Cognition and Memory: Cognition and memory normal.        Judgment: Judgment normal.     Comments: Insight intact     Lab Review:     Component Value Date/Time   NA 135 04/01/2020 0348   NA 136 03/26/2020 1226   K 3.9 04/01/2020 0348   CL 101 04/01/2020 0348   CO2 27 04/01/2020 0348   GLUCOSE 105 (H) 04/01/2020 0348   BUN 7 04/01/2020 0348   BUN 10 03/26/2020 1226   CREATININE 0.65 04/01/2020 0348   CALCIUM 8.9 04/01/2020 0348   PROT 6.9 04/01/2020 0348   PROT 7.7 03/26/2020 1226   ALBUMIN 3.7 04/01/2020 0348   ALBUMIN 4.7 03/26/2020 1226   AST 34 04/01/2020 0348   ALT 34 04/01/2020 0348   ALKPHOS 65 04/01/2020 0348   BILITOT 0.5 04/01/2020 0348   BILITOT 0.3 03/26/2020 1226   GFRNONAA >60 04/01/2020 0348   GFRAA >60 04/01/2020 0348       Component Value Date/Time    WBC 10.7 (H) 04/01/2020 0348   RBC 4.01 04/01/2020 0348   HGB 12.5 04/01/2020 0348   HGB 13.5 03/26/2020 1226   HCT 37.4 04/01/2020 0348   HCT 40.6 03/26/2020 1226   PLT 331 04/01/2020 0348   PLT 399 03/26/2020 1226   MCV 93.3 04/01/2020 0348   MCV 91 03/26/2020 1226   MCH 31.2 04/01/2020 0348   MCHC 33.4 04/01/2020 0348   RDW 12.2 04/01/2020 0348   RDW 11.8 03/26/2020 1226   LYMPHSABS 0.8 03/28/2020 1646   MONOABS 0.8 03/28/2020 1646   EOSABS 0.0 03/28/2020 1646   BASOSABS 0.0 03/28/2020 1646    Lithium Lvl  Date Value Ref Range Status  04/18/2020 0.82 0.60 - 1.20 mmol/L Final    Comment:    Performed at [facility], [address] [place], [place] [address]     No results found for: PHENYTOIN, PHENOBARB, VALPROATE, CBMZ   .res Assessment: Plan:   Discussed awaiting results of Lithium level prior to adjusting treatment plan since pt had Lithium level drawn yesterday. Will call pt with results of Lithium level. Also encouraged pt to avoid making significant changes to treatment plans prior to traveling out of state.   Pt reports that she plans to reduce Lithium to 300 mg po BID.  Pt to f/u on 06/20/20 or sooner if clinically indicated.  Patient advised to contact office with any questions, adverse effects, or acute worsening in signs and symptoms.  Tanya Simpson was seen today for medication problem.  Diagnoses and all orders for this visit:  Bipolar affective disorder, current episode manic with psychotic symptoms (HCC)     Please see After Visit Summary for patient specific  instructions.  Future Appointments  Date Time Provider Department Center  06/20/2020 10:30 AM Corie Chiquito, PMHNP [facility] None    No orders of the defined types were placed in this encounter.   -------------------------------

## 2020-06-05 ENCOUNTER — Telehealth: Payer: Self-pay | Admitting: Psychiatry

## 2020-06-05 DIAGNOSIS — F23 Brief psychotic disorder: Secondary | ICD-10-CM

## 2020-06-05 DIAGNOSIS — F3161 Bipolar disorder, current episode mixed, mild: Secondary | ICD-10-CM

## 2020-06-05 MED ORDER — LITHIUM CARBONATE ER 300 MG PO TBCR: EXTENDED_RELEASE_TABLET | ORAL | 1 refills | Status: DC

## 2020-06-05 MED ORDER — HALOPERIDOL 5 MG PO TABS: 20.0000 mg | ORAL_TABLET | Freq: Every day | ORAL | 1 refills | Status: DC

## 2020-06-05 NOTE — Telephone Encounter (Signed)
Called pt back and discussed that her lithium level was 1.1. She has been taking Lithium 300 mg BID since 06/02/20. She reports that it took her awhile to fall asleep last night.     Plan: Increase Haldol to 20 mg at bedtime.  Decrease Lithium to 300 mg BID.

## 2020-06-05 NOTE — Telephone Encounter (Signed)
She would like a call regarding her meds.  Going to [place] next week. Needs to get clarity.  Call at [phone] landline [phone] cell

## 2020-06-05 NOTE — Telephone Encounter (Signed)
Will do!

## 2020-06-05 NOTE — Telephone Encounter (Signed)
Pt called back asking Shanda Bumps to return call @ [phone] or [phone]. Need to take about meds and going out of town. Shanda Bumps was to talk with Dr. Jennelle Human and return her call.

## 2020-06-05 NOTE — Addendum Note (Signed)
Addended by: Derenda Mis on: 06/05/2020 04:53 PM   Modules accepted: Orders

## 2020-06-20 ENCOUNTER — Ambulatory Visit (INDEPENDENT_AMBULATORY_CARE_PROVIDER_SITE_OTHER): Payer: Medicare Other | Admitting: Psychiatry

## 2020-06-20 ENCOUNTER — Other Ambulatory Visit: Payer: Self-pay

## 2020-06-20 ENCOUNTER — Encounter: Payer: Self-pay | Admitting: Psychiatry

## 2020-06-20 DIAGNOSIS — F3161 Bipolar disorder, current episode mixed, mild: Secondary | ICD-10-CM | POA: Diagnosis not present

## 2020-06-20 DIAGNOSIS — F23 Brief psychotic disorder: Secondary | ICD-10-CM

## 2020-06-20 MED ORDER — LITHIUM CARBONATE ER 300 MG PO TBCR: 300.0000 mg | EXTENDED_RELEASE_TABLET | Freq: Every morning | ORAL | 1 refills | Status: DC

## 2020-06-20 MED ORDER — HALOPERIDOL 20 MG PO TABS: 20.0000 mg | ORAL_TABLET | Freq: Every day | ORAL | 1 refills | Status: DC

## 2020-06-20 NOTE — Progress Notes (Signed)
Neville Pauls [id number] 1960-10-24 59 y.o.  Subjective:   Patient ID:  Tanya Simpson is a 59 y.o. (DOB [birth year]) female.  Chief Complaint:  Chief Complaint  Patient presents with  . Medication Problem    HPI Tanya Simpson presents to the office today for follow-up of medication side effects and mild depression.  Patient reports that she notices an overall decrease in side effects with decrease in lithium dose, however she continues to notice some side effects to include some slight hair loss, difficulty getting moving in the morning, and occasional leg cramps.  She also reports poor vision and reports that it has improved slightly with lower dose of lithium.  She reports that she is sleeping better with increase in Haldol and is now sleeping about 9 hours a night.  She reports that she was "a little depressed" after some interactions with a family member during a recent trip to the floor and that otherwise mood has been okay.  She reports that she sometimes will "still feel like people from the past or watching."  She reports that her energy and motivation are okay once she gets up and gets going in the morning.  She reports that she would like to make friends.  Recently was able to get books from [employer].  Feels as if her concentration is okay.  She reports that her appetite varies.  She reports that she quit weight watchers because she felt as if the leader was laughing at her.  Denies SI.   AIMS     Office Visit from 03/25/2020 in [facility] Office Visit from 01/17/2020 in [facility] Office Visit from 11/09/2019 in [facility] Office Visit from 04/18/2019 in [facility]  AIMS Total Score 0 1 2 0    GAD-7     Office Visit from 04/20/2018 in [facility] Office Visit from 03/28/2018 in [facility] Office Visit from 03/14/2018 in [facility] Office Visit from  02/28/2018 in [facility] Office Visit from 10/11/2017 in [facility]  Total GAD-7 Score 3 3 1 3 3     PHQ2-9     Office Visit from 03/26/2020 in [facility] Office Visit from 05/30/2019 in [facility] Office Visit from 12/19/2018 in [facility] Office Visit from 04/20/2018 in [facility] Office Visit from 03/28/2018 in [facility]  PHQ-2 Total Score 1 0 0 1 0  PHQ-9 Total Score -- -- -- 3 2       Review of Systems:  Review of Systems  HENT: Positive for congestion and sneezing.   Eyes: Positive for visual disturbance.  Musculoskeletal: Negative for gait problem.       Occasional leg cramps  Psychiatric/Behavioral:       Please refer to HPI    Medications: I have reviewed the patient's current medications.  Current Outpatient Medications  Medication Sig Dispense Refill  . benztropine (COGENTIN) 1 MG tablet Take 1 tablet (1 mg total) by mouth 2 (two) times daily. 60 tablet 1  . haloperidol (HALDOL) 20 MG tablet Take 1 tablet (20 mg total) by mouth at bedtime. 30 tablet 1  . lithium carbonate (LITHOBID) 300 MG CR tablet Take 1 tablet (300 mg total) by mouth every morning. 90 tablet 1  . loratadine (CLARITIN) 10 MG tablet Take 10 mg by mouth  daily.    . QUEtiapine (SEROQUEL) 25 MG tablet Take 3 tablets (75 mg total) by mouth at bedtime. 90 tablet 1   No current facility-administered medications for this visit.    Medication Side Effects: Other: Blurred vision, leg cramps, hair loss  Allergies: No Known Allergies  Past Medical History:  Diagnosis Date  . Allergic rhinitis   . Anxiety   . Depression   . Schizophrenia (HCC)     Family History  Problem Relation Age of Onset  . Hypertension Mother   . Impulse control disorder Mother   . CVA Father   . Depression Father     Social History   Socioeconomic History  . Marital  status: Married    Spouse name: [name]  . Number of children: 2  . Years of education: 16  . Highest education level: Bachelor's degree (e.g., BA, AB, BS)  Occupational History  . Occupation: [employer]  Tobacco Use  . Smoking status: Former Smoker    Packs/day: 1.00    Years: 1.00    Pack years: 1.00  . Smokeless tobacco: Never Used  Vaping Use  . Vaping Use: Never used  Substance and Sexual Activity  . Alcohol use: Yes    Alcohol/week: 1.0 standard drink    Types: 1 Glasses of wine per week    Comment: very little  . Drug use: No  . Sexual activity: Yes    Partners: Male  Other Topics Concern  . Not on file  Social History Narrative   Patient lives with her husband here in [place], his name is [name].    Patient works as a Holiday representative at [employer].    Patient is recently from [place].       Hopeful to get back to exercising once the weather cools down.    She was participating in [employer], however the pandemic has made this difficult for her.    Social Determinants of Health   Financial Resource Strain:   . Difficulty of Paying Living Expenses: Not on file  Food Insecurity:   . Worried About Programme researcher, broadcasting/film/video in the Last Year: Not on file  . Ran Out of Food in the Last Year: Not on file  Transportation Needs:   . Lack of Transportation (Medical): Not on file  . Lack of Transportation (Non-Medical): Not on file  Physical Activity:   . Days of Exercise per Week: Not on file  . Minutes of Exercise per Session: Not on file  Stress:   . Feeling of Stress : Not on file  Social Connections:   . Frequency of Communication with Friends and Family: Not on file  . Frequency of Social Gatherings with Friends and Family: Not on file  . Attends Religious Services: Not on file  . Active Member of Clubs or Organizations: Not on file  . Attends Banker Meetings: Not on file  . Marital Status: Not on file  Intimate Partner Violence:   . Fear of Current or  Ex-Partner: Not on file  . Emotionally Abused: Not on file  . Physically Abused: Not on file  . Sexually Abused: Not on file    Past Medical History, Surgical history, Social history, and Family history were reviewed and updated as appropriate.   Please see review of systems for further details on the patient's review from today.   Objective:   Physical Exam:  There were no vitals taken for this visit.  Physical Exam Constitutional:  General: She is not in acute distress. Musculoskeletal:        General: No deformity.  Neurological:     Mental Status: She is alert and oriented to person, place, and time.     Coordination: Coordination normal.  Psychiatric:        Attention and Perception: Attention and perception normal. She does not perceive auditory or visual hallucinations.        Mood and Affect: Mood normal. Mood is not anxious or depressed. Affect is not labile, blunt, angry or inappropriate.        Speech: Speech is rapid and pressured.        Behavior: Behavior normal. Behavior is cooperative.        Thought Content: Thought content is paranoid. Thought content is not delusional. Thought content does not include homicidal or suicidal ideation. Thought content does not include homicidal or suicidal plan.        Cognition and Memory: Cognition and memory normal.        Judgment: Judgment normal.     Lab Review:     Component Value Date/Time   NA 135 04/01/2020 0348   NA 136 03/26/2020 1226   K 3.9 04/01/2020 0348   CL 101 04/01/2020 0348   CO2 27 04/01/2020 0348   GLUCOSE 105 (H) 04/01/2020 0348   BUN 7 04/01/2020 0348   BUN 10 03/26/2020 1226   CREATININE 0.65 04/01/2020 0348   CALCIUM 8.9 04/01/2020 0348   PROT 6.9 04/01/2020 0348   PROT 7.7 03/26/2020 1226   ALBUMIN 3.7 04/01/2020 0348   ALBUMIN 4.7 03/26/2020 1226   AST 34 04/01/2020 0348   ALT 34 04/01/2020 0348   ALKPHOS 65 04/01/2020 0348   BILITOT 0.5 04/01/2020 0348   BILITOT 0.3 03/26/2020  1226   GFRNONAA >60 04/01/2020 0348   GFRAA >60 04/01/2020 0348       Component Value Date/Time   WBC 10.7 (H) 04/01/2020 0348   RBC 4.01 04/01/2020 0348   HGB 12.5 04/01/2020 0348   HGB 13.5 03/26/2020 1226   HCT 37.4 04/01/2020 0348   HCT 40.6 03/26/2020 1226   PLT 331 04/01/2020 0348   PLT 399 03/26/2020 1226   MCV 93.3 04/01/2020 0348   MCV 91 03/26/2020 1226   MCH 31.2 04/01/2020 0348   MCHC 33.4 04/01/2020 0348   RDW 12.2 04/01/2020 0348   RDW 11.8 03/26/2020 1226   LYMPHSABS 0.8 03/28/2020 1646   MONOABS 0.8 03/28/2020 1646   EOSABS 0.0 03/28/2020 1646   BASOSABS 0.0 03/28/2020 1646    Lithium Lvl  Date Value Ref Range Status  04/18/2020 0.82 0.60 - 1.20 mmol/L Final    Comment:    Performed at [facility], [address] [place], [place] [address]     No results found for: PHENYTOIN, PHENOBARB, VALPROATE, CBMZ   .res Assessment: Plan:   Will decrease lithium to 300 mg daily due to continued side effects.  Discussed continuing lithium 300 mg in the morning and discontinuing dose at bedtime since patient reports that she typically notices side effects more bedtime when taken in combination with her other bedtime medications.  Discussed that she may need to start another medication in place of lithium since she has not been tolerating lithium. Will continue Haldol 20 mg at bedtime since patient reports that this has been helpful and seems to be well-tolerated. Patient to follow-up with this provider.  Weeks or sooner if clinically indicated. Patient advised to contact office  with any questions, adverse effects, or acute worsening in signs and symptoms.  Tanya Simpson was seen today for medication problem.  Diagnoses and all orders for this visit:  Acute psychosis (HCC) -     lithium carbonate (LITHOBID) 300 MG CR tablet; Take 1 tablet (300 mg total) by mouth every morning. -     haloperidol (HALDOL) 20 MG tablet; Take 1 tablet (20 mg total) by mouth at  bedtime.  Bipolar disorder, current episode mixed, mild (HCC) -     lithium carbonate (LITHOBID) 300 MG CR tablet; Take 1 tablet (300 mg total) by mouth every morning.     Please see After Visit Summary for patient specific instructions.  Future Appointments  Date Time Provider Department Center  07/15/2020 11:00 AM Corie Chiquitoarter, Selenia Mihok, PMHNP [facility]-CP None    No orders of the defined types were placed in this encounter.   -------------------------------

## 2020-07-04 ENCOUNTER — Encounter: Payer: Self-pay | Admitting: Student in an Organized Health Care Education/Training Program

## 2020-07-04 ENCOUNTER — Ambulatory Visit (INDEPENDENT_AMBULATORY_CARE_PROVIDER_SITE_OTHER): Payer: Medicare Other | Admitting: Student in an Organized Health Care Education/Training Program

## 2020-07-04 ENCOUNTER — Other Ambulatory Visit: Payer: Self-pay

## 2020-07-04 VITALS — BP 122/80 | HR 73

## 2020-07-04 DIAGNOSIS — Z1231 Encounter for screening mammogram for malignant neoplasm of breast: Secondary | ICD-10-CM

## 2020-07-04 DIAGNOSIS — Z789 Other specified health status: Secondary | ICD-10-CM

## 2020-07-04 DIAGNOSIS — N644 Mastodynia: Secondary | ICD-10-CM | POA: Diagnosis not present

## 2020-07-04 MED ORDER — PRENATAL VITAMINS 28-0.8 MG PO TABS: 1.0000 | ORAL_TABLET | Freq: Every day | ORAL | 1 refills | Status: AC

## 2020-07-04 NOTE — Progress Notes (Signed)
   SUBJECTIVE:   CHIEF COMPLAINT / HPI: med side effects  Requests daily vitamin suggestions.  Wants my recommendation on a Dietary Supplement- chromium main ingredient. Concerned that her hair is thinning  bilateral breast tenderness for approximately a week, no period in 2 years. Not sexually active. No rash, skin changes, nipple discharge, nodules.  OBJECTIVE:  BP 122/80   Pulse 73   SpO2 99%   General: NAD, pleasant, able to participate in exam Cardiac: RRR, normal heart sounds, no murmurs. 2+ radial and PT pulses bilaterally Respiratory: CTAB, normal effort, No wheezes, rales or rhonchi Breast: negative for masses, expressed discharge. Normal breast tissue bilaterally. Extremities: no edema or cyanosis. WWP. Skin: warm and dry, no rashes noted Neuro: alert and oriented x4, no focal deficits Psych: Normal affect and mood  ASSESSMENT/PLAN:   Takes daily multivitamins Recommend prenatal vitamin for benefits to hair/nails. Checked for drug interactions of the dietary supplement patient brought with her other medications and there is no known interactions. We discussed cost/benefit of the supplement and she decided not to take the supplement. We discussed diet and exercise suggestions instead.    Breast tenderness No abnormalities and no tenderness on exam Recommended ibuprofen and applying heat. Return if worsening or not improving in a week or so. Recommend get regular screening mammogram. Order placed     Leeroy Bock, DO [facility]

## 2020-07-04 NOTE — Patient Instructions (Signed)
It was a pleasure to see you today!  To summarize our discussion for this visit:  You had a normal breast exam. I would recommend you get a screening mammogram in a couple weeks. You can use ibuprofen and warm compresses if still having tenderness for a few days. Weight loss can also help possibly.  I have sent in a prescription for a prenatal vitamin which you can take daily which could benefit your hair and nail growth as well.    Some additional health maintenance measures we should update are: Health Maintenance Due  Topic Date Due  . COVID-19 Vaccine (1) Never done  . TETANUS/TDAP  Never done  . PAP SMEAR-Modifier  Never done  . MAMMOGRAM  Never done  . COLONOSCOPY  Never done  . INFLUENZA VACCINE  05/04/2020  .   Call the clinic at [phone] if your symptoms worsen or you have any concerns.   Thank you for allowing me to take part in your care,  Dr. Jamelle Rushing

## 2020-07-07 DIAGNOSIS — N644 Mastodynia: Secondary | ICD-10-CM | POA: Insufficient documentation

## 2020-07-07 DIAGNOSIS — Z789 Other specified health status: Secondary | ICD-10-CM | POA: Insufficient documentation

## 2020-07-07 NOTE — Assessment & Plan Note (Addendum)
No abnormalities and no tenderness on exam Recommended ibuprofen and applying heat. Return if worsening or not improving in a week or so. Recommend get regular screening mammogram. Order placed

## 2020-07-07 NOTE — Assessment & Plan Note (Signed)
Recommend prenatal vitamin for benefits to hair/nails. Checked for drug interactions of the dietary supplement patient brought with her other medications and there is no known interactions. We discussed cost/benefit of the supplement and she decided not to take the supplement. We discussed diet and exercise suggestions instead.

## 2020-07-15 ENCOUNTER — Encounter: Payer: Self-pay | Admitting: Psychiatry

## 2020-07-15 ENCOUNTER — Other Ambulatory Visit: Payer: Self-pay

## 2020-07-15 ENCOUNTER — Ambulatory Visit (INDEPENDENT_AMBULATORY_CARE_PROVIDER_SITE_OTHER): Payer: Medicare Other | Admitting: Psychiatry

## 2020-07-15 DIAGNOSIS — F312 Bipolar disorder, current episode manic severe with psychotic features: Secondary | ICD-10-CM | POA: Diagnosis not present

## 2020-07-15 MED ORDER — HALOPERIDOL 20 MG PO TABS: 20.0000 mg | ORAL_TABLET | Freq: Every day | ORAL | 1 refills | Status: DC

## 2020-07-15 MED ORDER — QUETIAPINE FUMARATE 25 MG PO TABS: 75.0000 mg | ORAL_TABLET | Freq: Every day | ORAL | 1 refills | Status: DC

## 2020-07-15 MED ORDER — LITHIUM CARBONATE ER 300 MG PO TBCR: 300.0000 mg | EXTENDED_RELEASE_TABLET | Freq: Every morning | ORAL | 1 refills | Status: DC

## 2020-07-15 MED ORDER — BENZTROPINE MESYLATE 1 MG PO TABS: 1.0000 mg | ORAL_TABLET | Freq: Two times a day (BID) | ORAL | 1 refills | Status: DC

## 2020-07-15 NOTE — Progress Notes (Signed)
Tanya Simpson [id number] 04-Mar-1961 59 y.o.  Subjective:   Patient ID:  Tanya Simpson is a 59 y.o. (DOB [birth year]) female.  Chief Complaint:  Chief Complaint  Patient presents with  . Follow-up    Mood disturbance, anxiety    HPI Tanya Simpson presents to the office today for follow-up of mood disturbance and anxiety. She reports that she has been feeling tired upon awakening and does not feel alert until about 11 am. She reports that there has been some tension between she and her husband. She reports feeling that her husband is spying on her.   She reports that her mood has been ok. Denies persistent depressed mood. She reports that she is somewhat anxious about finding a job. She reports that may feel more anxious on lower dose of Lithium. She reports that her energy has been better with lower dose of Lithium. Sleeping well. Appetite has been good. She reports she has had difficulty with concentration and has not been able to complete a book she was trying to read. She reports that she has been spending "a little too much." Has been going to church functions and socializing some. Denies SI.   She will travel to [place] to visit her son over Halloween.     AIMS     Office Visit from 03/25/2020 in [facility] Office Visit from 01/17/2020 in [facility] Office Visit from 11/09/2019 in [facility] Office Visit from 04/18/2019 in [facility]  AIMS Total Score 0 1 2 0    GAD-7     Office Visit from 04/20/2018 in [facility] Office Visit from 03/28/2018 in [facility] Office Visit from 03/14/2018 in [facility] Office Visit from 02/28/2018 in [facility] Office Visit from 10/11/2017 in [facility]  Total GAD-7 Score 3 3 1 3 3     PHQ2-9     Office Visit from 07/04/2020 in [facility] Office Visit from  03/26/2020 in [facility] Office Visit from 05/30/2019 in [facility] Office Visit from 12/19/2018 in [facility] Office Visit from 04/20/2018 in [facility]  PHQ-2 Total Score 1 1 0 0 1  PHQ-9 Total Score 5 -- -- -- 3       Review of Systems:  Review of Systems  Musculoskeletal: Negative for gait problem.  Neurological: Negative for tremors.  Psychiatric/Behavioral:       Please refer to HPI    Medications: I have reviewed the patient's current medications.  Current Outpatient Medications  Medication Sig Dispense Refill  . benztropine (COGENTIN) 1 MG tablet Take 1 tablet (1 mg total) by mouth 2 (two) times daily. 60 tablet 1  . haloperidol (HALDOL) 20 MG tablet Take 1 tablet (20 mg total) by mouth at bedtime. 30 tablet 1  . lithium carbonate (LITHOBID) 300 MG CR tablet Take 1 tablet (300 mg total) by mouth every morning. 30 tablet 1  . loratadine (CLARITIN) 10 MG tablet Take 10 mg by mouth daily as needed.     . Prenatal Vit-Fe Fumarate-FA (PRENATAL VITAMINS) 28-0.8 MG TABS Take 1 tablet by mouth daily. 90 tablet 1  . QUEtiapine (SEROQUEL) 25 MG tablet Take 3 tablets (75 mg total) by mouth at bedtime. 90 tablet 1   No current facility-administered medications for this visit.    Medication Side Effects: Other:  Fatigue upon awakening  Allergies: No Known Allergies  Past Medical History:  Diagnosis Date  . Allergic rhinitis   . Anxiety   . Depression   . Schizophrenia (HCC)     Family History  Problem Relation Age of Onset  . Hypertension Mother   . Impulse control disorder Mother   . CVA Father   . Depression Father     Social History   Socioeconomic History  . Marital status: Married    Spouse name: Tanya Simpson  . Number of children: 2  . Years of education: 16  . Highest education level: Bachelor's degree (e.g., BA, AB, BS)  Occupational History  . Occupation: [employer]  Tobacco  Use  . Smoking status: Former Smoker    Packs/day: 1.00    Years: 1.00    Pack years: 1.00  . Smokeless tobacco: Never Used  Vaping Use  . Vaping Use: Never used  Substance and Sexual Activity  . Alcohol use: Yes    Alcohol/week: 1.0 standard drink    Types: 1 Glasses of wine per week    Comment: very little  . Drug use: No  . Sexual activity: Yes    Partners: Male  Other Topics Concern  . Not on file  Social History Narrative   Patient lives with her husband here in [place], his name is Tanya Simpson.    Patient works as a Holiday representative at [employer].    Patient is recently from [place].       Hopeful to get back to exercising once the weather cools down.    She was participating in [employer], however the pandemic has made this difficult for her.    Social Determinants of Health   Financial Resource Strain:   . Difficulty of Paying Living Expenses: Not on file  Food Insecurity:   . Worried About Programme researcher, broadcasting/film/video in the Last Year: Not on file  . Ran Out of Food in the Last Year: Not on file  Transportation Needs:   . Lack of Transportation (Medical): Not on file  . Lack of Transportation (Non-Medical): Not on file  Physical Activity:   . Days of Exercise per Week: Not on file  . Minutes of Exercise per Session: Not on file  Stress:   . Feeling of Stress : Not on file  Social Connections:   . Frequency of Communication with Friends and Family: Not on file  . Frequency of Social Gatherings with Friends and Family: Not on file  . Attends Religious Services: Not on file  . Active Member of Clubs or Organizations: Not on file  . Attends Banker Meetings: Not on file  . Marital Status: Not on file  Intimate Partner Violence:   . Fear of Current or Ex-Partner: Not on file  . Emotionally Abused: Not on file  . Physically Abused: Not on file  . Sexually Abused: Not on file    Past Medical History, Surgical history, Social history, and Family history were  reviewed and updated as appropriate.   Please see review of systems for further details on the patient's review from today.   Objective:   Physical Exam:  There were no vitals taken for this visit.  Physical Exam Constitutional:      General: She is not in acute distress. Musculoskeletal:        General: No deformity.  Neurological:     Mental Status: She is alert and oriented to person, place, and time.  Coordination: Coordination normal.  Psychiatric:        Attention and Perception: Attention and perception normal. She does not perceive auditory or visual hallucinations.        Mood and Affect: Mood is anxious. Mood is not depressed. Affect is labile. Affect is not blunt, angry or inappropriate.        Speech: Speech is rapid and pressured.        Behavior: Behavior is hyperactive. Behavior is cooperative.        Thought Content: Thought content is paranoid. Thought content is not delusional. Thought content does not include homicidal or suicidal ideation. Thought content does not include homicidal or suicidal plan.        Cognition and Memory: Cognition and memory normal.        Judgment: Judgment normal.     Comments: Insight fair     Lab Review:     Component Value Date/Time   NA 135 04/01/2020 0348   NA 136 03/26/2020 1226   K 3.9 04/01/2020 0348   CL 101 04/01/2020 0348   CO2 27 04/01/2020 0348   GLUCOSE 105 (H) 04/01/2020 0348   BUN 7 04/01/2020 0348   BUN 10 03/26/2020 1226   CREATININE 0.65 04/01/2020 0348   CALCIUM 8.9 04/01/2020 0348   PROT 6.9 04/01/2020 0348   PROT 7.7 03/26/2020 1226   ALBUMIN 3.7 04/01/2020 0348   ALBUMIN 4.7 03/26/2020 1226   AST 34 04/01/2020 0348   ALT 34 04/01/2020 0348   ALKPHOS 65 04/01/2020 0348   BILITOT 0.5 04/01/2020 0348   BILITOT 0.3 03/26/2020 1226   GFRNONAA >60 04/01/2020 0348   GFRAA >60 04/01/2020 0348       Component Value Date/Time   WBC 10.7 (H) 04/01/2020 0348   RBC 4.01 04/01/2020 0348   HGB 12.5  04/01/2020 0348   HGB 13.5 03/26/2020 1226   HCT 37.4 04/01/2020 0348   HCT 40.6 03/26/2020 1226   PLT 331 04/01/2020 0348   PLT 399 03/26/2020 1226   MCV 93.3 04/01/2020 0348   MCV 91 03/26/2020 1226   MCH 31.2 04/01/2020 0348   MCHC 33.4 04/01/2020 0348   RDW 12.2 04/01/2020 0348   RDW 11.8 03/26/2020 1226   LYMPHSABS 0.8 03/28/2020 1646   MONOABS 0.8 03/28/2020 1646   EOSABS 0.0 03/28/2020 1646   BASOSABS 0.0 03/28/2020 1646    Lithium Lvl  Date Value Ref Range Status  04/18/2020 0.82 0.60 - 1.20 mmol/L Final    Comment:    Performed at [facility], [address] [place], [place] [address]     No results found for: PHENYTOIN, PHENOBARB, VALPROATE, CBMZ   .res Assessment: Plan:   Discussed considering changing lithium to another mood stabilizer since patient has reported tolerability issues with lithium.  Patient reports that she would prefer not to make any significant medication changes at this time since she will be traveling out of state in a couple of weeks, and would prefer to consider changing mood stabilizer after her travels. Will continue Haldol 20 mg at bedtime. Continue lithium 300 mg in the morning for mood stabilization. Continue Seroquel 75 mg at bedtime for insomnia. Patient to follow-up with this provider in 6 weeks or sooner if clinically indicated. Agree with plan to follow-up with Marliss Czar, PhD for psychotherapy. Patient advised to contact office with any questions, adverse effects, or acute worsening in signs and symptoms.  Ineze was seen today for follow-up.  Diagnoses and all orders  for this visit:  Bipolar affective disorder, current episode manic with psychotic symptoms (HCC) -     haloperidol (HALDOL) 20 MG tablet; Take 1 tablet (20 mg total) by mouth at bedtime. -     lithium carbonate (LITHOBID) 300 MG CR tablet; Take 1 tablet (300 mg total) by mouth every morning. -     QUEtiapine (SEROQUEL) 25 MG tablet; Take 3 tablets (75  mg total) by mouth at bedtime.  Other orders -     benztropine (COGENTIN) 1 MG tablet; Take 1 tablet (1 mg total) by mouth 2 (two) times daily.     Please see After Visit Summary for patient specific instructions.  Future Appointments  Date Time Provider Department Center  07/22/2020 11:00 AM Robley FriesMitchum, Robert, PhD [facility] None  08/20/2020 11:30 AM [facility] MM 3 [facility]  09/03/2020 11:00 AM Corie Chiquitoarter, Eilis Chestnutt, PMHNP CP-[facility] None    No orders of the defined types were placed in this encounter.   -------------------------------

## 2020-07-22 ENCOUNTER — Ambulatory Visit (INDEPENDENT_AMBULATORY_CARE_PROVIDER_SITE_OTHER): Payer: Medicare Other | Admitting: Psychiatry

## 2020-07-22 ENCOUNTER — Other Ambulatory Visit: Payer: Self-pay

## 2020-07-22 DIAGNOSIS — F3161 Bipolar disorder, current episode mixed, mild: Secondary | ICD-10-CM | POA: Diagnosis not present

## 2020-07-22 DIAGNOSIS — Z636 Dependent relative needing care at home: Secondary | ICD-10-CM | POA: Diagnosis not present

## 2020-07-22 NOTE — Progress Notes (Addendum)
Psychotherapy Progress Note [facility] Marliss Czar, PhD LP  Patient ID: Tanya Simpson     MRN: [id number] Therapy format: Family therapy w/ patient -- accompanied by Sandria Bales Date: 07/22/2020      Start: 11:09a     Stop: 12:07p     Time Spent: 58 min Location: In-person   Session narrative (presenting needs, interim history, self-report of stressors and symptoms, applications of prior therapy, status changes, and interventions made in session) Recent med check indicates paranoia about husband spying on her and continuing hypomanic pressure with spending, dfx concentrating, but better mood with reduced lithium, albeit more anxiety.    Here with husband [name], 6) on invitation from initial evaluation.  Pt's take is she invited H in for him to get help for himself.  H is tearful off the mark, and fairly frequent needs to redirect the session given tendencies on both their parts to add material, points of distress, and Tanya Simpson's tendency to tangential remarks, air-filling, and unaware of interrupting her husband in the midst of painful emotional expression.  Concentrated mainly on the surprise need at this time for H to cope better with what seems to be PTSD on his part, attributed to Goddess's suicide attempt 5 months ago.   Reviewed history together in attempt to provide validation and support for a man who seems half shattered as a caregiver.  In 2013, she called for help after overdosing, but this time she had the door locked and was unconscious in their shared bed, the dog standing by, and would have been found dead had [name] not had an eerie feeling while away at work, gone home early to check on her, gotten in the door, called 911, and personally performed CPR to revive her while awaiting the EMTs.  Says he couldn't be in the bed by himself for the 3 weeks she was away in hospital, as it was the scene of nearly going through traumatic loss and witnessing his potentially lifeless  wife.  Says he keeps replaying what happened and what could have happened throughout the last 5 months, to his repeated distress.  [place] interjects that he has an anger problem, which he acknowledges, and that he has made what he considers to be idle suicide threats of his own to deal with emotional turmoil and distress together and defend against intolerable feelings of vulnerability) but he loves her and depends on her intimately.  Says she continues to believe she is being surveilled, not sure he can take it any more.  Get scared when Tanya Simpson is alone at home.  Fears the bed as the place where her OD happened.    Validated David's trauma and, where possible, framed it as a valid need for help, both through any appropriate psychotherapy he may get, either individually or as a couple, and through considerate, collaborative efforts they make to sure up intimacy, confront memory triggers, and live into her credible willingness to live now.  Suggested that it would help if Tanya Simpson can slow down and speak empathetically to him about his feelings, acknowledging that the incident was scarring to him and that he does seem to love and need her terribly.  For his sake, encouraged [name] to work at paying attention to signs of willingness to live and engagement, as often and is vividly as possible, to offset the feeling of loss of potential loss and being alone in his trauma.  As support s go, [name] has no connections after 3 years  here, but beginning to make friends with a Radio broadcast assistant.  They did visit his son in [place] recently and several friends.  Plans to visit [place].  [name] feels homesick for [place].  Alludes to family issues, notes that both he and Tanya Simpson are only children, together now 16 years, apparently after other relationships which may well have their own pain.  Discussed the availability of separate therapy for him, making recommendations.  Given insurance conditions, it may be workable for mutual treatment to occur  in the context of family therapy under Tanya Simpson's name, but it would be most proper if [name] could engage in individual therapy himself.  Given the constellation of needs in this case, I am willing to see them together, if primarily to reduce chaos, improve working alliance, and coach habits of relating to one another that improve their mutual support to each other, personal wellbeing, and resistance to high anxiety and catastrophic reactions.  Discussed availability and encouraged early return, but Tanya Simpson indicated willingness only to return in about 6 weeks' time.  Provided emergency contacts in case of need, and reminded that they can be worked in more quickly if it would help prevent or reduce crisis and encouraged to be willing to alter plans if safety is at stake.  Meanwhile, offered ideas to help resolve traumatic memory and reframe status living together (below).  Therapeutic modalities: Cognitive Behavioral Therapy and Solution-Oriented/Positive Psychology  Mental Status/Observations:  Appearance:   Casual     Behavior:  Intrusive  Motor:  Normal  Speech/Language:   generlly clear, goal-directed, sometimes irrelevant  Affect:  Appropriate  Mood:  euthymic  Thought process:  tangential  Thought content:    Tangential  Sensory/Perceptual disturbances:    WNL  Orientation:  grossly intact  Attention:  Fair    Concentration:  Fair  Memory:  Not assessed  Insight:    Fair  Judgment:   Fair  Impulse Control:  Fair   Risk Assessment: Danger to Self: No Self-injurious Behavior: No Danger to Others: No Physical Aggression / Violence: No Duty to Warn: No Access to Firearms a concern: No  Assessment of progress:  stabilized  Diagnosis:   ICD-10-CM   1. Bipolar disorder, current episode mixed, mild (HCC)  F31.61   2. Caregiver stress  Z63.6    Plan:  . [name] explore individual therapy options, e.g, EAP benefit through work.  Allowable to see the 2 of them together, if nothing else is  feasible.  Can see his prist, meanwhile, for personal support. Camelia Eng try to recognize opportunity and need for empathy . Both prioritize positive time together and attention to pleasant experiences despite the shadow of trauma . [name] work at noticing signs of life and willingness to live in [place], even if she continues to show disordered behavior and attention, as she has credibly indicated willingness to live . May recruit priest to do a home blessing, particularly the bedroom and bed where he found Tanya Simpson . Abide by mutual pledge not to resort to suicidal gestures or attempts to resolve distress but be willing to seek each other's help, use national suicide hotline, or call service . Other recommendations/advice as may be noted above . Continue to utilize previously learned skills ad lib . Maintain medication as prescribed and work faithfully with relevant prescriber(s) if any changes are desired or seem indicated . Call the clinic on-call service, present to ER, or call 911 if any life-threatening psychiatric crisis Return in about 6 weeks (around 09/02/2020) for  available earlier @ PT's need, recommend earlier. . Already scheduled visit in this office 09/03/2020.  Robley Fries, PhD Marliss Czar, PhD LP Clinical Psychologist, [facility] [address] [place], [place] [address] [phone]

## 2020-08-20 ENCOUNTER — Ambulatory Visit: Payer: Medicare Other

## 2020-08-27 IMAGING — CT CT HEAD W/O CM
3 series · 16 of 47 positions shown, 19 images · non-contrast
Comparison: None.

CLINICAL DATA: Altered level of consciousness, possible overdose

EXAM:
CT HEAD WITHOUT CONTRAST
TECHNIQUE: Contiguous axial images were obtained from the base of the skull
through the vertex without intravenous contrast.

[Series 2: head wo · axial · 0.40mm/px · z∈[-153,-28]mm · 10 of 31 slices shown, 13 images]
[im 3/31  brain]
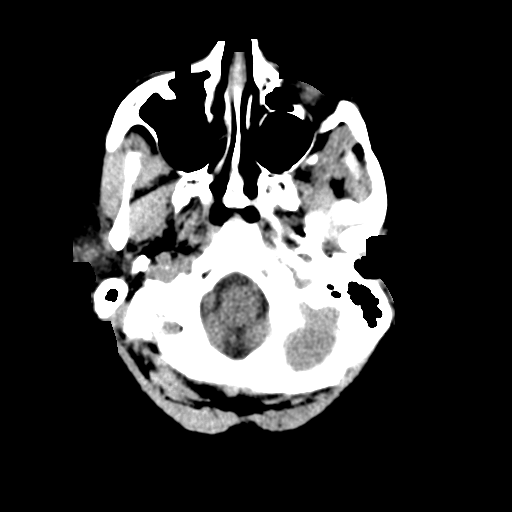
[im 3/31  bone]
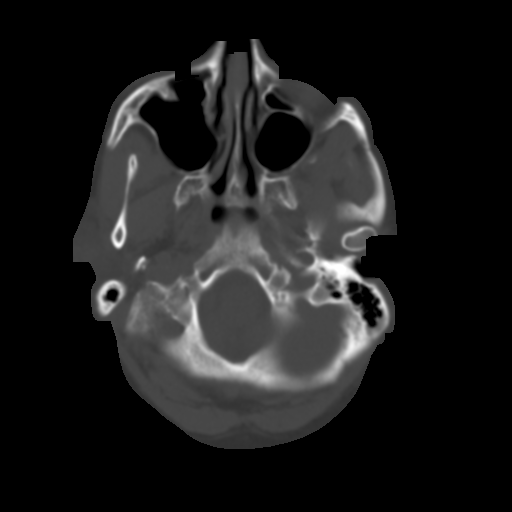
[im 6/31  brain]
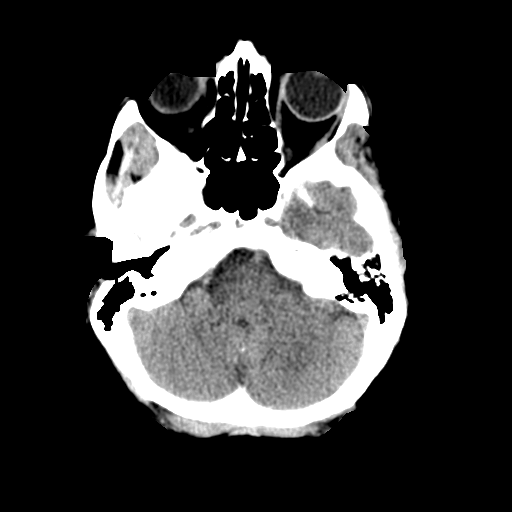
[im 9/31  brain]
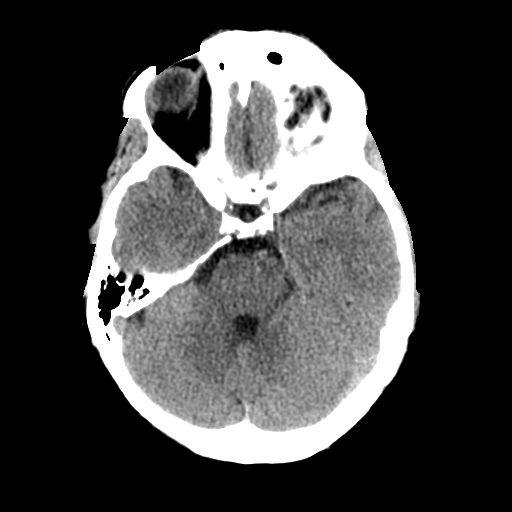
[im 11/31  brain]
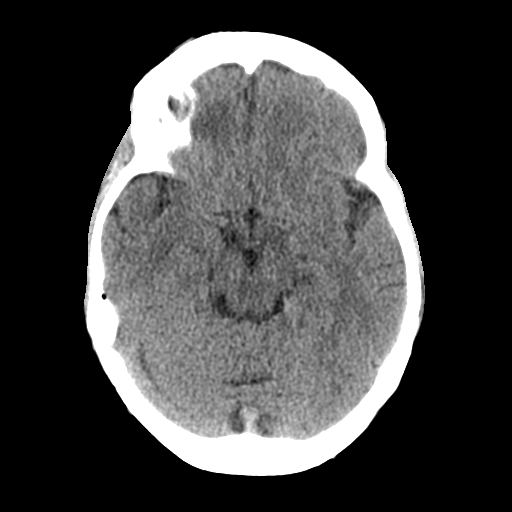
[im 14/31  brain]
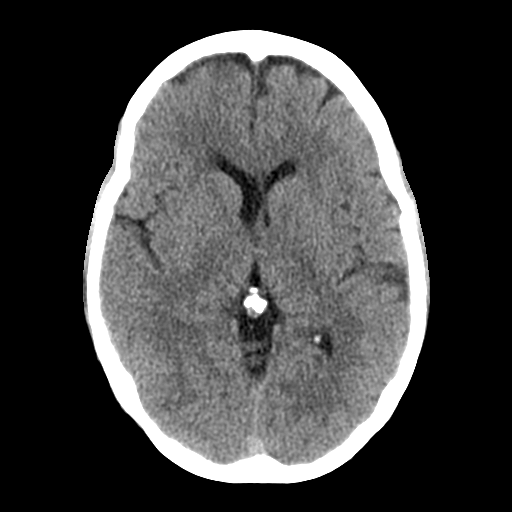
[im 14/31  bone]
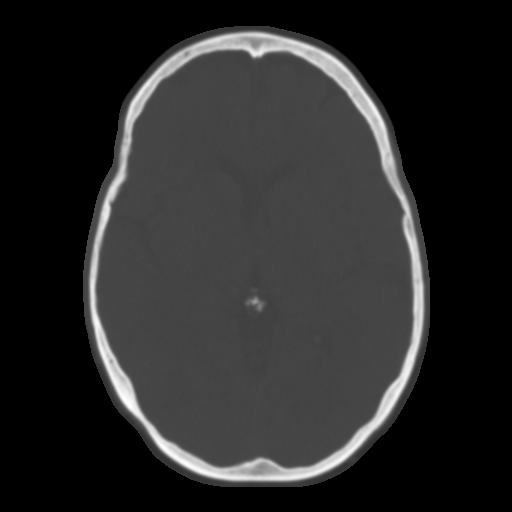
[im 17/31  brain]
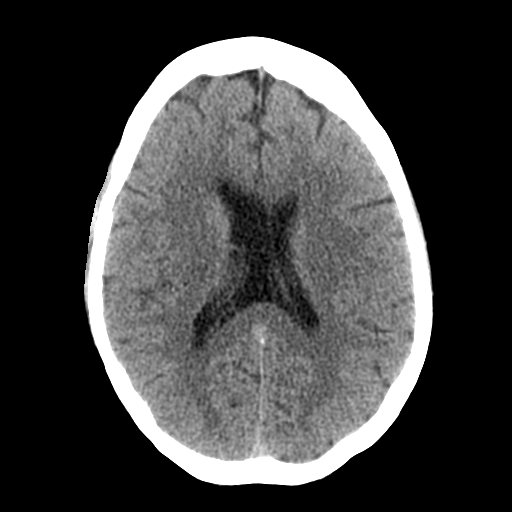
[im 20/31  brain]
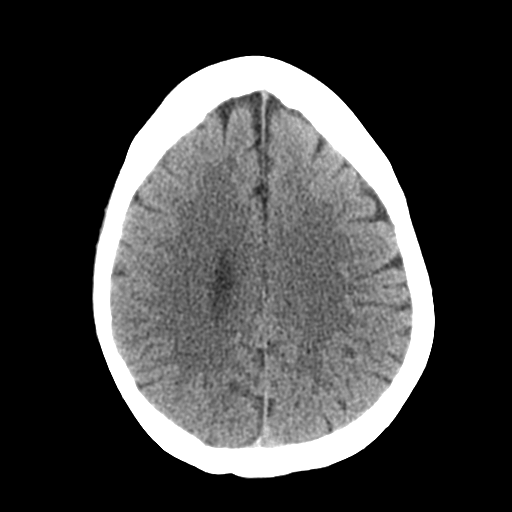
[im 23/31  brain]
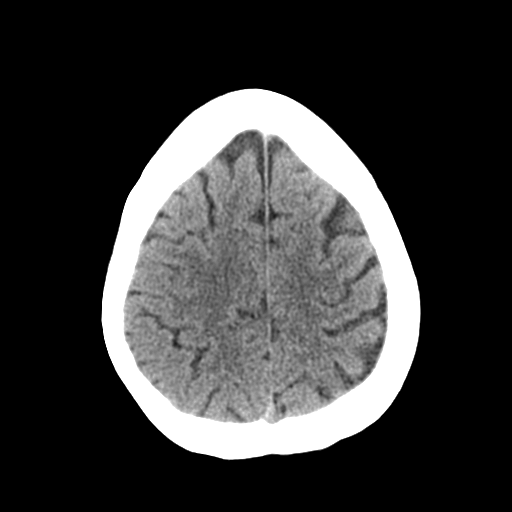
[im 25/31  brain]
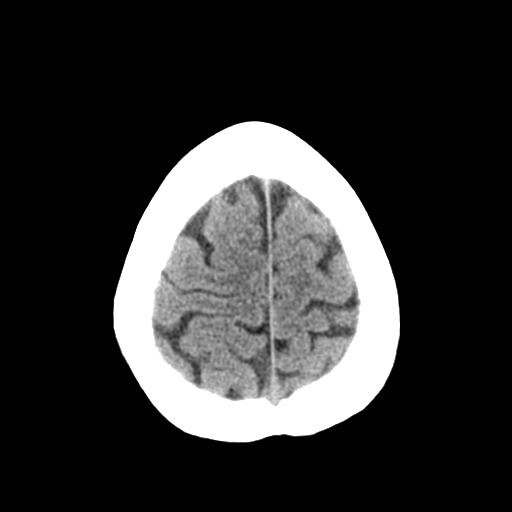
[im 25/31  bone]
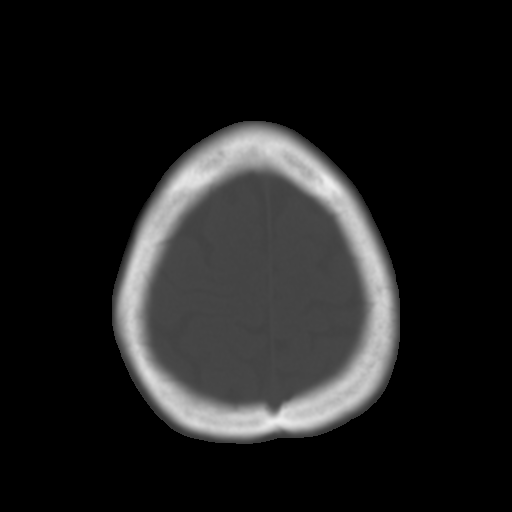
[im 28/31  brain]
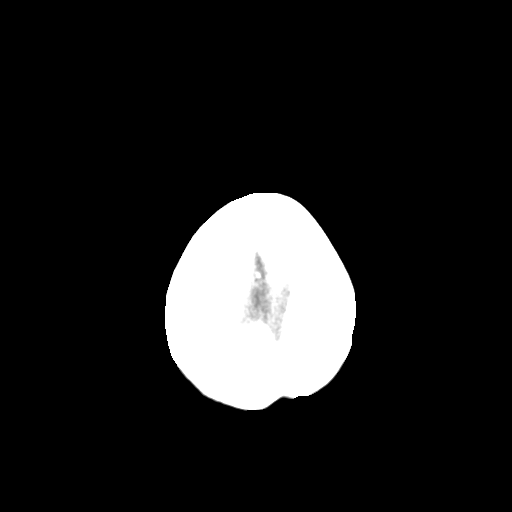

[Series 5: coronal soft tissue · coronal · 0.28mm/px · 3 of 66 slices shown]
[im 22/66  brain]
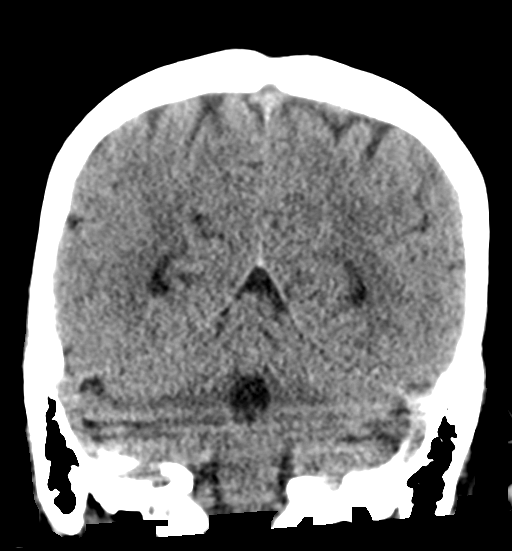
[im 29/66  brain]
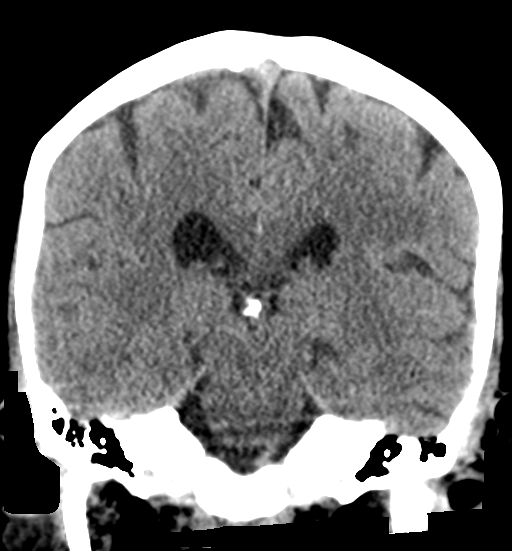
[im 37/66  brain]
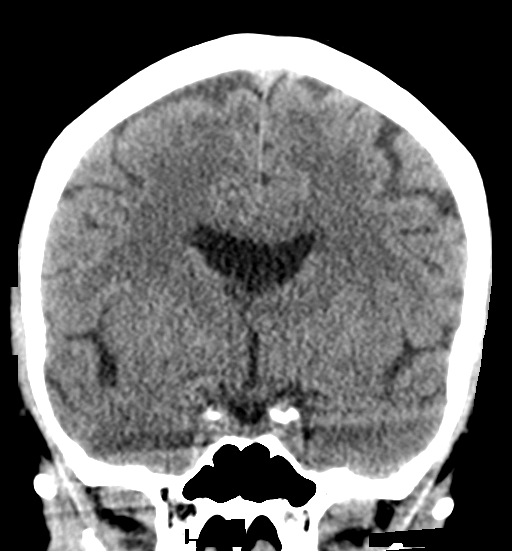

[Series 6: sagittal soft tissue · sagittal · 0.30mm/px · 3 of 49 slices shown]
[im 17/49  brain]
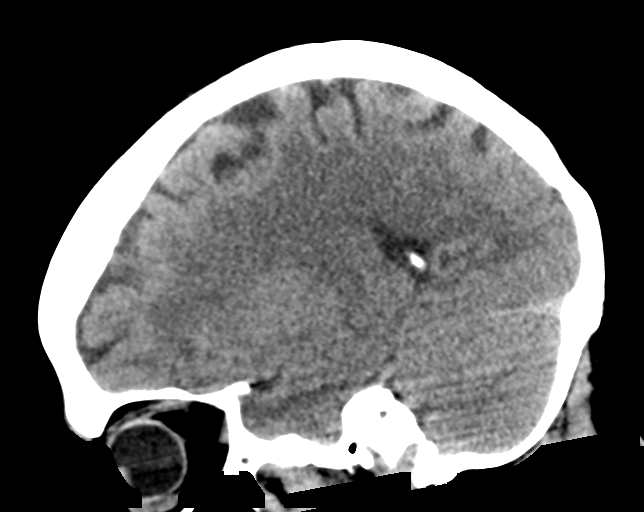
[im 25/49  brain]
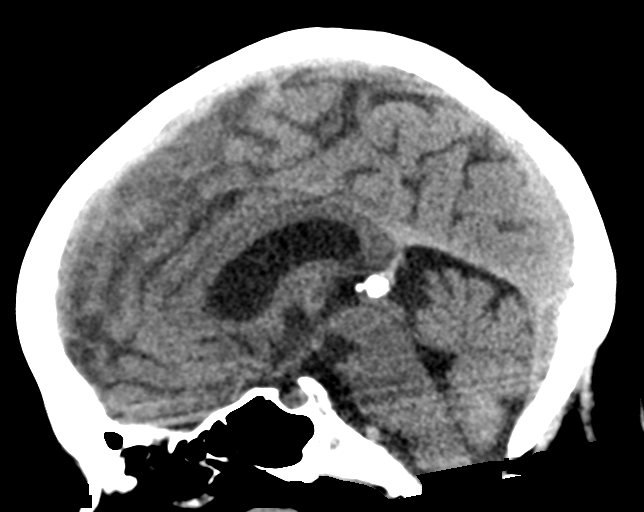
[im 33/49  brain]
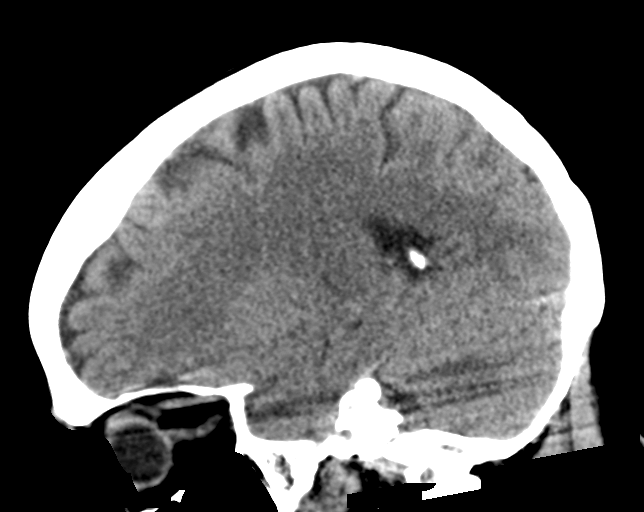

[16 of 47 positions shown; findings below may reference images not displayed]

FINDINGS: Brain: No acute infarct or hemorrhage. Lateral ventricles and
midline structures are unremarkable. No acute extra-axial fluid
collections. No mass effect.

Vascular: No hyperdense vessel or unexpected calcification.

Skull: Normal. Negative for fracture or focal lesion.

Sinuses/Orbits: No acute finding.

Other: None.
IMPRESSION: 1. No acute intracranial process.

## 2020-09-03 ENCOUNTER — Ambulatory Visit (INDEPENDENT_AMBULATORY_CARE_PROVIDER_SITE_OTHER): Payer: Medicare Other | Admitting: Psychiatry

## 2020-09-03 ENCOUNTER — Encounter: Payer: Self-pay | Admitting: Psychiatry

## 2020-09-03 ENCOUNTER — Other Ambulatory Visit: Payer: Self-pay

## 2020-09-03 DIAGNOSIS — G2401 Drug induced subacute dyskinesia: Secondary | ICD-10-CM

## 2020-09-03 DIAGNOSIS — F312 Bipolar disorder, current episode manic severe with psychotic features: Secondary | ICD-10-CM | POA: Diagnosis not present

## 2020-09-03 NOTE — Progress Notes (Signed)
Mychele Seyller [id number] Apr 09, 1961 58 y.o.  Subjective:   Patient ID:  Tanya Simpson is a 59 y.o. (DOB [birth year]) female.  Chief Complaint:  Chief Complaint  Patient presents with  . Follow-up    Mood disturbance, anxiety    HPI Tanya Simpson presents to the office today for follow-up of mood disturbance, anxiety, and sleep disturbance.  She reports that her mood is "good, better. I am still kind of lonely in the mornings." She reports that she misses her past routines in the mornings. She reports that she will try to call a friend in the morning to decrease feelings of loneliness. Denies depressed mood. She reports that she has some anxiety in crowds. Denies irritability. She reports that she is not sure if mood is elevated at times.   She is not currently working and has been applying for jobs. Waking up tired in the morning and this is less when she takes HS medications earlier. She reports that she is tired in the morning and that energy improves later in the day. She reports that she is sleeping 9-10 hours a night. Appetite has been the same. She reports that she does not read as much as she used to. Reports that she has been concentrating on finding a job. Denies paranoia. Denies SI.   She reports that she enjoyed her trip to [place] to see her son. She reports that she and her husband have been getting along ok. They have been going to church together since husband now has Sundays off.  She reports that she has not been to [facility] recently. One friend has cancer. Another friend's husband died.    AIMS     Office Visit from 03/25/2020 in [facility] Office Visit from 01/17/2020 in [facility] Office Visit from 11/09/2019 in [facility] Office Visit from 04/18/2019 in [facility]  AIMS Total Score 0 1 2 0    GAD-7     Office Visit from 04/20/2018 in [facility] Office Visit from 03/28/2018 in [facility] Office Visit from 03/14/2018 in [facility] Office Visit from 02/28/2018 in [facility] Office Visit from 10/11/2017 in [facility]  Total GAD-7 Score 3 3 1 3 3     PHQ2-9     Office Visit from 07/04/2020 in [facility] Office Visit from 03/26/2020 in [facility] Office Visit from 05/30/2019 in [facility] Office Visit from 12/19/2018 in [facility] Office Visit from 04/20/2018 in [facility]  PHQ-2 Total Score 1 1 0 0 1  PHQ-9 Total Score 5 -- -- -- 3       Review of Systems:  Review of Systems  Gastrointestinal: Negative.   Musculoskeletal: Negative for gait problem.       Leg cramp  Neurological: Negative for tremors.  Psychiatric/Behavioral:       Please refer to HPI    Medications: I have reviewed the patient's current medications.  Current Outpatient Medications  Medication Sig Dispense Refill  . benztropine (COGENTIN) 1 MG tablet Take 1 tablet (1 mg total) by mouth 2 (two) times daily. 60 tablet 1  . loratadine (CLARITIN) 10 MG tablet Take 10 mg by mouth daily as needed.     . Prenatal Vit-Fe Fumarate-FA (PRENATAL VITAMINS) 28-0.8 MG TABS Take 1 tablet by mouth daily. 90 tablet  1  . QUEtiapine (SEROQUEL) 25 MG tablet Take 3 tablets (75 mg total) by mouth at bedtime. 90 tablet 1  . haloperidol (HALDOL) 20 MG tablet Take 1 tablet (20 mg total) by mouth at bedtime. 30 tablet 1  . lithium carbonate (LITHOBID) 300 MG CR tablet Take 1 tablet (300 mg total) by mouth every morning. 30 tablet 1   No current facility-administered medications for this visit.    Medication Side Effects: Other: Drowsiness upon awakening  Allergies: No Known Allergies  Past Medical History:  Diagnosis Date  . Allergic rhinitis   . Anxiety   . Depression   . Schizophrenia (HCC)     Family History  Problem  Relation Age of Onset  . Hypertension Mother   . Impulse control disorder Mother   . CVA Father   . Depression Father     Social History   Socioeconomic History  . Marital status: Married    Spouse name: [name]  . Number of children: 2  . Years of education: 16  . Highest education level: Bachelor's degree (e.g., BA, AB, BS)  Occupational History  . Occupation: [employer]  Tobacco Use  . Smoking status: Former Smoker    Packs/day: 1.00    Years: 1.00    Pack years: 1.00  . Smokeless tobacco: Never Used  Vaping Use  . Vaping Use: Never used  Substance and Sexual Activity  . Alcohol use: Yes    Alcohol/week: 1.0 standard drink    Types: 1 Glasses of wine per week    Comment: very little  . Drug use: No  . Sexual activity: Yes    Partners: Male  Other Topics Concern  . Not on file  Social History Narrative   Patient lives with her husband here in [place], his name is [name].    Patient works as a Holiday representative at [employer].    Patient is recently from [place].       Hopeful to get back to exercising once the weather cools down.    She was participating in [employer], however the pandemic has made this difficult for her.    Social Determinants of Health   Financial Resource Strain:   . Difficulty of Paying Living Expenses: Not on file  Food Insecurity:   . Worried About Programme researcher, broadcasting/film/video in the Last Year: Not on file  . Ran Out of Food in the Last Year: Not on file  Transportation Needs:   . Lack of Transportation (Medical): Not on file  . Lack of Transportation (Non-Medical): Not on file  Physical Activity:   . Days of Exercise per Week: Not on file  . Minutes of Exercise per Session: Not on file  Stress:   . Feeling of Stress : Not on file  Social Connections:   . Frequency of Communication with Friends and Family: Not on file  . Frequency of Social Gatherings with Friends and Family: Not on file  . Attends Religious Services: Not on file  . Active  Member of Clubs or Organizations: Not on file  . Attends Banker Meetings: Not on file  . Marital Status: Not on file  Intimate Partner Violence:   . Fear of Current or Ex-Partner: Not on file  . Emotionally Abused: Not on file  . Physically Abused: Not on file  . Sexually Abused: Not on file    Past Medical History, Surgical history, Social history, and Family history were reviewed and updated as  appropriate.   Please see review of systems for further details on the patient's review from today.   Objective:   Physical Exam:  There were no vitals taken for this visit.  Physical Exam Constitutional:      General: She is not in acute distress. Musculoskeletal:        General: No deformity.  Neurological:     Mental Status: She is alert and oriented to person, place, and time.     Coordination: Coordination normal.  Psychiatric:        Attention and Perception: Attention and perception normal. She does not perceive auditory or visual hallucinations.        Mood and Affect: Mood is anxious. Mood is not depressed. Affect is not labile, blunt, angry or inappropriate.        Speech: Speech normal.        Behavior: Behavior normal.        Thought Content: Thought content normal. Thought content is not paranoid or delusional. Thought content does not include homicidal or suicidal ideation. Thought content does not include homicidal or suicidal plan.        Cognition and Memory: Cognition and memory normal.        Judgment: Judgment normal.     Comments: Insight intact     Lab Review:     Component Value Date/Time   NA 135 04/01/2020 0348   NA 136 03/26/2020 1226   K 3.9 04/01/2020 0348   CL 101 04/01/2020 0348   CO2 27 04/01/2020 0348   GLUCOSE 105 (H) 04/01/2020 0348   BUN 7 04/01/2020 0348   BUN 10 03/26/2020 1226   CREATININE 0.65 04/01/2020 0348   CALCIUM 8.9 04/01/2020 0348   PROT 6.9 04/01/2020 0348   PROT 7.7 03/26/2020 1226   ALBUMIN 3.7 04/01/2020  0348   ALBUMIN 4.7 03/26/2020 1226   AST 34 04/01/2020 0348   ALT 34 04/01/2020 0348   ALKPHOS 65 04/01/2020 0348   BILITOT 0.5 04/01/2020 0348   BILITOT 0.3 03/26/2020 1226   GFRNONAA >60 04/01/2020 0348   GFRAA >60 04/01/2020 0348       Component Value Date/Time   WBC 10.7 (H) 04/01/2020 0348   RBC 4.01 04/01/2020 0348   HGB 12.5 04/01/2020 0348   HGB 13.5 03/26/2020 1226   HCT 37.4 04/01/2020 0348   HCT 40.6 03/26/2020 1226   PLT 331 04/01/2020 0348   PLT 399 03/26/2020 1226   MCV 93.3 04/01/2020 0348   MCV 91 03/26/2020 1226   MCH 31.2 04/01/2020 0348   MCHC 33.4 04/01/2020 0348   RDW 12.2 04/01/2020 0348   RDW 11.8 03/26/2020 1226   LYMPHSABS 0.8 03/28/2020 1646   MONOABS 0.8 03/28/2020 1646   EOSABS 0.0 03/28/2020 1646   BASOSABS 0.0 03/28/2020 1646    Lithium Lvl  Date Value Ref Range Status  04/18/2020 0.82 0.60 - 1.20 mmol/L Final    Comment:    Performed at [facility], [address] [place], [place] [address]     No results found for: PHENYTOIN, PHENOBARB, VALPROATE, CBMZ   .res Assessment: Plan:   Pt reports that both she and her husband would like to continue current medications without any changes since she reports that s/s have been better controlled.  Continue Haldol 20 mg po QHS. Continue Seroquel 75 mg po QHS. Continue Lithium CR 300 mg daily for mood stabilization.  Pt to follow-up in 2 months or sooner if clinically indicated.  Patient  advised to contact office with any questions, adverse effects, or acute worsening in signs and symptoms.  Tanya Simpson was seen today for follow-up.  Diagnoses and all orders for this visit:  Tardive dyskinesia -     benztropine (COGENTIN) 1 MG tablet; Take 1 tablet (1 mg total) by mouth 2 (two) times daily.  Bipolar affective disorder, current episode manic with psychotic symptoms (HCC) -     haloperidol (HALDOL) 20 MG tablet; Take 1 tablet (20 mg total) by mouth at bedtime. -     lithium carbonate  (LITHOBID) 300 MG CR tablet; Take 1 tablet (300 mg total) by mouth every morning. -     QUEtiapine (SEROQUEL) 25 MG tablet; Take 3 tablets (75 mg total) by mouth at bedtime.     Please see After Visit Summary for patient specific instructions.  Future Appointments  Date Time Provider Department Center  09/10/2020  1:00 PM Robley Fries, PhD [facility] None  10/15/2020  3:00 PM [facility] MM 2 [facility]  11/05/2020 11:30 AM Corie Chiquito, PMHNP CP-[facility] None    No orders of the defined types were placed in this encounter.   -------------------------------

## 2020-09-06 MED ORDER — QUETIAPINE FUMARATE 25 MG PO TABS: 75.0000 mg | ORAL_TABLET | Freq: Every day | ORAL | 1 refills | Status: DC

## 2020-09-06 MED ORDER — LITHIUM CARBONATE ER 300 MG PO TBCR: 300.0000 mg | EXTENDED_RELEASE_TABLET | Freq: Every morning | ORAL | 1 refills | Status: DC

## 2020-09-06 MED ORDER — HALOPERIDOL 20 MG PO TABS: 20.0000 mg | ORAL_TABLET | Freq: Every day | ORAL | 1 refills | Status: DC

## 2020-09-06 MED ORDER — BENZTROPINE MESYLATE 1 MG PO TABS: 1.0000 mg | ORAL_TABLET | Freq: Two times a day (BID) | ORAL | 1 refills | Status: DC

## 2020-09-08 DIAGNOSIS — W540XXA Bitten by dog, initial encounter: Secondary | ICD-10-CM | POA: Diagnosis not present

## 2020-09-08 DIAGNOSIS — Z23 Encounter for immunization: Secondary | ICD-10-CM | POA: Diagnosis not present

## 2020-09-10 ENCOUNTER — Ambulatory Visit: Payer: Medicare Other | Admitting: Psychiatry

## 2020-10-08 DIAGNOSIS — Z1152 Encounter for screening for COVID-19: Secondary | ICD-10-CM | POA: Diagnosis not present

## 2020-10-15 ENCOUNTER — Ambulatory Visit: Payer: Medicare Other

## 2020-11-05 ENCOUNTER — Other Ambulatory Visit: Payer: Self-pay

## 2020-11-05 ENCOUNTER — Ambulatory Visit (INDEPENDENT_AMBULATORY_CARE_PROVIDER_SITE_OTHER): Payer: Medicare Other | Admitting: Psychiatry

## 2020-11-05 ENCOUNTER — Encounter: Payer: Self-pay | Admitting: Psychiatry

## 2020-11-05 DIAGNOSIS — F312 Bipolar disorder, current episode manic severe with psychotic features: Secondary | ICD-10-CM

## 2020-11-05 DIAGNOSIS — G2401 Drug induced subacute dyskinesia: Secondary | ICD-10-CM

## 2020-11-05 MED ORDER — BENZTROPINE MESYLATE 1 MG PO TABS: 1.0000 mg | ORAL_TABLET | Freq: Two times a day (BID) | ORAL | 0 refills | Status: DC

## 2020-11-05 MED ORDER — QUETIAPINE FUMARATE 25 MG PO TABS: 75.0000 mg | ORAL_TABLET | Freq: Every day | ORAL | 0 refills | Status: DC

## 2020-11-05 MED ORDER — LITHIUM CARBONATE ER 300 MG PO TBCR: 300.0000 mg | EXTENDED_RELEASE_TABLET | Freq: Every morning | ORAL | 1 refills | Status: DC

## 2020-11-05 MED ORDER — HALOPERIDOL 20 MG PO TABS: 20.0000 mg | ORAL_TABLET | Freq: Every day | ORAL | 0 refills | Status: DC

## 2020-11-05 NOTE — Progress Notes (Signed)
   11/05/20 1156  Facial and Oral Movements  Muscles of Facial Expression 0  Lips and Perioral Area 0  Jaw 0  Tongue 0  Extremity Movements  Upper (arms, wrists, hands, fingers) 0  Lower (legs, knees, ankles, toes) 0  Trunk Movements  Neck, shoulders, hips 0  Overall Severity  Severity of abnormal movements (highest score from questions above) 0  Incapacitation due to abnormal movements 0  Patient's awareness of abnormal movements (rate only patient's report) 0  AIMS Total Score  AIMS Total Score 0

## 2020-11-05 NOTE — Progress Notes (Signed)
Tanya Simpson [id number] September 28, 1961 60 y.o.  Subjective:   Patient ID:  Tanya Simpson is a 60 y.o. (DOB 1960-11-30) female.  Chief Complaint:  Chief Complaint  Patient presents with  . Follow-up    H/o mood disturbance, anxiety, and sleep disturbance    HPI Tanya Simpson presents to the office today for follow-up of mood disturbance, anxiety, and sleep disturbance. She has been working at [employer] for about 5 weeks. 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 Working 2-3 days a week for about 4 hours. Works in Public affairs consultant. She reports that she has been able to concentrate ok at work. She would like to start reading again. She reports that her mood has been "good" overall. She had some worry about her father and this has decreased now that his health has improved. She reports some weight gain. She reports some increased spending. Denies any other impulsive behavior or risky behavior. Sleep has been adequate overall. Denies SI.   She reports that she feels "tired." Has not been able to exercise recently.   Denies paranoia.   Father had COVID and has recovered. Planning to travel to [place] in 2 weeks. Reports that her dog's behavior has improved with using a trainer. Her dog has started to offer more emotional support.   AIMS   Flowsheet Row Office Visit from 11/05/2020 in [facility] Office Visit from 03/25/2020 in [facility] Office Visit from 01/17/2020 in [facility] Office Visit from 11/09/2019 in [facility] Office Visit from 04/18/2019 in [facility]  AIMS Total Score 0 0 1 2 0    GAD-7   Flowsheet Row Office Visit from 04/20/2018 in [facility] Office Visit from 03/28/2018 in [facility] Office Visit from 03/14/2018 in [facility] Office Visit from 02/28/2018 in [facility] Office Visit  from 10/11/2017 in [facility]  Total GAD-7 Score 3 3 1 3 3     PHQ2-9   Flowsheet Row Office Visit from 07/04/2020 in [facility] Office Visit from 03/26/2020 in [facility] Office Visit from 05/30/2019 in [facility] Office Visit from 12/19/2018 in [facility] Office Visit from 04/20/2018 in [facility]  PHQ-2 Total Score 1 1 0 0 1  PHQ-9 Total Score 5 - - - 3       Review of Systems:  Review of Systems  Gastrointestinal: Negative.   Musculoskeletal: Negative for gait problem.  Neurological: Negative for tremors.  Psychiatric/Behavioral:       Please refer to HPI    Medications: I have reviewed the patient's current medications.  Current Outpatient Medications  Medication Sig Dispense Refill  . Prenatal Vit-Fe Fumarate-FA (PRENATAL VITAMINS) 28-0.8 MG TABS Take 1 tablet by mouth daily. 90 tablet 1  . benztropine (COGENTIN) 1 MG tablet Take 1 tablet (1 mg total) by mouth 2 (two) times daily. 180 tablet 0  . haloperidol (HALDOL) 20 MG tablet Take 1 tablet (20 mg total) by mouth at bedtime. 90 tablet 0  . lithium carbonate (LITHOBID) 300 MG CR tablet Take 1 tablet (300 mg total) by mouth every morning. 30 tablet 1  . loratadine (CLARITIN) 10 MG tablet Take 10 mg by mouth daily as needed.  (Patient not taking: Reported on 11/05/2020)    . QUEtiapine (SEROQUEL) 25 MG  tablet Take 3 tablets (75 mg total) by mouth at bedtime. 270 tablet 0   No current facility-administered medications for this visit.    Medication Side Effects: Fatigue  Allergies: No Known Allergies  Past Medical History:  Diagnosis Date  . Allergic rhinitis   . Anxiety   . Depression   . Schizophrenia (HCC)     Family History  Problem Relation Age of Onset  . Hypertension Mother   . Impulse control disorder Mother   . CVA Father   . Depression Father     Social History    Socioeconomic History  . Marital status: Married    Spouse name: Tanya Simpson  . Number of children: 2  . Years of education: 16  . Highest education level: Bachelor's degree (e.g., BA, AB, BS)  Occupational History  . Occupation: [employer]  Tobacco Use  . Smoking status: Former Smoker    Packs/day: 1.00    Years: 1.00    Pack years: 1.00  . Smokeless tobacco: Never Used  Vaping Use  . Vaping Use: Never used  Substance and Sexual Activity  . Alcohol use: Yes    Alcohol/week: 1.0 standard drink    Types: 1 Glasses of wine per week    Comment: very little  . Drug use: No  . Sexual activity: Yes    Partners: Male  Other Topics Concern  . Not on file  Social History Narrative   Patient lives with her husband here in [place], his name is Tanya Simpson.    Patient works as a Holiday representative at [employer].    Patient is recently from [place].       Hopeful to get back to exercising once the weather cools down.    She was participating in [employer], however the pandemic has made this difficult for her.    Social Determinants of Health   Financial Resource Strain: Not on file  Food Insecurity: Not on file  Transportation Needs: Not on file  Physical Activity: Not on file  Stress: Not on file  Social Connections: Not on file  Intimate Partner Violence: Not on file    Past Medical History, Surgical history, Social history, and Family history were reviewed and updated as appropriate.   Please see review of systems for further details on the patient's review from today.   Objective:   Physical Exam:  There were no vitals taken for this visit.  Physical Exam Constitutional:      General: She is not in acute distress. Musculoskeletal:        General: No deformity.  Neurological:     Mental Status: She is alert and oriented to person, place, and time.     Coordination: Coordination normal.  Psychiatric:        Attention and Perception: Attention and perception normal. She does  not perceive auditory or visual hallucinations.        Mood and Affect: Mood is anxious. Mood is not depressed. Affect is not labile, blunt, angry or inappropriate.        Speech: Speech is rapid and pressured.        Behavior: Behavior is hyperactive.        Thought Content: Thought content normal. Thought content is not paranoid or delusional. Thought content does not include homicidal or suicidal ideation. Thought content does not include homicidal or suicidal plan.        Cognition and Memory: Cognition and memory normal.  Judgment: Judgment normal.     Comments: Insight intact     Lab Review:     Component Value Date/Time   NA 135 04/01/2020 0348   NA 136 03/26/2020 1226   K 3.9 04/01/2020 0348   CL 101 04/01/2020 0348   CO2 27 04/01/2020 0348   GLUCOSE 105 (H) 04/01/2020 0348   BUN 7 04/01/2020 0348   BUN 10 03/26/2020 1226   CREATININE 0.65 04/01/2020 0348   CALCIUM 8.9 04/01/2020 0348   PROT 6.9 04/01/2020 0348   PROT 7.7 03/26/2020 1226   ALBUMIN 3.7 04/01/2020 0348   ALBUMIN 4.7 03/26/2020 1226   AST 34 04/01/2020 0348   ALT 34 04/01/2020 0348   ALKPHOS 65 04/01/2020 0348   BILITOT 0.5 04/01/2020 0348   BILITOT 0.3 03/26/2020 1226   GFRNONAA >60 04/01/2020 0348   GFRAA >60 04/01/2020 0348       Component Value Date/Time   WBC 10.7 (H) 04/01/2020 0348   RBC 4.01 04/01/2020 0348   HGB 12.5 04/01/2020 0348   HGB 13.5 03/26/2020 1226   HCT 37.4 04/01/2020 0348   HCT 40.6 03/26/2020 1226   PLT 331 04/01/2020 0348   PLT 399 03/26/2020 1226   MCV 93.3 04/01/2020 0348   MCV 91 03/26/2020 1226   MCH 31.2 04/01/2020 0348   MCHC 33.4 04/01/2020 0348   RDW 12.2 04/01/2020 0348   RDW 11.8 03/26/2020 1226   LYMPHSABS 0.8 03/28/2020 1646   MONOABS 0.8 03/28/2020 1646   EOSABS 0.0 03/28/2020 1646   BASOSABS 0.0 03/28/2020 1646    Lithium Lvl  Date Value Ref Range Status  04/18/2020 0.82 0.60 - 1.20 mmol/L Final    Comment:    Performed at [facility], [address] [place], [place] [address]     No results found for: PHENYTOIN, PHENOBARB, VALPROATE, CBMZ   .res Assessment: Plan:   Will continue current plan of care since target signs and symptoms are well controlled with minimal tolerability issues. Will send 90-day supply of medications since pt will be traveling out of state later this month.  Pt to follow-up in 2 months or sooner if clinically indicated.  Patient advised to contact office with any questions, adverse effects, or acute worsening in signs and symptoms.    Tanya Simpson was seen today for follow-up.  Diagnoses and all orders for this visit:  Tardive dyskinesia -     benztropine (COGENTIN) 1 MG tablet; Take 1 tablet (1 mg total) by mouth 2 (two) times daily.  Bipolar affective disorder, current episode manic with psychotic symptoms (HCC) -     haloperidol (HALDOL) 20 MG tablet; Take 1 tablet (20 mg total) by mouth at bedtime. -     lithium carbonate (LITHOBID) 300 MG CR tablet; Take 1 tablet (300 mg total) by mouth every morning. -     QUEtiapine (SEROQUEL) 25 MG tablet; Take 3 tablets (75 mg total) by mouth at bedtime.     Please see After Visit Summary for patient specific instructions.  No future appointments.  No orders of the defined types were placed in this encounter.   -------------------------------

## 2020-12-29 ENCOUNTER — Other Ambulatory Visit (HOSPITAL_COMMUNITY)
Admission: RE | Admit: 2020-12-29 | Discharge: 2020-12-29 | Disposition: A | Discharge status: Home / Self Care | Payer: Medicare Other | Source: Ambulatory Visit | Attending: Family Medicine | Admitting: Family Medicine

## 2020-12-29 ENCOUNTER — Ambulatory Visit (INDEPENDENT_AMBULATORY_CARE_PROVIDER_SITE_OTHER): Payer: Medicare Other | Admitting: Student in an Organized Health Care Education/Training Program

## 2020-12-29 ENCOUNTER — Encounter: Payer: Self-pay | Admitting: Student in an Organized Health Care Education/Training Program

## 2020-12-29 ENCOUNTER — Other Ambulatory Visit: Payer: Self-pay

## 2020-12-29 VITALS — BP 125/70 | HR 70 | Ht 64.0 in | Wt 156.0 lb

## 2020-12-29 DIAGNOSIS — Z79899 Other long term (current) drug therapy: Secondary | ICD-10-CM | POA: Diagnosis not present

## 2020-12-29 DIAGNOSIS — F312 Bipolar disorder, current episode manic severe with psychotic features: Secondary | ICD-10-CM

## 2020-12-29 DIAGNOSIS — R739 Hyperglycemia, unspecified: Secondary | ICD-10-CM | POA: Diagnosis not present

## 2020-12-29 DIAGNOSIS — Z13220 Encounter for screening for lipoid disorders: Secondary | ICD-10-CM

## 2020-12-29 DIAGNOSIS — E785 Hyperlipidemia, unspecified: Secondary | ICD-10-CM | POA: Diagnosis not present

## 2020-12-29 DIAGNOSIS — R635 Abnormal weight gain: Secondary | ICD-10-CM

## 2020-12-29 DIAGNOSIS — Z1151 Encounter for screening for human papillomavirus (HPV): Secondary | ICD-10-CM | POA: Diagnosis not present

## 2020-12-29 DIAGNOSIS — N941 Unspecified dyspareunia: Secondary | ICD-10-CM

## 2020-12-29 DIAGNOSIS — Z5181 Encounter for therapeutic drug level monitoring: Secondary | ICD-10-CM

## 2020-12-29 DIAGNOSIS — E0590 Thyrotoxicosis, unspecified without thyrotoxic crisis or storm: Secondary | ICD-10-CM | POA: Diagnosis not present

## 2020-12-29 DIAGNOSIS — F909 Attention-deficit hyperactivity disorder, unspecified type: Secondary | ICD-10-CM

## 2020-12-29 DIAGNOSIS — Z124 Encounter for screening for malignant neoplasm of cervix: Secondary | ICD-10-CM

## 2020-12-29 DIAGNOSIS — K219 Gastro-esophageal reflux disease without esophagitis: Secondary | ICD-10-CM | POA: Diagnosis not present

## 2020-12-29 DIAGNOSIS — R631 Polydipsia: Secondary | ICD-10-CM | POA: Diagnosis not present

## 2020-12-29 DIAGNOSIS — Z0000 Encounter for general adult medical examination without abnormal findings: Secondary | ICD-10-CM | POA: Diagnosis not present

## 2020-12-29 LAB — POCT GLYCOSYLATED HEMOGLOBIN (HGB A1C): HbA1c, POC (controlled diabetic range): 4.9 % (ref 0.0–7.0)

## 2020-12-29 NOTE — Progress Notes (Signed)
   SUBJECTIVE:   CHIEF COMPLAINT / HPI: check up  dyspareunia LMP 2 years ago. For past year or so has had pain with occasional intercourse.   Bipolar- [facility] NP Corie Chiquito. Last Saw about 2 months ago. She is on lithium, Cogentin, Seroquel.  States that she is no longer taking Haldol. Endorses polydipsia and polyuria.  Weight gain-patient declined to be weighed today but states that her weight at home this morning was 150 pounds.  She has been trying to eat a healthier diet but seems that she continues to gain weight.  She would like to have her heart checked today.  OBJECTIVE:   BP 125/70   Pulse 70   Ht 5\' 4"  (1.626 m)   Wt 156 lb (70.8 kg) Comment: Patient refused  SpO2 98%   BMI 26.78 kg/m   Physical Exam Vitals and nursing note reviewed. Exam conducted with a chaperone present.  Constitutional:      Appearance: Normal appearance.  Cardiovascular:     Rate and Rhythm: Normal rate and regular rhythm.     Pulses: Normal pulses.     Heart sounds: Normal heart sounds, S1 normal and S2 normal.  Pulmonary:     Effort: Pulmonary effort is normal.     Breath sounds: Normal breath sounds.  Abdominal:     General: Abdomen is flat.     Palpations: Abdomen is soft.  Genitourinary:    General: Normal vulva.     Exam position: Lithotomy position.     Pubic Area: No rash.      Labia:        Right: No rash or lesion.        Left: No rash or lesion.      Vagina: Normal.     Cervix: Lesion (poly at os 6oclock position) present.  Musculoskeletal:     Right lower leg: No edema.     Left lower leg: No edema.  Skin:    General: Skin is warm and dry.     Capillary Refill: Capillary refill takes less than 2 seconds.  Neurological:     General: No focal deficit present.     Mental Status: She is alert and oriented to person, place, and time.  Psychiatric:        Mood and Affect: Mood is elated.        Speech: Speech is rapid and pressured and tangential.         Behavior: Behavior is cooperative.    ASSESSMENT/PLAN:   Weight gain Patient concerned about weight gain.  Patient agreed to be weighed today with description that it can be helpful to have this data to track her weight over the years. Discussed lifestyle interventions for healthy weight loss  Bipolar affective disorder, current episode manic with psychotic symptoms (HCC) Obtaining labs today to monitor medication effects including lithium level, CMP, CBC, lipid panel, hgb A1c  Healthcare maintenance Discussed again mammogram and colonoscopy and provided with information to complete these screenings.  Obtained pap smear. Cervix had polyp present which was brushed for sample  Dyspareunia in female Normal anatomical exam.  Recommend trial of topical lubricant and reassess if still having problems  GERD (gastroesophageal reflux disease) Provided with dietary recommendations for reducing reflux and recommended trial of pepcid.  Return if not improved with these changes     , DO [facility]

## 2020-12-29 NOTE — Patient Instructions (Signed)
It was a pleasure to see you today!  To summarize our discussion for this visit:  We are completing a pap smear today.  Please follow up with GI for colonoscopy and a mammogram to complete the cancer screening recommendations for your age.  We are checking blood work today to monitor kidney/liver/electrolytes/diabetes/cholesterol/thyroid. I will discuss your results with you via mychart or phone call  I would recommend trying pepcid or other over the counter acid reflux treatments before bedtime to see if this improves your symptoms and also make dietary changes as described below  Some additional health maintenance measures we should update are: Health Maintenance Due  Topic Date Due  . COVID-19 Vaccine (1) Never done  . TETANUS/TDAP  Never done  . PAP SMEAR-Modifier  Never done  . COLONOSCOPY (Pts 45-10yrs Insurance coverage will need to be confirmed)  Never done  . MAMMOGRAM  Never done  . INFLUENZA VACCINE  05/04/2020  .   Please return to our clinic to see [place] in 1 year or sooner if needed.  Call the clinic at [phone] if your symptoms worsen or you have any concerns.   Thank you for allowing me to take part in your care,  Dr. Jamelle Rushing   Food Choices for Gastroesophageal Reflux Disease, Adult When you have gastroesophageal reflux disease (GERD), the foods you eat and your eating habits are very important. Choosing the right foods can help ease your discomfort. Think about working with a food expert (dietitian) to help you make good choices. What are tips for following this plan? Reading food labels  Look for foods that are low in saturated fat. Foods that may help with your symptoms include: ? Foods that have less than 5% of daily value (DV) of fat. ? Foods that have 0 grams of trans fat. Cooking  Do not fry your food.  Cook your food by baking, steaming, grilling, or broiling. These are all methods that do not need a lot of fat for cooking.  To add  flavor, try to use herbs that are low in spice and acidity. Meal planning  Choose healthy foods that are low in fat, such as: ? Fruits and vegetables. ? Whole grains. ? Low-fat dairy products. ? Lean meats, fish, and poultry.  Eat small meals often instead of eating 3 large meals each day. Eat your meals slowly in a place where you are relaxed. Avoid bending over or lying down until 2-3 hours after eating.  Limit high-fat foods such as fatty meats or fried foods.  Limit your intake of fatty foods, such as oils, butter, and shortening.  Avoid the following as told by your doctor: ? Foods that cause symptoms. These may be different for different people. Keep a food diary to keep track of foods that cause symptoms. ? Alcohol. ? Drinking a lot of liquid with meals. ? Eating meals during the 2-3 hours before bed.   Lifestyle  Stay at a healthy weight. Ask your doctor what weight is healthy for you. If you need to lose weight, work with your doctor to do so safely.  Exercise for at least 30 minutes on 5 or more days each week, or as told by your doctor.  Wear loose-fitting clothes.  Do not smoke or use any products that contain nicotine or tobacco. If you need help quitting, ask your doctor.  Sleep with the head of your bed higher than your feet. Use a wedge under the mattress or blocks under the bed  frame to raise the head of the bed.  Chew sugar-free gum after meals. What foods should eat? Eat a healthy, well-balanced diet of fruits, vegetables, whole grains, low-fat dairy products, lean meats, fish, and poultry. Each person is different. Foods that may cause symptoms in one person may not cause any symptoms in another person. Work with your doctor to find foods that are safe for you. The items listed above may not be a complete list of what you can eat and drink. Contact a food expert for more options.   What foods should I avoid? Limiting some of these foods may help in managing  the symptoms of GERD. Everyone is different. Talk with a food expert or your doctor to help you find the exact foods to avoid, if any. Fruits Any fruits prepared with added fat. Any fruits that cause symptoms. For some people, this may include citrus fruits, such as oranges, grapefruit, pineapple, and lemons. Vegetables Deep-fried vegetables. [place] fries. Any vegetables prepared with added fat. Any vegetables that cause symptoms. For some people, this may include tomatoes and tomato products, chili peppers, onions and garlic, and horseradish. Grains Pastries or quick breads with added fat. Meats and other proteins High-fat meats, such as fatty beef or pork, hot dogs, ribs, ham, sausage, salami, and bacon. Fried meat or protein, including fried fish and fried chicken. Nuts and nut butters, in large amounts. Dairy Whole milk and chocolate milk. Sour cream. Cream. Ice cream. Cream cheese. Milkshakes. Fats and oils Butter. Margarine. Shortening. Ghee. Beverages Coffee and tea, with or without caffeine. Carbonated beverages. Sodas. Energy drinks. Fruit juice made with acidic fruits, such as orange or grapefruit. Tomato juice. Alcoholic drinks. Sweets and desserts Chocolate and cocoa. Donuts. Seasonings and condiments Pepper. Peppermint and spearmint. Added salt. Any condiments, herbs, or seasonings that cause symptoms. For some people, this may include curry, hot sauce, or vinegar-based salad dressings. The items listed above may not be a complete list of what you should not eat and drink. Contact a food expert for more options. Questions to ask your doctor Diet and lifestyle changes are often the first steps that are taken to manage symptoms of GERD. If diet and lifestyle changes do not help, talk with your doctor about taking medicines. Where to find more information  International Foundation for Gastrointestinal Disorders: aboutgerd.org Summary  When you have GERD, food and lifestyle  choices are very important in easing your symptoms.  Eat small meals often instead of 3 large meals a day. Eat your meals slowly and in a place where you are relaxed.  Avoid bending over or lying down until 2-3 hours after eating.  Limit high-fat foods such as fatty meats or fried foods. This information is not intended to replace advice given to you by your health care provider. Make sure you discuss any questions you have with your health care provider. Document Revised: 03/31/2020 Document Reviewed: 03/31/2020 Elsevier Patient Education  2021 [employer].

## 2020-12-30 LAB — CBC WITH DIFFERENTIAL
Basophils Absolute: 0.1 10*3/uL (ref 0.0–0.2)
Basos: 1 %
EOS (ABSOLUTE): 0.3 10*3/uL (ref 0.0–0.4)
Eos: 3 %
Hematocrit: 39.1 % (ref 34.0–46.6)
Hemoglobin: 13.3 g/dL (ref 11.1–15.9)
Immature Grans (Abs): 0 10*3/uL (ref 0.0–0.1)
Immature Granulocytes: 0 %
Lymphocytes Absolute: 2.4 10*3/uL (ref 0.7–3.1)
Lymphs: 25 %
MCH: 31.6 pg (ref 26.6–33.0)
MCHC: 34 g/dL (ref 31.5–35.7)
MCV: 93 fL (ref 79–97)
Monocytes Absolute: 0.6 10*3/uL (ref 0.1–0.9)
Monocytes: 6 %
Neutrophils Absolute: 6.2 10*3/uL (ref 1.4–7.0)
Neutrophils: 65 %
RBC: 4.21 x10E6/uL (ref 3.77–5.28)
RDW: 11.9 % (ref 11.7–15.4)
WBC: 9.5 10*3/uL (ref 3.4–10.8)

## 2020-12-30 LAB — COMPREHENSIVE METABOLIC PANEL
ALT: 15 IU/L (ref 0–32)
AST: 18 IU/L (ref 0–40)
Albumin/Globulin Ratio: 2.2 (ref 1.2–2.2)
Albumin: 4.6 g/dL (ref 3.8–4.8)
Alkaline Phosphatase: 65 IU/L (ref 44–121)
BUN/Creatinine Ratio: 19 (ref 12–28)
BUN: 15 mg/dL (ref 8–27)
Bilirubin Total: 0.2 mg/dL (ref 0.0–1.2)
CO2: 22 mmol/L (ref 20–29)
Calcium: 9.2 mg/dL (ref 8.7–10.3)
Chloride: 99 mmol/L (ref 96–106)
Creatinine, Ser: 0.78 mg/dL (ref 0.57–1.00)
Globulin, Total: 2.1 g/dL (ref 1.5–4.5)
Glucose: 85 mg/dL (ref 65–99)
Potassium: 4.2 mmol/L (ref 3.5–5.2)
Sodium: 135 mmol/L (ref 134–144)
Total Protein: 6.7 g/dL (ref 6.0–8.5)
eGFR: 86 mL/min/{1.73_m2} (ref >59)

## 2020-12-30 LAB — CYTOLOGY - PAP
Comment: NEGATIVE
Diagnosis: NEGATIVE
High risk HPV: NEGATIVE

## 2020-12-30 LAB — LIPID PANEL
Chol/HDL Ratio: 3.2 ratio (ref 0.0–4.4)
Cholesterol, Total: 198 mg/dL (ref 100–199)
HDL: 61 mg/dL (ref >39)
LDL Chol Calc (NIH): 125 mg/dL — ABNORMAL HIGH (ref 0–99)
Triglycerides: 64 mg/dL (ref 0–149)
VLDL Cholesterol Cal: 12 mg/dL (ref 5–40)

## 2020-12-30 LAB — LITHIUM LEVEL: Lithium Lvl: 0.5 mmol/L (ref 0.5–1.2)

## 2020-12-30 LAB — TSH: TSH: 3.01 u[IU]/mL (ref 0.450–4.500)

## 2020-12-31 ENCOUNTER — Encounter: Payer: Self-pay | Admitting: Student in an Organized Health Care Education/Training Program

## 2020-12-31 DIAGNOSIS — K219 Gastro-esophageal reflux disease without esophagitis: Secondary | ICD-10-CM | POA: Insufficient documentation

## 2020-12-31 DIAGNOSIS — R635 Abnormal weight gain: Secondary | ICD-10-CM | POA: Insufficient documentation

## 2020-12-31 DIAGNOSIS — N941 Unspecified dyspareunia: Secondary | ICD-10-CM | POA: Insufficient documentation

## 2020-12-31 NOTE — Assessment & Plan Note (Signed)
Obtaining labs today to monitor medication effects including lithium level, CMP, CBC, lipid panel, hgb A1c

## 2020-12-31 NOTE — Assessment & Plan Note (Signed)
Patient concerned about weight gain.  Patient agreed to be weighed today with description that it can be helpful to have this data to track her weight over the years. Discussed lifestyle interventions for healthy weight loss

## 2020-12-31 NOTE — Assessment & Plan Note (Signed)
Provided with dietary recommendations for reducing reflux and recommended trial of pepcid.  Return if not improved with these changes

## 2020-12-31 NOTE — Assessment & Plan Note (Signed)
Discussed again mammogram and colonoscopy and provided with information to complete these screenings.  Obtained pap smear. Cervix had polyp present which was brushed for sample

## 2020-12-31 NOTE — Assessment & Plan Note (Signed)
Normal anatomical exam.  Recommend trial of topical lubricant and reassess if still having problems

## 2021-01-14 ENCOUNTER — Encounter: Payer: Self-pay | Admitting: Psychiatry

## 2021-01-14 ENCOUNTER — Ambulatory Visit: Payer: Medicare Other | Admitting: Student in an Organized Health Care Education/Training Program

## 2021-01-14 ENCOUNTER — Other Ambulatory Visit: Payer: Self-pay

## 2021-01-14 ENCOUNTER — Ambulatory Visit (INDEPENDENT_AMBULATORY_CARE_PROVIDER_SITE_OTHER): Payer: Medicare Other | Admitting: Psychiatry

## 2021-01-14 DIAGNOSIS — F312 Bipolar disorder, current episode manic severe with psychotic features: Secondary | ICD-10-CM | POA: Diagnosis not present

## 2021-01-14 DIAGNOSIS — G2401 Drug induced subacute dyskinesia: Secondary | ICD-10-CM

## 2021-01-14 MED ORDER — QUETIAPINE FUMARATE 25 MG PO TABS: 75.0000 mg | ORAL_TABLET | Freq: Every day | ORAL | 0 refills | Status: DC

## 2021-01-14 MED ORDER — BENZTROPINE MESYLATE 1 MG PO TABS: 1.0000 mg | ORAL_TABLET | Freq: Two times a day (BID) | ORAL | 0 refills | Status: DC

## 2021-01-14 MED ORDER — HALOPERIDOL 20 MG PO TABS: 20.0000 mg | ORAL_TABLET | Freq: Every day | ORAL | 0 refills | Status: DC

## 2021-01-14 MED ORDER — LITHIUM CARBONATE ER 300 MG PO TBCR: 300.0000 mg | EXTENDED_RELEASE_TABLET | Freq: Every morning | ORAL | 0 refills | Status: DC

## 2021-01-14 NOTE — Progress Notes (Signed)
Tanya Simpson [id number] 09-20-1961 60 y.o.  Subjective:   Patient ID:  Tanya Simpson is a 60 y.o. (DOB 23-Sep-1961) female.  Chief Complaint:  Chief Complaint  Patient presents with  . Follow-up    H/o mood disturbance and anxiety    HPI Tanya Simpson presents to the office today for follow-up of mood disturbance and anxiety. Most recent Lithium level was 0.5 and creatinine 0.78 on 12/29/20.  She reports that her mood has been ok. She reports that she occasionally wakes up feeling sad. Denies any significant anxiety. She reports occasional difficulty with sleep initiation. She reports that she wakes up tired most mornings. She reports that they have not been spending as much. She reports sending family [place] gifts. She reports that she was referred to a nutritionist. She reports that her appetite has been "about the same." Energy is ok once she gets up and gets going. Motivation has been good. Unsure about concentration. She reports that she is learning new processes at work. Denies SI. Denies paranoia.   Went to [place] in February. 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 She is typically working 4 hour shifts. Has been reading some. She has also been knitting. Has been walking some.   Her step-son had a baby on her birthday. She has been in touch with some old friends. She reports that she does not dwell on not having contact with her daughter as much.    AIMS   Flowsheet Row Office Visit from 11/05/2020 in [facility] Office Visit from 03/25/2020 in [facility] Office Visit from 01/17/2020 in [facility] Office Visit from 11/09/2019 in [facility] Office Visit from 04/18/2019 in [facility]  AIMS Total Score 0 0 1 2 0    GAD-7   Flowsheet Row Office Visit from 04/20/2018 in [facility] Office Visit from 03/28/2018 in [facility] Office Visit from 03/14/2018 in [facility] Office Visit from 02/28/2018 in [facility] Office Visit from 10/11/2017 in [facility]  Total GAD-7 Score 3 3 1 3 3     PHQ2-9   Flowsheet Row Office Visit from 12/29/2020 in [facility] Office Visit from 07/04/2020 in [facility] Office Visit from 03/26/2020 in [facility] Office Visit from 05/30/2019 in [facility] Office Visit from 12/19/2018 in [facility]  PHQ-2 Total Score 0 1 1 0 0  PHQ-9 Total Score 2 5 -- -- --    Flowsheet Row ED to Hosp-Admission (Discharged) from 03/28/2020 in [facility]  C-SSRS RISK CATEGORY High Risk       Review of Systems:  Review of Systems  Gastrointestinal:       Occasional acid reflux  Musculoskeletal: Negative for gait problem.  Neurological: Negative for tremors.  Psychiatric/Behavioral:       Please refer to HPI    Medications: I have reviewed the patient's current medications.  Current Outpatient Medications  Medication Sig Dispense Refill  . Prenatal Vit-Fe Fumarate-FA (PRENATAL VITAMINS) 28-0.8 MG TABS Take 1 tablet by mouth daily. 90 tablet 1  . benztropine (COGENTIN) 1 MG tablet Take 1 tablet (1 mg total) by mouth 2 (two) times daily. 180 tablet 0  . haloperidol (HALDOL) 20 MG tablet Take 1 tablet (20 mg total) by mouth at bedtime. 90 tablet 0  .  lithium carbonate (LITHOBID) 300 MG CR tablet Take 1 tablet (300 mg total) by mouth every morning. 90 tablet 0  . loratadine (CLARITIN) 10 MG tablet Take 10 mg by mouth daily as needed.  (Patient not taking: No sig reported)    . QUEtiapine (SEROQUEL) 25 MG tablet Take 3 tablets (75 mg total) by mouth at bedtime. 270 tablet 0   No current facility-administered medications for this visit.    Medication Side Effects: Other: Drowsiness upon awakening  Allergies: No Known  Allergies  Past Medical History:  Diagnosis Date  . Allergic rhinitis   . Anxiety   . Depression   . Schizophrenia (HCC)     Family History  Problem Relation Age of Onset  . Hypertension Mother   . Impulse control disorder Mother   . CVA Father   . Depression Father     Social History   Socioeconomic History  . Marital status: Married    Spouse name: [name]  . Number of children: 2  . Years of education: 16  . Highest education level: Bachelor's degree (e.g., BA, AB, BS)  Occupational History  . Occupation: [employer]  Tobacco Use  . Smoking status: Former Smoker    Packs/day: 1.00    Years: 1.00    Pack years: 1.00  . Smokeless tobacco: Never Used  Vaping Use  . Vaping Use: Never used  Substance and Sexual Activity  . Alcohol use: Yes    Alcohol/week: 1.0 standard drink    Types: 1 Glasses of wine per week    Comment: very little  . Drug use: No  . Sexual activity: Yes    Partners: Male  Other Topics Concern  . Not on file  Social History Narrative   Patient lives with her husband here in [place], his name is [name].    Patient works as a Holiday representative at [employer].    Patient is recently from [place].       Hopeful to get back to exercising once the weather cools down.    She was participating in [employer], however the pandemic has made this difficult for her.    Social Determinants of Health   Financial Resource Strain: Not on file  Food Insecurity: Not on file  Transportation Needs: Not on file  Physical Activity: Not on file  Stress: Not on file  Social Connections: Not on file  Intimate Partner Violence: Not on file    Past Medical History, Surgical history, Social history, and Family history were reviewed and updated as appropriate.   Please see review of systems for further details on the patient's review from today.   Objective:   Physical Exam:  Wt 156 lb (70.8 kg)   BMI 26.78 kg/m   Physical Exam Constitutional:      General:  She is not in acute distress. Musculoskeletal:        General: No deformity.  Neurological:     Mental Status: She is alert and oriented to person, place, and time.     Coordination: Coordination normal.  Psychiatric:        Attention and Perception: Attention and perception normal. She does not perceive auditory or visual hallucinations.        Mood and Affect: Mood normal. Mood is not anxious or depressed. Affect is not labile, blunt, angry or inappropriate.        Speech: Speech is rapid and pressured.        Behavior: Behavior normal. Behavior  is cooperative.        Thought Content: Thought content normal. Thought content is not paranoid or delusional. Thought content does not include homicidal or suicidal ideation. Thought content does not include homicidal or suicidal plan.        Cognition and Memory: Cognition and memory normal.        Judgment: Judgment normal.     Comments: Insight intact     Lab Review:     Component Value Date/Time   NA 135 12/29/2020 1458   K 4.2 12/29/2020 1458   CL 99 12/29/2020 1458   CO2 22 12/29/2020 1458   GLUCOSE 85 12/29/2020 1458   GLUCOSE 105 (H) 04/01/2020 0348   BUN 15 12/29/2020 1458   CREATININE 0.78 12/29/2020 1458   CALCIUM 9.2 12/29/2020 1458   PROT 6.7 12/29/2020 1458   ALBUMIN 4.6 12/29/2020 1458   AST 18 12/29/2020 1458   ALT 15 12/29/2020 1458   ALKPHOS 65 12/29/2020 1458   BILITOT 0.2 12/29/2020 1458   GFRNONAA >60 04/01/2020 0348   GFRAA >60 04/01/2020 0348       Component Value Date/Time   WBC 9.5 12/29/2020 1458   WBC 10.7 (H) 04/01/2020 0348   RBC 4.21 12/29/2020 1458   RBC 4.01 04/01/2020 0348   HGB 13.3 12/29/2020 1458   HCT 39.1 12/29/2020 1458   PLT 331 04/01/2020 0348   PLT 399 03/26/2020 1226   MCV 93 12/29/2020 1458   MCH 31.6 12/29/2020 1458   MCH 31.2 04/01/2020 0348   MCHC 34.0 12/29/2020 1458   MCHC 33.4 04/01/2020 0348   RDW 11.9 12/29/2020 1458   LYMPHSABS 2.4 12/29/2020 1458   MONOABS 0.8  03/28/2020 1646   EOSABS 0.3 12/29/2020 1458   BASOSABS 0.1 12/29/2020 1458    Lithium Lvl  Date Value Ref Range Status  12/29/2020 0.5 0.5 - 1.2 mmol/L Final    Comment:    Plasma concentration of 0.5 - 0.8 mmol/L are advised for long-term use; concentrations of up to 1.2 mmol/L may be necessary during acute treatment.                                  Detection Limit = 0.1                           <0.1 indicates None Detected      No results found for: PHENYTOIN, PHENOBARB, VALPROATE, CBMZ   .res Assessment: Plan:   Patient seen for 30 minutes and time spent reviewing recent lab results and discussing that lab results do not indicate any adverse effects on kidney function or thyroid.  Will continue current medications since pt reports that she and her husband have noticed some improvement in mood s/s. She is now working and engaging in hobbies, self care, and health promotion.  Continue Haldol 20 mg at bedtime for mood stabilization. Continue Lithobid 300 mg in the morning for mood stabilization. Continue Seroquel 75 mg at bedtime for mood signs and symptoms. Continue Cogentin 1 mg twice daily for tardive dyskinesia. Patient to follow-up in 3 months or sooner if clinically indicated. Patient advised to contact office with any questions, adverse effects, or acute worsening in signs and symptoms.  Tanya Simpson was seen today for follow-up.  Diagnoses and all orders for this visit:  Bipolar affective disorder, current episode manic with psychotic symptoms (HCC) -  haloperidol (HALDOL) 20 MG tablet; Take 1 tablet (20 mg total) by mouth at bedtime. -     lithium carbonate (LITHOBID) 300 MG CR tablet; Take 1 tablet (300 mg total) by mouth every morning. -     QUEtiapine (SEROQUEL) 25 MG tablet; Take 3 tablets (75 mg total) by mouth at bedtime.  Tardive dyskinesia -     benztropine (COGENTIN) 1 MG tablet; Take 1 tablet (1 mg total) by mouth 2 (two) times daily.     Please see After  Visit Summary for patient specific instructions.  Future Appointments  Date Time Provider Department Center  04/15/2021 11:30 AM Corie Chiquito, PMHNP [facility] None    No orders of the defined types were placed in this encounter.   -------------------------------

## 2021-01-15 ENCOUNTER — Telehealth: Payer: Self-pay

## 2021-01-15 NOTE — Telephone Encounter (Signed)
Patient Tanya Simpson on nurse line requesting only to speak with with PCP.

## 2021-01-29 ENCOUNTER — Other Ambulatory Visit: Payer: Self-pay

## 2021-01-29 ENCOUNTER — Ambulatory Visit (INDEPENDENT_AMBULATORY_CARE_PROVIDER_SITE_OTHER): Payer: Medicare Other | Admitting: Family Medicine

## 2021-01-29 DIAGNOSIS — E663 Overweight: Secondary | ICD-10-CM | POA: Diagnosis not present

## 2021-01-29 DIAGNOSIS — Z7689 Persons encountering health services in other specified circumstances: Secondary | ICD-10-CM

## 2021-01-29 NOTE — Patient Instructions (Addendum)
Eat at least 3 REAL meals and 1-2 snacks per day.  Eat breakfast within one hour of getting up.  Aim for no more than 5 hours between eating.   A REAL meal includes at least some protein, some starch, and vegetables and/or fruit.   (OR: Would you serve this to a guest in your home, and call it a meal?)  Goals: 1. I will have 12 meals a week that contains a vegetable 2. I will eat at least 3 real meals a day and 1-2 healthy snacks per day. I will prepare meals for work.  3. I will exercise at least 15 minutes at least 3 times a week   I will follow up on 03/05/21 at 9:00am. I will call Dr. Gerilyn Pilgrim if this doesn't work.

## 2021-01-29 NOTE — Progress Notes (Signed)
Appt start time: 0900 end time: 0930 (30 minutes)  What do you want to get out of meeting with me today? Weight management  Goal would be 20lbs.   Relevant history/background: Overweight: 10lb weight gain from 2020 to 2021 (weight: August 2020 146lbs, March 2022 156). Stable Weight from 2021 to 2022.   Has tried weight watcher's with some success   Assessment:  On a normal day, how many meals do you have? About 2-3 How many snacks do you eat per day? 1-2 What beverages do you drink most days of the week? Coffee, water, diet sweat tea, diet soda, poweraid zero What foods do you eat most days of the week? Jimmy Dean breakfast, bananas, bread, oatmeal, cottage cheese, Special K breakfast bar/grandola bar, fruit, chicken Usual physical activity: walk the dog daily (5-58min), housework, job  Sleep: Patient estimates average of 9-10 hours of sleep/night.  24-hr recall:  (Up at 9 AM) B (9 AM)-   Coffee + diet powdered flavored creamer, apple juice (4oz)  Snk ( AM)-   L (12 PM)-  2 C Lasagna with meat sauce, 2/3 C green beans, rolls with oil, Coffee + 2 Tsp creamer, 1 tsb sugar + 1 Canolli  Snk ( PM)-   D (6 PM)-  4 breaded fish sticks + 1Tsp mayo/1Tsp ketchup + 1C steamable frozen vegetables, zero poweraid Snk (7 PM)-  Payday  Typical day? No.   Typical: Breakfast: Jimmy Dean biscuit (Egg, cheese, bacon) Lunch: Salad (chicken (1/2C, cheese (2 tsp), ceaser dressing (4TsP)) or Breakfast bar + banana  Dinner: rice, meat/fish, vegetable  Intervention: Completed diet and exercise history, and established: - SMART behavioral goals (Smart Goals: Specific, Measurable, Action-oriented, Realistic, Time-based.)  1. 12 meals a week with a vegetable  2. I will eat at least 3 real meals a day and 1-2 healthy snacks per day. I will prepare meals for work.  3. I will exercise at least 15 minutes at least 3 times a week  Goal to lose 5% of body weight  - How will you document progress on goals?  Monthly  calendar  For recommendations and goals, see Patient Instructions.    Follow-up: 03/05/21 at 9:00am Dr. [facility]

## 2021-02-06 ENCOUNTER — Telehealth: Payer: Self-pay | Admitting: Psychiatry

## 2021-02-06 NOTE — Telephone Encounter (Signed)
Pt informed

## 2021-02-06 NOTE — Telephone Encounter (Signed)
Please review

## 2021-02-06 NOTE — Telephone Encounter (Signed)
Ok to take Biotin

## 2021-02-06 NOTE — Telephone Encounter (Signed)
Tanya Simpson called because she is having thinning of her hair and wants to know if it is ok to Biotin 5000mg  l/day.  Please call to let her know if there would be any interaction with her other medications.

## 2021-02-25 ENCOUNTER — Telehealth (INDEPENDENT_AMBULATORY_CARE_PROVIDER_SITE_OTHER): Payer: Medicare Other | Admitting: Family Medicine

## 2021-02-25 ENCOUNTER — Other Ambulatory Visit: Payer: Self-pay

## 2021-02-25 DIAGNOSIS — U071 COVID-19: Secondary | ICD-10-CM

## 2021-02-25 MED ORDER — MOLNUPIRAVIR EUA 200MG CAPSULE: 4.0000 | ORAL_CAPSULE | Freq: Two times a day (BID) | ORAL | 0 refills | Status: AC

## 2021-02-25 NOTE — Progress Notes (Signed)
[facility] Telemedicine Visit  Patient consented to have virtual visit and was identified by name and date of birth. Method of visit: Telephone  Encounter participants: Patient: Tanya Simpson - located at home  Provider: Westley Chandler - located at home  Others (if applicable): husband- Tanya Simpson  Chief Complaint: COVID positive   HPI: Tanya Simpson is a pleasant 60 year old with history significant for schizophrenia on Seroquel presenting with positive COVID test and myalgias.  The patient reports her symptoms started yesterday.  Her husband was positive and her test returned positive yesterday which was May 24.  Patient endorses cough, chills night sweats and overall feeling which she describes as rundown.  She is eating and drinking well.  She denies chest pain, fevers, changes in her lower extremities.  She received 2 COVID vaccines but no boosters.  She has a history of schizophrenia on lithium, haloperidol and Seroquel.  She would like to consider an oral medication should 1 be an option for her.  The patient reports her last menses was 6 or 8 months ago.  She had gone between 1 and 1.5 years without a menses and then had what she describes as a 'regular period'.  This was 6 months ago.  She reports she does not want a child in the future and has not had a period since that time.  The patient currently works in Chief Executive Officer in [employer]  ROS: per HPI  Pertinent PMHx:  Schizophrenia, last hospital mission about a year ago  Exam:  Hoarse voice but speaking in full sentences no respiratory distress talkative and appropriate  Assessment/Plan:  COVID-19 Discussed symptomatic management, given lithium she should not use NSAIDs.  She can take Tylenol for supportive care encouraged her to push fluids.  She has normal kidney function she is not of childbearing potential and thus will prescribe molnupiravir  (chosen over paxlovid as Seroquel interacts with Paxlovid and  this patient has been stable on mood regimen and has had flares of psychiatric symptoms in past).   All questions answered, discussed quarantine guidelines. Discussed recommendations if symptoms (particularly breathing) were to change).  Earliest she may return to work is May 30.   Recommended receiving COVID booster at follow up with Dr. Dareen Piano.   Postmenopausal Bleeding  Recommended she schedule follow-up with Dr. Dareen Piano to discuss as this should be evaluated for potential endometrial pathology.    Time spent during visit with patient: 13 minutes

## 2021-02-25 NOTE — Assessment & Plan Note (Signed)
Discussed symptomatic management, given lithium she should not use NSAIDs.  She can take Tylenol for supportive care encouraged her to push fluids.  She has normal kidney function she is not of childbearing potential and thus will prescribe molnupiravir  (chosen over paxlovid as Seroquel interacts with Paxlovid and this patient has been stable on mood regimen and has had flares of psychiatric symptoms in past).   All questions answered, discussed quarantine guidelines. Discussed recommendations if symptoms (particularly breathing) were to change).  Earliest she may return to work is May 30.

## 2021-02-27 ENCOUNTER — Encounter: Payer: Self-pay | Admitting: Family Medicine

## 2021-02-27 ENCOUNTER — Telehealth: Payer: Self-pay

## 2021-02-27 NOTE — Telephone Encounter (Signed)
Patient calls nurse line stating she would like to speak with Dr. Manson Passey in regards to her Covid symptoms. Patient reports I only got to "speak with her briefly," and would to discuss some symptoms and return to work. Please advise.

## 2021-02-27 NOTE — Telephone Encounter (Signed)
Called patient to check in. Reports overall cough is worse. No dyspnea, chest pain, nausea, or vomiting. She is finishing medications. Will message if symptoms not improved on Monday 5/30.  Terisa Starr, MD  Family Medicine Teaching Service

## 2021-03-05 ENCOUNTER — Encounter: Payer: Self-pay | Admitting: Family Medicine

## 2021-03-05 ENCOUNTER — Ambulatory Visit: Payer: Medicare Other | Admitting: Family Medicine

## 2021-03-05 NOTE — Progress Notes (Signed)
Patient was seen for weight check, which reveals 5-lb loss since late-March.  Has been making effort, but not always  meeting behavioral goals established in [facility] 01/29/21.  Has not been documenting progress, but committed to same goals and to start  documentation with Goals Sheet provided today.

## 2021-03-13 ENCOUNTER — Telehealth: Payer: Self-pay | Admitting: Psychiatry

## 2021-03-13 DIAGNOSIS — F312 Bipolar disorder, current episode manic severe with psychotic features: Secondary | ICD-10-CM

## 2021-03-13 MED ORDER — QUETIAPINE FUMARATE 25 MG PO TABS: 50.0000 mg | ORAL_TABLET | Freq: Every day | ORAL | 0 refills | Status: DC

## 2021-03-13 NOTE — Telephone Encounter (Signed)
Returned call to patient.  She reports that she has been feeling "exhausted."  Reports sleeping 10 to 12 hours a night.  She reports that she has been getting hot and dizzy at her new job.  She reports that she had COVID 2 weeks ago.  She is requesting to decrease medication if possible to minimize fatigue and excessive somnolence.  Discussed decreasing Seroquel from 75 mg at bedtime to 50 mg at bedtime to possibly minimize excessive somnolence.  Patient agrees to contact office if she experiences any worsening insomnia, mood, or anxiety signs and symptoms.

## 2021-03-13 NOTE — Telephone Encounter (Signed)
Pt left message on vmail asking JC to return call @ [phone] or text [phone]. Stated need  to speak directly to [facility]. APT 7/13

## 2021-03-13 NOTE — Addendum Note (Signed)
Addended by: Derenda Mis on: 03/13/2021 03:07 PM   Modules accepted: Orders

## 2021-04-15 ENCOUNTER — Encounter: Payer: Self-pay | Admitting: Psychiatry

## 2021-04-15 ENCOUNTER — Other Ambulatory Visit: Payer: Self-pay

## 2021-04-15 ENCOUNTER — Ambulatory Visit (INDEPENDENT_AMBULATORY_CARE_PROVIDER_SITE_OTHER): Payer: Medicare Other | Admitting: Psychiatry

## 2021-04-15 DIAGNOSIS — G2401 Drug induced subacute dyskinesia: Secondary | ICD-10-CM | POA: Diagnosis not present

## 2021-04-15 DIAGNOSIS — F312 Bipolar disorder, current episode manic severe with psychotic features: Secondary | ICD-10-CM

## 2021-04-15 MED ORDER — BENZTROPINE MESYLATE 1 MG PO TABS: 1.0000 mg | ORAL_TABLET | Freq: Two times a day (BID) | ORAL | 0 refills | Status: DC

## 2021-04-15 MED ORDER — HALOPERIDOL 20 MG PO TABS: 20.0000 mg | ORAL_TABLET | Freq: Every day | ORAL | 0 refills | Status: DC

## 2021-04-15 MED ORDER — LITHIUM CARBONATE ER 300 MG PO TBCR: 300.0000 mg | EXTENDED_RELEASE_TABLET | Freq: Every morning | ORAL | 0 refills | Status: DC

## 2021-04-15 MED ORDER — QUETIAPINE FUMARATE 25 MG PO TABS: 50.0000 mg | ORAL_TABLET | Freq: Every day | ORAL | 0 refills | Status: DC

## 2021-04-15 NOTE — Progress Notes (Signed)
Tanya Simpson [id number] 01-Sep-1961 60 y.o.  Subjective:   Patient ID:  Tanya Simpson is a 60 y.o. (DOB [birth year]) female.  Chief Complaint:  Chief Complaint  Patient presents with   Follow-up    H/o mood disturbance and paranoia     HPI Tanya Simpson presents to the office today for follow-up of history or mood disturbance and paranoia. She reports that she has been feeling "tired," particularly in the morning. She reports that she has been falling asleep without difficulty. She reports some slight improvement in daytime somnolence. She reports that her energy is low. She reports that her mood is down at times when husband is watching baseball and she would rather spend time together.   She reports that she has a new boss. She reports that she has to take a 5 minute break. She reports that work has been going ok and work 4 hours shifts 3 day a week.  Has lost 4 lbs intentionally. Sleeping well. She reports that she occasionally feels nervous. Denies SI.   She reports that she and her husband had COVID about 6 weeks ago. Husband is turning 49 in September. Born in [place] and has lived in [place] and [place].   AIMS    Flowsheet Row Office Visit from 04/15/2021 in [facility] Office Visit from 11/05/2020 in [facility] Office Visit from 03/25/2020 in [facility] Office Visit from 01/17/2020 in [facility] Office Visit from 11/09/2019 in [facility]  AIMS Total Score 1 0 0 1 2      GAD-7    Flowsheet Row Office Visit from 04/20/2018 in [facility] Office Visit from 03/28/2018 in [facility] Office Visit from 03/14/2018 in [facility] Office Visit from 02/28/2018 in [facility] Office Visit from 10/11/2017 in [facility]  Total GAD-7 Score 3 3 1 3 3       PHQ2-9    Flowsheet Row Office Visit from 12/29/2020 in [facility] Office Visit from 07/04/2020 in [facility] Office Visit from 03/26/2020 in [facility] Office Visit from 05/30/2019 in [facility] Office Visit from 12/19/2018 in [facility]  PHQ-2 Total Score 0 1 1 0 0  PHQ-9 Total Score 2 5 -- -- --      Flowsheet Row ED to Hosp-Admission (Discharged) from 03/28/2020 in [facility]  C-SSRS RISK CATEGORY High Risk        Review of Systems:  Review of Systems  Constitutional:  Positive for fatigue.  Eyes:        Occ blurriness  Musculoskeletal:  Negative for gait problem.  Neurological:  Negative for tremors.  Psychiatric/Behavioral:         Please refer to HPI   Medications: I have reviewed the patient's current medications.  Current Outpatient Medications  Medication Sig Dispense Refill   BIOTIN PO Take by mouth.     zinc gluconate 50 MG tablet Take 50 mg by mouth daily.     benztropine (COGENTIN) 1 MG tablet Take 1 tablet (1 mg total) by mouth 2 (two) times daily. 180 tablet 0   haloperidol (HALDOL) 20 MG tablet Take 1 tablet (20 mg total) by mouth at bedtime. 90 tablet 0   lithium carbonate (LITHOBID) 300 MG CR tablet Take 1 tablet (300 mg total)  by mouth every morning. 90 tablet 0   Prenatal Vit-Fe Fumarate-FA (PRENATAL VITAMINS) 28-0.8 MG TABS Take 1 tablet by mouth daily. 90 tablet 1   QUEtiapine (SEROQUEL) 25 MG tablet Take 2 tablets (50 mg total) by mouth at bedtime. 180 tablet 0   No current facility-administered medications for this visit.    Medication Side Effects: Fatigue and Other: eyes watering  Allergies: No Known Allergies  Past Medical History:  Diagnosis Date   Allergic rhinitis    Anxiety    Depression    Schizophrenia (HCC)     Past Medical History, Surgical history, Social history, and Family history were reviewed and updated as appropriate.   Please see review  of systems for further details on the patient's review from today.   Objective:   Physical Exam:  Wt 149 lb (67.6 kg)   BMI 26.39 kg/m   Physical Exam Constitutional:      General: She is not in acute distress. Musculoskeletal:        General: No deformity.  Neurological:     Mental Status: She is alert and oriented to person, place, and time.     Coordination: Coordination normal.  Psychiatric:        Attention and Perception: Attention and perception normal. She does not perceive auditory or visual hallucinations.        Mood and Affect: Mood normal. Mood is not anxious or depressed. Affect is not labile, blunt, angry or inappropriate.        Speech: Speech normal.        Behavior: Behavior normal.        Thought Content: Thought content is not delusional. Thought content does not include homicidal or suicidal ideation. Thought content does not include homicidal or suicidal plan.        Cognition and Memory: Cognition and memory normal.        Judgment: Judgment normal.     Comments: Insight fair Mild paranoia    Lab Review:     Component Value Date/Time   NA 135 12/29/2020 1458   K 4.2 12/29/2020 1458   CL 99 12/29/2020 1458   CO2 22 12/29/2020 1458   GLUCOSE 85 12/29/2020 1458   GLUCOSE 105 (H) 04/01/2020 0348   BUN 15 12/29/2020 1458   CREATININE 0.78 12/29/2020 1458   CALCIUM 9.2 12/29/2020 1458   PROT 6.7 12/29/2020 1458   ALBUMIN 4.6 12/29/2020 1458   AST 18 12/29/2020 1458   ALT 15 12/29/2020 1458   ALKPHOS 65 12/29/2020 1458   BILITOT 0.2 12/29/2020 1458   GFRNONAA >60 04/01/2020 0348   GFRAA >60 04/01/2020 0348       Component Value Date/Time   WBC 9.5 12/29/2020 1458   WBC 10.7 (H) 04/01/2020 0348   RBC 4.21 12/29/2020 1458   RBC 4.01 04/01/2020 0348   HGB 13.3 12/29/2020 1458   HCT 39.1 12/29/2020 1458   PLT 331 04/01/2020 0348   PLT 399 03/26/2020 1226   MCV 93 12/29/2020 1458   MCH 31.6 12/29/2020 1458   MCH 31.2 04/01/2020 0348   MCHC  34.0 12/29/2020 1458   MCHC 33.4 04/01/2020 0348   RDW 11.9 12/29/2020 1458   LYMPHSABS 2.4 12/29/2020 1458   MONOABS 0.8 03/28/2020 1646   EOSABS 0.3 12/29/2020 1458   BASOSABS 0.1 12/29/2020 1458    Lithium Lvl  Date Value Ref Range Status  12/29/2020 0.5 0.5 - 1.2 mmol/L Final    Comment:    Plasma concentration  of 0.5 - 0.8 mmol/L are advised for long-term use; concentrations of up to 1.2 mmol/L may be necessary during acute treatment.                                  Detection Limit = 0.1                           <0.1 indicates None Detected      No results found for: PHENYTOIN, PHENOBARB, VALPROATE, CBMZ   .res Assessment: Plan:   Pt seen for 30 minutes and time spent counseling pt regarding her questions about whether to continue current medications or make any dose reductions.  Discussed that medications have seemed to be helpful for stabilizing signs and symptoms and she is in agreement with this.  She agrees to continuing current medications without changes.  Discussed strategies to possibly minimize excessive somnolence upon awakening to include taking bedtime medications earlier in the evening and allowing herself additional time to fully awaken in the morning before going to work. Continue Haldol 20 mg at bedtime for mood and psychosis. Continue Seroquel 50 mg at bedtime for insomnia and mood stabilization. Continue Cogentin 1 mg twice daily for tardive dyskinesia. Continue lithium 300 mg in the morning for mood s/s.  Patient to follow-up in 2 to 3 months or sooner if clinically indicated. Patient advised to contact office with any questions, adverse effects, or acute worsening in signs and symptoms.  Allana was seen today for follow-up.  Diagnoses and all orders for this visit:  Bipolar affective disorder, current episode manic with psychotic symptoms (HCC) -     haloperidol (HALDOL) 20 MG tablet; Take 1 tablet (20 mg total) by mouth at bedtime. -     lithium  carbonate (LITHOBID) 300 MG CR tablet; Take 1 tablet (300 mg total) by mouth every morning. -     QUEtiapine (SEROQUEL) 25 MG tablet; Take 2 tablets (50 mg total) by mouth at bedtime.  Tardive dyskinesia -     benztropine (COGENTIN) 1 MG tablet; Take 1 tablet (1 mg total) by mouth 2 (two) times daily.    Please see After Visit Summary for patient specific instructions.  Future Appointments  Date Time Provider Department Center  06/24/2021 11:30 AM Corie Chiquito, PMHNP [facility] None    No orders of the defined types were placed in this encounter.   -------------------------------

## 2021-04-15 NOTE — Progress Notes (Signed)
   04/15/21 1152  Facial and Oral Movements  Muscles of Facial Expression 0  Lips and Perioral Area 0  Jaw 0  Tongue 0  Extremity Movements  Upper (arms, wrists, hands, fingers) 0  Lower (legs, knees, ankles, toes) 0  Trunk Movements  Neck, shoulders, hips 0  Overall Severity  Severity of abnormal movements (highest score from questions above) 0  Incapacitation due to abnormal movements 0  Patient's awareness of abnormal movements (rate only patient's report) 1  AIMS Total Score  AIMS Total Score 1

## 2021-05-11 ENCOUNTER — Other Ambulatory Visit: Payer: Self-pay | Admitting: Psychiatry

## 2021-05-11 DIAGNOSIS — F312 Bipolar disorder, current episode manic severe with psychotic features: Secondary | ICD-10-CM

## 2021-06-20 ENCOUNTER — Emergency Department (HOSPITAL_BASED_OUTPATIENT_CLINIC_OR_DEPARTMENT_OTHER): Payer: No Typology Code available for payment source | Admitting: Radiology

## 2021-06-20 ENCOUNTER — Other Ambulatory Visit: Payer: Self-pay

## 2021-06-20 ENCOUNTER — Encounter (HOSPITAL_BASED_OUTPATIENT_CLINIC_OR_DEPARTMENT_OTHER): Payer: Self-pay

## 2021-06-20 ENCOUNTER — Emergency Department (HOSPITAL_BASED_OUTPATIENT_CLINIC_OR_DEPARTMENT_OTHER)
Admission: EM | Admit: 2021-06-20 | Discharge: 2021-06-20 | Disposition: A | Discharge status: Home / Self Care | Payer: No Typology Code available for payment source | Attending: Emergency Medicine | Admitting: Emergency Medicine

## 2021-06-20 DIAGNOSIS — R0781 Pleurodynia: Secondary | ICD-10-CM | POA: Diagnosis not present

## 2021-06-20 DIAGNOSIS — M549 Dorsalgia, unspecified: Secondary | ICD-10-CM | POA: Insufficient documentation

## 2021-06-20 DIAGNOSIS — R0789 Other chest pain: Secondary | ICD-10-CM | POA: Diagnosis not present

## 2021-06-20 DIAGNOSIS — Z87891 Personal history of nicotine dependence: Secondary | ICD-10-CM | POA: Insufficient documentation

## 2021-06-20 DIAGNOSIS — Z8616 Personal history of COVID-19: Secondary | ICD-10-CM | POA: Insufficient documentation

## 2021-06-20 DIAGNOSIS — Y990 Civilian activity done for income or pay: Secondary | ICD-10-CM | POA: Insufficient documentation

## 2021-06-20 DIAGNOSIS — W01198A Fall on same level from slipping, tripping and stumbling with subsequent striking against other object, initial encounter: Secondary | ICD-10-CM | POA: Diagnosis not present

## 2021-06-20 MED ORDER — OXYCODONE-ACETAMINOPHEN 5-325 MG PO TABS
1.0000 | ORAL_TABLET | Freq: Once | ORAL | Status: AC
  Administered 2021-06-20: 1 via ORAL
  Filled 2021-06-20: qty 1

## 2021-06-20 MED ORDER — METHOCARBAMOL 500 MG PO TABS: 500.0000 mg | ORAL_TABLET | Freq: Three times a day (TID) | ORAL | 0 refills | Status: DC | PRN

## 2021-06-20 MED ORDER — METHOCARBAMOL 500 MG PO TABS
500.0000 mg | ORAL_TABLET | Freq: Once | ORAL | Status: AC
  Administered 2021-06-20: 500 mg via ORAL
  Filled 2021-06-20: qty 1

## 2021-06-20 MED ORDER — OXYCODONE-ACETAMINOPHEN 5-325 MG PO TABS
1.0000 | ORAL_TABLET | Freq: Once | ORAL | Status: DC
  Filled 2021-06-20: qty 1

## 2021-06-20 NOTE — Discharge Instructions (Signed)
Recommend Tylenol, Motrin as needed for pain control.  Can also take the prescribed muscle relaxer.  Note this can make you mildly drowsy and should not be taken while driving or operating heavy machinery.  If you develop worsening chest pain, difficulty breathing or other new concerning symptom, come back to ER for reassessment.

## 2021-06-20 NOTE — ED Provider Notes (Signed)
[facility] EMERGENCY DEPT Provider Note   CSN: [id number] Arrival date & time: 06/20/21  1113     History Chief Complaint  Patient presents with   Tanya Simpson is a 60 y.o. female.  Presents to ER with concern for fall.  Patient reports that yesterday afternoon she fell backwards after losing her balance and had some sort of metal pole.  Landed on her right back.  Relatively constant pain since that time.  Otherwise no other injuries.  Denies head trauma.  Tried taking Tylenol with minimal relief.  Pain is worse with certain movements, no alleviating factors.  No difficulty in breathing.  No abdominal pain.  HPI     Past Medical History:  Diagnosis Date   Allergic rhinitis    Anxiety    Depression    Schizophrenia [facility])     Patient Active Problem List   Diagnosis Date Noted   COVID-19 02/25/2021   Weight gain 12/31/2020   Dyspareunia in female 12/31/2020   GERD (gastroesophageal reflux disease) 12/31/2020   Takes daily multivitamins 07/07/2020   Breast tenderness 07/07/2020   Bipolar affective disorder, current episode manic with psychotic symptoms (HCC) 04/02/2020   Acute psychosis (HCC) 04/01/2020   Bipolar affective disorder, depressed, severe (HCC) 03/30/2020   Altered mental status 03/28/2020   Overdose, undetermined intent, initial encounter 03/28/2020    Past Surgical History:  Procedure Laterality Date   TONSILLECTOMY       OB History   No obstetric history on file.     Family History  Problem Relation Age of Onset   Hypertension Mother    Impulse control disorder Mother    CVA Father    Depression Father     Social History   Tobacco Use   Smoking status: Former    Packs/day: 1.00    Years: 1.00    Pack years: 1.00    Types: Cigarettes   Smokeless tobacco: Never  Vaping Use   Vaping Use: Never used  Substance Use Topics   Alcohol use: Yes    Alcohol/week: 1.0 standard drink    Types: 1 Glasses of wine per week     Comment: very little   Drug use: No    Home Medications Prior to Admission medications   Medication Sig Start Date End Date Taking? Authorizing Provider  methocarbamol (ROBAXIN) 500 MG tablet Take 1 tablet (500 mg total) by mouth every 8 (eight) hours as needed for muscle spasms. 06/20/21  Yes Milagros Loll, MD  benztropine (COGENTIN) 1 MG tablet Take 1 tablet (1 mg total) by mouth 2 (two) times daily. 04/15/21 07/14/21  Corie Chiquito, PMHNP  BIOTIN PO Take by mouth.    [provider]  haloperidol (HALDOL) 20 MG tablet Take 1 tablet (20 mg total) by mouth at bedtime. 04/15/21 07/14/21  Corie Chiquito, PMHNP  lithium carbonate (LITHOBID) 300 MG CR tablet Take 1 tablet (300 mg total) by mouth every morning. 04/15/21 07/14/21  Corie Chiquito, PMHNP  Prenatal Vit-Fe Fumarate-FA (PRENATAL VITAMINS) 28-0.8 MG TABS Take 1 tablet by mouth daily. 07/04/20   Leeroy Bock, MD  QUEtiapine (SEROQUEL) 25 MG tablet TAKE 3 TABLETS BY MOUTH AT BEDTIME 05/12/21   Corie Chiquito, PMHNP  zinc gluconate 50 MG tablet Take 50 mg by mouth daily.    [provider]    Allergies    Patient has no known allergies.  Review of Systems   Review of Systems  Constitutional:  Negative for  chills and fever.  HENT:  Negative for ear pain and sore throat.   Eyes:  Negative for pain and visual disturbance.  Respiratory:  Negative for cough and shortness of breath.   Cardiovascular:  Negative for chest pain and palpitations.  Gastrointestinal:  Negative for abdominal pain and vomiting.  Genitourinary:  Negative for dysuria and hematuria.  Musculoskeletal:  Positive for back pain. Negative for arthralgias.  Skin:  Negative for color change and rash.  Neurological:  Negative for seizures and syncope.  All other systems reviewed and are negative.  Physical Exam Updated Vital Signs BP 119/60 (BP Location: Right Arm)   Pulse 69   Temp 98.6 F (37 C) (Oral)   Resp 18   Ht 5\' 3"  (1.6 m)    Wt 68 kg   SpO2 99%   BMI 26.57 kg/m   Physical Exam Vitals and nursing note reviewed.  Constitutional:      General: She is not in acute distress.    Appearance: She is well-developed.  HENT:     Head: Normocephalic and atraumatic.  Eyes:     Conjunctiva/sclera: Conjunctivae normal.  Cardiovascular:     Rate and Rhythm: Normal rate and regular rhythm.     Heart sounds: No murmur heard. Pulmonary:     Effort: Pulmonary effort is normal. No respiratory distress.     Breath sounds: Normal breath sounds.  Chest:     Comments: Some tenderness over the right posterior chest wall, no crepitus Abdominal:     Palpations: Abdomen is soft.     Tenderness: There is no abdominal tenderness.  Musculoskeletal:     Cervical back: Neck supple.  Skin:    General: Skin is warm and dry.  Neurological:     Mental Status: She is alert.    ED Results / Procedures / Treatments   Labs (all labs ordered are listed, but only abnormal results are displayed) Labs Reviewed - No data to display  EKG None  Radiology DG Ribs Unilateral W/Chest Right  Result Date: 06/20/2021 CLINICAL DATA:  RIGHT rib pain EXAM: RIGHT RIBS AND CHEST - 3+ VIEW COMPARISON:  03/28/2020 FINDINGS: Normal mediastinum and cardiac silhouette. Normal pulmonary vasculature. No evidence of effusion, infiltrate, or pneumothorax. No acute bony abnormality. . Dedicated views of the ribs demonstrate no displaced fracture IMPRESSION: 1. No thoracic trauma. 2. No evidence of rib fracture.  No pneumothorax. Electronically Signed   By: 03/30/2020 M.D.   On: 06/20/2021 13:21    Procedures Procedures   Medications Ordered in ED Medications  methocarbamol (ROBAXIN) tablet 500 mg (500 mg Oral Given 06/20/21 1245)  oxyCODONE-acetaminophen (PERCOCET/ROXICET) 5-325 MG per tablet 1 tablet (1 tablet Oral Given 06/20/21 1244)    ED Course  I have reviewed the triage vital signs and the nursing notes.  Pertinent labs & imaging  results that were available during my care of the patient were reviewed by me and considered in my medical decision making (see chart for details).    MDM Rules/Calculators/A&P                           60 year old lady presents to ER with concern for back pain after fall.  On exam she has no midline T or L-spine tenderness but does have some tenderness over the right posterior chest wall.  Plain films negative for acute pathology.  Recommend supportive care, suspect MSK strain.  Discharged home.   After the discussed  management above, the patient was determined to be safe for discharge.  The patient was in agreement with this plan and all questions regarding their care were answered.  ED return precautions were discussed and the patient will return to the ED with any significant worsening of condition.  Final Clinical Impression(s) / ED Diagnoses Final diagnoses:  Chest wall pain    Rx / DC Orders ED Discharge Orders          Ordered    methocarbamol (ROBAXIN) 500 MG tablet  Every 8 hours PRN        06/20/21 1410             Milagros Loll, MD 06/21/21 [phone]

## 2021-06-20 NOTE — ED Triage Notes (Signed)
Pt works at [employer] and was bending down when she lost her balance and fell backwards and hit her head on a "metal pole". Pt states her pain started mid back on the right side and is now more into the right flank. Pt reports it feel better after using a heating pad. Last dose of Tylenol this morning.

## 2021-06-24 ENCOUNTER — Ambulatory Visit: Payer: Medicare Other | Admitting: Psychiatry

## 2021-07-01 ENCOUNTER — Other Ambulatory Visit: Payer: Self-pay

## 2021-07-01 ENCOUNTER — Ambulatory Visit (INDEPENDENT_AMBULATORY_CARE_PROVIDER_SITE_OTHER): Payer: Medicare Other | Admitting: Psychiatry

## 2021-07-01 ENCOUNTER — Encounter: Payer: Self-pay | Admitting: Psychiatry

## 2021-07-01 DIAGNOSIS — G2401 Drug induced subacute dyskinesia: Secondary | ICD-10-CM

## 2021-07-01 DIAGNOSIS — F319 Bipolar disorder, unspecified: Secondary | ICD-10-CM | POA: Diagnosis not present

## 2021-07-01 DIAGNOSIS — Z79899 Other long term (current) drug therapy: Secondary | ICD-10-CM | POA: Diagnosis not present

## 2021-07-01 DIAGNOSIS — F312 Bipolar disorder, current episode manic severe with psychotic features: Secondary | ICD-10-CM

## 2021-07-01 MED ORDER — BENZTROPINE MESYLATE 1 MG PO TABS: 1.0000 mg | ORAL_TABLET | Freq: Two times a day (BID) | ORAL | 0 refills | Status: DC

## 2021-07-01 MED ORDER — HALOPERIDOL 20 MG PO TABS: 20.0000 mg | ORAL_TABLET | Freq: Every day | ORAL | 0 refills | Status: DC

## 2021-07-01 MED ORDER — LITHIUM CARBONATE ER 300 MG PO TBCR: 300.0000 mg | EXTENDED_RELEASE_TABLET | Freq: Every morning | ORAL | 0 refills | Status: DC

## 2021-07-01 MED ORDER — QUETIAPINE FUMARATE 25 MG PO TABS: ORAL_TABLET | ORAL | 0 refills | Status: DC

## 2021-07-01 NOTE — Progress Notes (Signed)
Tanya Simpson [id number] November 21, 1960 60 y.o.  Subjective:   Patient ID:  Tanya Simpson is a 60 y.o. (DOB 02/29/1961) female.  Chief Complaint:  Chief Complaint  Patient presents with   Follow-up    H/o mood disturbance, anxiety    HPI Tanya Simpson presents to the office today for follow-up of mood disturbance and anxiety. She reports that she would feel hot and dizzy periodically at work and was needing a 10 minute break after 2 hours.   Sleeping ok. Some anxiety. Mood has been ok. Energy is fair. Appetite ok. Denies SI.  AIMS    Flowsheet Row Office Visit from 04/15/2021 in [facility] Office Visit from 11/05/2020 in [facility] Office Visit from 03/25/2020 in [facility] Office Visit from 01/17/2020 in [facility] Office Visit from 11/09/2019 in [facility]  AIMS Total Score 1 0 0 1 2      GAD-7    Flowsheet Row Office Visit from 04/20/2018 in [facility] Office Visit from 03/28/2018 in [facility] Office Visit from 03/14/2018 in [facility] Office Visit from 02/28/2018 in [facility] Office Visit from 10/11/2017 in [facility]  Total GAD-7 Score 3 3 1 3 3       PHQ2-9    Flowsheet Row Office Visit from 12/29/2020 in [facility] Office Visit from 07/04/2020 in [facility] Office Visit from 03/26/2020 in [facility] Office Visit from 05/30/2019 in [facility] Office Visit from 12/19/2018 in [facility]  PHQ-2 Total Score 0 1 1 0 0  PHQ-9 Total Score 2 5 -- -- --      Flowsheet Row ED from 06/20/2021 in [facility] ED to Hosp-Admission (Discharged) from 03/28/2020 in [facility]  C-SSRS RISK CATEGORY No Risk High Risk         Review of Systems:  Review of Systems  Musculoskeletal:  Positive for back pain. Negative for gait problem.       Pain on right side since fall  Neurological:  Negative for tremors.  Psychiatric/Behavioral:         Please refer to HPI   Medications: I have reviewed the patient's current medications.  Current Outpatient Medications  Medication Sig Dispense Refill   acetaminophen (TYLENOL) 325 MG tablet Take 650 mg by mouth every 6 (six) hours as needed.     BIOTIN PO Take by mouth.     methocarbamol (ROBAXIN) 500 MG tablet Take 1 tablet (500 mg total) by mouth every 8 (eight) hours as needed for muscle spasms. 20 tablet 0   Prenatal Vit-Fe Fumarate-FA (PRENATAL VITAMINS) 28-0.8 MG TABS Take 1 tablet by mouth daily. 90 tablet 1   zinc gluconate 50 MG tablet Take 50 mg by mouth daily.     benztropine (COGENTIN) 1 MG tablet Take 1 tablet (1 mg total) by mouth 2 (two) times daily. 180 tablet 0   haloperidol (HALDOL) 20 MG tablet Take 1 tablet (20 mg total) by mouth at bedtime. 90 tablet 0   lithium carbonate (LITHOBID) 300 MG CR tablet Take 1 tablet (300 mg total) by mouth every morning. 90 tablet 0   QUEtiapine (SEROQUEL) 25 MG tablet Take 2-3 tabs po QHS 270 tablet 0   No current facility-administered medications for this visit.    Medication  Side Effects: None  Allergies: No Known Allergies  Past Medical History:  Diagnosis Date   Allergic rhinitis    Anxiety    Depression    Schizophrenia (HCC)     Past Medical History, Surgical history, Social history, and Family history were reviewed and updated as appropriate.   Please see review of systems for further details on the patient's review from today.   Objective:   Physical Exam:  There were no vitals taken for this visit.  Physical Exam Constitutional:      General: She is not in acute distress. Musculoskeletal:        General: No deformity.  Neurological:     Mental Status: She is alert and oriented to person,  place, and time.     Coordination: Coordination normal.  Psychiatric:        Attention and Perception: Attention and perception normal. She does not perceive auditory or visual hallucinations.        Mood and Affect: Mood is anxious. Mood is not depressed. Affect is not labile, blunt, angry or inappropriate.        Speech: Speech is rapid and pressured.        Behavior: Behavior normal.        Thought Content: Thought content normal. Thought content is not paranoid or delusional. Thought content does not include homicidal or suicidal ideation. Thought content does not include homicidal or suicidal plan.        Cognition and Memory: Cognition and memory normal.        Judgment: Judgment normal.     Comments: Insight fair    Lab Review:     Component Value Date/Time   NA 135 12/29/2020 1458   K 4.2 12/29/2020 1458   CL 99 12/29/2020 1458   CO2 22 12/29/2020 1458   GLUCOSE 85 12/29/2020 1458   GLUCOSE 105 (H) 04/01/2020 0348   BUN 15 12/29/2020 1458   CREATININE 0.78 12/29/2020 1458   CALCIUM 9.2 12/29/2020 1458   PROT 6.7 12/29/2020 1458   ALBUMIN 4.6 12/29/2020 1458   AST 18 12/29/2020 1458   ALT 15 12/29/2020 1458   ALKPHOS 65 12/29/2020 1458   BILITOT 0.2 12/29/2020 1458   GFRNONAA >60 04/01/2020 0348   GFRAA >60 04/01/2020 0348       Component Value Date/Time   WBC 9.5 12/29/2020 1458   WBC 10.7 (H) 04/01/2020 0348   RBC 4.21 12/29/2020 1458   RBC 4.01 04/01/2020 0348   HGB 13.3 12/29/2020 1458   HCT 39.1 12/29/2020 1458   PLT 331 04/01/2020 0348   PLT 399 03/26/2020 1226   MCV 93 12/29/2020 1458   MCH 31.6 12/29/2020 1458   MCH 31.2 04/01/2020 0348   MCHC 34.0 12/29/2020 1458   MCHC 33.4 04/01/2020 0348   RDW 11.9 12/29/2020 1458   LYMPHSABS 2.4 12/29/2020 1458   MONOABS 0.8 03/28/2020 1646   EOSABS 0.3 12/29/2020 1458   BASOSABS 0.1 12/29/2020 1458    Lithium Lvl  Date Value Ref Range Status  12/29/2020 0.5 0.5 - 1.2 mmol/L Final    Comment:    Plasma  concentration of 0.5 - 0.8 mmol/L are advised for long-term use; concentrations of up to 1.2 mmol/L may be necessary during acute treatment.                                  Detection Limit = 0.1                           <  0.1 indicates None Detected      No results found for: PHENYTOIN, PHENOBARB, VALPROATE, CBMZ   .res Assessment: Plan:   She reports that she would like to continue current medications without changes. She reports some concerns about her kidneys with Lithium, particularly since she fell recently and reports some flank/back pain. Discussed labs are due to be re-checked and will include BMP and Lithium level.  Continue Haldol 20 mg po QHS for mood s/s.  Continue Lithium CR 300 mg po QHS for mood s/s,  Continue Seroquel 25 mg 2-3 tabs po QHS for mood s/s and insomnia.  Pt to follow-up in 2 months or sooner if clinically indicated.  Patient advised to contact office with any questions, adverse effects, or acute worsening in signs and symptoms.   Angla was seen today for follow-up.  Diagnoses and all orders for this visit:  Bipolar affective disorder, remission status unspecified (HCC) -     haloperidol (HALDOL) 20 MG tablet; Take 1 tablet (20 mg total) by mouth at bedtime. -     lithium carbonate (LITHOBID) 300 MG CR tablet; Take 1 tablet (300 mg total) by mouth every morning. -     QUEtiapine (SEROQUEL) 25 MG tablet; Take 2-3 tabs po QHS  High risk medication use -     Lithium level -     Basic metabolic panel  Tardive dyskinesia -     benztropine (COGENTIN) 1 MG tablet; Take 1 tablet (1 mg total) by mouth 2 (two) times daily.    Please see After Visit Summary for patient specific instructions.  Future Appointments  Date Time Provider Department Center  07/09/2021  2:10 PM ACCESS TO CARE POOL [facility]  09/02/2021 11:00 AM Corie Chiquito, PMHNP [facility] None    Orders Placed This Encounter  Procedures   Lithium level   Basic metabolic panel     -------------------------------

## 2021-07-01 NOTE — Progress Notes (Deleted)
     SUBJECTIVE:   CHIEF COMPLAINT / HPI:   Tanya Simpson is a 60 y.o. female presents for falls  Falls    ***  Flowsheet Row Office Visit from 12/29/2020 in [facility]  PHQ-9 Total Score 2        Health Maintenance Due  Topic   COVID-19 Vaccine (1)   TETANUS/TDAP    Zoster Vaccines- Shingrix (1 of 2)   COLONOSCOPY (Pts 45-37yrs Insurance coverage will need to be confirmed)    MAMMOGRAM    INFLUENZA VACCINE       PERTINENT  PMH / PSH:   OBJECTIVE:   There were no vitals taken for this visit.   General: Alert, no acute distress Cardio: Normal S1 and S2, RRR, no r/m/g Pulm: CTAB, normal work of breathing Abdomen: Bowel sounds normal. Abdomen soft and non-tender.  Extremities: No peripheral edema.  Neuro: Cranial nerves grossly intact   ASSESSMENT/PLAN:   No problem-specific Assessment & Plan notes found for this encounter.    Towanda Octave, MD PGY-3 [facility]

## 2021-07-02 ENCOUNTER — Ambulatory Visit: Payer: Medicare Other | Admitting: Family Medicine

## 2021-07-02 ENCOUNTER — Ambulatory Visit: Payer: Self-pay | Admitting: *Deleted

## 2021-07-02 NOTE — Telephone Encounter (Signed)
Reason for Disposition  [1] After 1 week (7 days) AND [2] still painful or swollen  Answer Assessment - Initial Assessment Questions 1. MECHANISM: "How did the injury happen?" (Consider the possibility of domestic violence or elder abuse)     Patient had fall at work- she injured her back.  2. ONSET: "When did the injury happen?" (Minutes or hours ago)     9/16 3. LOCATION: "What part of the back is injured?"     R lower back pain that radiates to the side 4. SEVERITY: "Can you move the back normally?"     yes 5. PAIN: "Is there any pain?" If Yes, ask: "How bad is the pain?"   (Scale 1-10; or mild, moderate, severe)     Using heating pad-rest- pain after on feet or laying down on side 6. CORD SYMPTOMS: Any weakness or numbness of the arms or legs?"     no 7. SIZE: For cuts, bruises, or swelling, ask: "How large is it?" (e.g., inches or centimeters)     N/a 8. TETANUS: For any breaks in the skin, ask: "When was the last tetanus booster?"     N/a 9. OTHER SYMPTOMS: "Do you have any other symptoms?" (e.g., abdominal pain, blood in urine)     Side pain 10. PREGNANCY: "Is there any chance you are pregnant?" "When was your last menstrual period?"       N/a  Protocols used: Back Injury-A-AH

## 2021-07-02 NOTE — Telephone Encounter (Signed)
Patient state she fell about 2 weeks ago and injured her back. Patient state she has had improvement- but if she stands for long period of time she has pain- or if she tries to lay on her side -she has pain. Patient advised she needs to contact her HR to let them know she is still having symptoms. They should be able to advised her on next steps- but certainly will probably need reevaluation for continuing pain.

## 2021-07-04 DIAGNOSIS — R0781 Pleurodynia: Secondary | ICD-10-CM | POA: Diagnosis not present

## 2021-07-07 ENCOUNTER — Telehealth: Payer: Self-pay | Admitting: Psychiatry

## 2021-07-07 NOTE — Telephone Encounter (Signed)
Pt LM on VM asking if it's ok to take Advil with current meds. She fell and hurt her back. Call back [phone]

## 2021-07-07 NOTE — Telephone Encounter (Signed)
Pt called asking if safe to take advil with current meds. She fell and hurt her back. Contact# [phone]

## 2021-07-07 NOTE — Telephone Encounter (Signed)
Pt informed

## 2021-07-07 NOTE — Telephone Encounter (Signed)
Advil should be fine right?

## 2021-07-07 NOTE — Telephone Encounter (Signed)
Please let her know that Advil and Lithium are both cleared through the kidneys and taking Advil multiple times a day for multiple consecutive days can raise Lithium level. Typically, short-term occasional use is not an issue. She may want to try alternating with Tylenol or using Tylenol more consistently and the Advil when Tylenol is not adequately controlling the pain.

## 2021-07-09 ENCOUNTER — Ambulatory Visit: Payer: Medicare Other

## 2021-08-05 ENCOUNTER — Ambulatory Visit (INDEPENDENT_AMBULATORY_CARE_PROVIDER_SITE_OTHER): Payer: Medicare Other | Admitting: Family Medicine

## 2021-08-05 ENCOUNTER — Encounter: Payer: Self-pay | Admitting: Family Medicine

## 2021-08-05 ENCOUNTER — Other Ambulatory Visit: Payer: Self-pay

## 2021-08-05 VITALS — BP 125/80 | HR 87 | Ht 64.0 in | Wt 153.6 lb

## 2021-08-05 DIAGNOSIS — Z5181 Encounter for therapeutic drug level monitoring: Secondary | ICD-10-CM

## 2021-08-05 NOTE — Progress Notes (Signed)
    SUBJECTIVE:   CHIEF COMPLAINT / HPI:   Tanya Simpson is a 60 yo F who presents to have her lithium levels checked. She takes lithium 300 mg daily. She does not have any concerns today. She denies nausea, vomiting, vision changes.   Needs flu vaccine- declined. Does not want to get 2 shots today.  Needs COVID booster- initially wanted to get it but changed her mind, wants to get it later. Does not want to deal with side effects this week. Needs colonosocpy- decline Needs mammogram- plans to get this done soon.    PERTINENT  PMH / PSH: Bipolar disorder  OBJECTIVE:   BP 125/80   Pulse 87   Ht 5\' 4"  (1.626 m)   Wt 153 lb 9.6 oz (69.7 kg)   SpO2 99%   BMI 26.37 kg/m   General: Appears well, no acute distress. Age appropriate. Cardiac: RRR, normal heart sounds, no murmurs Respiratory: CTAB, normal effort Extremities: No edema or cyanosis. Neuro: alert and oriented Psych: talking fast, constant shifting in seat appears anxious  ASSESSMENT/PLAN:   Medication monitoring encounter No concerns today. I am PCP but this is my first time meeting the patient. Reviewed most recent CBC, lipid panel and CMET which was unremarkable. Desires to have her lithium checked. No other labs needed at this time. She plans to get her mammogram soon. Declined colonoscopy, flu vaccine, and COVID vaccine. Last [facility] appt 07/01/21. Prescriber 07/03/21. Plan to reach out to her if levels are toxic or subtherapeutic.   - Lithium level  Corie Chiquito, DO [facility]

## 2021-08-05 NOTE — Patient Instructions (Addendum)
We discussed getting a lithium level. When this is available I will communicate with you via mychart.  Dr. Salvadore Dom

## 2021-08-06 LAB — LITHIUM LEVEL: Lithium Lvl: 0.3 mmol/L — ABNORMAL LOW (ref 0.5–1.2)

## 2021-08-11 ENCOUNTER — Telehealth: Payer: Self-pay | Admitting: Psychiatry

## 2021-08-11 NOTE — Telephone Encounter (Signed)
Tanya Simpson called and said that her lithium level she had done is fine.

## 2021-08-11 NOTE — Telephone Encounter (Signed)
Are you able to see the results,or do I need to try to get them for you?

## 2021-09-02 ENCOUNTER — Other Ambulatory Visit: Payer: Self-pay

## 2021-09-02 ENCOUNTER — Ambulatory Visit (INDEPENDENT_AMBULATORY_CARE_PROVIDER_SITE_OTHER): Payer: Medicare Other | Admitting: Psychiatry

## 2021-09-02 ENCOUNTER — Encounter: Payer: Self-pay | Admitting: Psychiatry

## 2021-09-02 DIAGNOSIS — F319 Bipolar disorder, unspecified: Secondary | ICD-10-CM | POA: Diagnosis not present

## 2021-09-02 DIAGNOSIS — G2401 Drug induced subacute dyskinesia: Secondary | ICD-10-CM | POA: Diagnosis not present

## 2021-09-02 MED ORDER — LITHIUM CARBONATE ER 300 MG PO TBCR: 300.0000 mg | EXTENDED_RELEASE_TABLET | Freq: Every morning | ORAL | 0 refills | Status: DC

## 2021-09-02 MED ORDER — HALOPERIDOL 20 MG PO TABS: 20.0000 mg | ORAL_TABLET | Freq: Every day | ORAL | 0 refills | Status: DC

## 2021-09-02 MED ORDER — BENZTROPINE MESYLATE 1 MG PO TABS: 1.0000 mg | ORAL_TABLET | Freq: Two times a day (BID) | ORAL | 0 refills | Status: DC

## 2021-09-02 MED ORDER — QUETIAPINE FUMARATE 25 MG PO TABS: ORAL_TABLET | ORAL | 0 refills | Status: DC

## 2021-09-02 NOTE — Progress Notes (Signed)
Tanya Simpson [id number] [birth year] 60 y.o.  Subjective:   Patient ID:  Tanya Simpson is a 60 y.o. (DOB 1960-11-11) female.  Chief Complaint:  Chief Complaint  Patient presents with   Follow-up    Mood disturbance    HPI Tanya Simpson presents to the office today for follow-up of mood disturbance. She reports that she is still tired with meds "but getting better." Jogged recently. Denies depressed mood and reports that she experiences only occ mild sadness in response to triggers. Reports that anxiety has been ok. Sleeping well. Appetite has been good. Energy and motivation ok aside from occ feeling tired. No impulsive spending and reports that she has been trying to save money. Denies SI.  Traveling to [place] in a couple of weeks.   Started a new job with [employer].   Saw therapist at [facility].   AIMS    Flowsheet Row Office Visit from 04/15/2021 in [facility] Office Visit from 11/05/2020 in [facility] Office Visit from 03/25/2020 in [facility] Office Visit from 01/17/2020 in [facility] Office Visit from 11/09/2019 in [facility]  AIMS Total Score 1 0 0 1 2      GAD-7    Flowsheet Row Office Visit from 04/20/2018 in [facility] Office Visit from 03/28/2018 in [facility] Office Visit from 03/14/2018 in [facility] Office Visit from 02/28/2018 in [facility] Office Visit from 10/11/2017 in [facility]  Total GAD-7 Score 3 3 1 3 3       PHQ2-9    Flowsheet Row Office Visit from 12/29/2020 in [facility] Office Visit from 07/04/2020 in [facility] Office Visit from 03/26/2020 in [facility] Office Visit from 05/30/2019 in [facility] Office Visit from 12/19/2018 in [facility]   PHQ-2 Total Score 0 1 1 0 0  PHQ-9 Total Score 2 5 -- -- --      Flowsheet Row ED from 06/20/2021 in [facility] ED to Hosp-Admission (Discharged) from 03/28/2020 in [facility]  C-SSRS RISK CATEGORY No Risk High Risk        Review of Systems:  Review of Systems  Musculoskeletal:  Negative for back pain and gait problem.  Neurological:  Negative for dizziness.  Psychiatric/Behavioral:         Please refer to HPI   Medications: I have reviewed the patient's current medications.  Current Outpatient Medications  Medication Sig Dispense Refill   acetaminophen (TYLENOL) 325 MG tablet Take 650 mg by mouth every 6 (six) hours as needed.     benztropine (COGENTIN) 1 MG tablet Take 1 tablet (1 mg total) by mouth 2 (two) times daily. 180 tablet 0   BIOTIN PO Take by mouth.     haloperidol (HALDOL) 20 MG tablet Take 1 tablet (20 mg total) by mouth at bedtime. 90 tablet 0   lithium carbonate (LITHOBID) 300 MG CR tablet Take 1 tablet (300 mg total) by mouth every morning. 90 tablet 0   Prenatal Vit-Fe Fumarate-FA (PRENATAL VITAMINS) 28-0.8 MG TABS Take 1 tablet by mouth daily. 90 tablet 1   QUEtiapine (SEROQUEL) 25 MG tablet Take 2-3 tabs po QHS 270 tablet 0   zinc gluconate 50 MG tablet Take 50 mg by mouth daily.     No  current facility-administered medications for this visit.    Medication Side Effects: Other: None  Allergies: No Known Allergies  Past Medical History:  Diagnosis Date   Allergic rhinitis    Anxiety    Depression    Schizophrenia (HCC)     Past Medical History, Surgical history, Social history, and Family history were reviewed and updated as appropriate.   Please see review of systems for further details on the patient's review from today.   Objective:   Physical Exam:  There were no vitals taken for this visit.  Physical Exam Constitutional:      General: She is not in acute  distress. Musculoskeletal:        General: No deformity.  Neurological:     Mental Status: She is alert and oriented to person, place, and time.     Coordination: Coordination normal.  Psychiatric:        Attention and Perception: Attention and perception normal. She does not perceive auditory or visual hallucinations.        Mood and Affect: Mood is anxious. Mood is not depressed. Affect is not labile, blunt, angry or inappropriate.        Speech: Speech is rapid and pressured.        Behavior: Behavior is hyperactive.        Thought Content: Thought content normal. Thought content is not paranoid or delusional. Thought content does not include homicidal or suicidal ideation. Thought content does not include homicidal or suicidal plan.        Cognition and Memory: Cognition and memory normal.        Judgment: Judgment normal.     Comments: Insight intact    Lab Review:     Component Value Date/Time   NA 135 12/29/2020 1458   K 4.2 12/29/2020 1458   CL 99 12/29/2020 1458   CO2 22 12/29/2020 1458   GLUCOSE 85 12/29/2020 1458   GLUCOSE 105 (H) 04/01/2020 0348   BUN 15 12/29/2020 1458   CREATININE 0.78 12/29/2020 1458   CALCIUM 9.2 12/29/2020 1458   PROT 6.7 12/29/2020 1458   ALBUMIN 4.6 12/29/2020 1458   AST 18 12/29/2020 1458   ALT 15 12/29/2020 1458   ALKPHOS 65 12/29/2020 1458   BILITOT 0.2 12/29/2020 1458   GFRNONAA >60 04/01/2020 0348   GFRAA >60 04/01/2020 0348       Component Value Date/Time   WBC 9.5 12/29/2020 1458   WBC 10.7 (H) 04/01/2020 0348   RBC 4.21 12/29/2020 1458   RBC 4.01 04/01/2020 0348   HGB 13.3 12/29/2020 1458   HCT 39.1 12/29/2020 1458   PLT 331 04/01/2020 0348   PLT 399 03/26/2020 1226   MCV 93 12/29/2020 1458   MCH 31.6 12/29/2020 1458   MCH 31.2 04/01/2020 0348   MCHC 34.0 12/29/2020 1458   MCHC 33.4 04/01/2020 0348   RDW 11.9 12/29/2020 1458   LYMPHSABS 2.4 12/29/2020 1458   MONOABS 0.8 03/28/2020 1646   EOSABS 0.3 12/29/2020 1458    BASOSABS 0.1 12/29/2020 1458    Lithium Lvl  Date Value Ref Range Status  08/05/2021 0.3 (L) 0.5 - 1.2 mmol/L Final    Comment:    A concentration of 0.5-0.8 mmol/L is advised for long-term use; concentrations of up to 1.2 mmol/L may be necessary during acute treatment.  Detection Limit = 0.1                           <0.1 indicates None Detected      No results found for: PHENYTOIN, PHENOBARB, VALPROATE, CBMZ   .res Assessment: Plan:   Will continue current plan of care. Pt reports that she is aware that Lithium level was low on last blood draw but prefers not to increase dose due to side effects.  Continue Haldol 20 mg po QHS for mood s/s.  Continue Lithium CR 300 mg daily for mood s/s. Continue Seroquel 50-75 mg po QHS for mood and insomnia.  Pt to follow-up in 2 months or sooner if clinically indicated.  Patient advised to contact office with any questions, adverse effects, or acute worsening in signs and symptoms.  Nakema was seen today for follow-up.  Diagnoses and all orders for this visit:  Tardive dyskinesia -     benztropine (COGENTIN) 1 MG tablet; Take 1 tablet (1 mg total) by mouth 2 (two) times daily.  Bipolar affective disorder, remission status unspecified (HCC) -     haloperidol (HALDOL) 20 MG tablet; Take 1 tablet (20 mg total) by mouth at bedtime. -     lithium carbonate (LITHOBID) 300 MG CR tablet; Take 1 tablet (300 mg total) by mouth every morning. -     QUEtiapine (SEROQUEL) 25 MG tablet; Take 2-3 tabs po QHS    Please see After Visit Summary for patient specific instructions.  Future Appointments  Date Time Provider Department Center  11/04/2021 11:00 AM Corie Chiquito, PMHNP [facility] None  11/26/2021 10:00 AM Linna Darner, RD [facility]    No orders of the defined types were placed in this encounter.   -------------------------------

## 2021-09-14 ENCOUNTER — Telehealth: Payer: Self-pay | Admitting: Psychiatry

## 2021-09-14 NOTE — Telephone Encounter (Signed)
Glori called wanting to speak with you.  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  Sop She wanted to touch base with you just to say Happy Holidays and discuss future plans.  Please call [phone]

## 2021-09-15 NOTE — Telephone Encounter (Signed)
Returned call to pt. She reports that she signed up a medicare advantage plan that does not cover [facility]. She plans to transfer her care to [facility]. Discussed process and that records could be transferred. Discussed that this provider can provide refills while she is waiting to establish care with new provider in her future network.

## 2021-11-04 ENCOUNTER — Ambulatory Visit: Payer: Medicare Other | Admitting: Psychiatry

## 2021-11-26 ENCOUNTER — Ambulatory Visit: Payer: Medicare Other | Admitting: Family Medicine

## 2022-03-09 ENCOUNTER — Encounter: Payer: Self-pay | Admitting: *Deleted

## 2023-04-26 ENCOUNTER — Encounter: Payer: Self-pay | Admitting: Psychiatry

## 2023-06-28 ENCOUNTER — Other Ambulatory Visit: Payer: Self-pay | Admitting: Student

## 2023-06-28 DIAGNOSIS — Z1231 Encounter for screening mammogram for malignant neoplasm of breast: Secondary | ICD-10-CM

## 2023-08-10 ENCOUNTER — Ambulatory Visit: Payer: Medicare Other

## 2023-08-17 ENCOUNTER — Encounter: Payer: Self-pay | Admitting: Psychiatry

## 2024-03-13 ENCOUNTER — Encounter: Payer: Self-pay | Admitting: *Deleted

## 2024-10-25 ENCOUNTER — Ambulatory Visit (INDEPENDENT_AMBULATORY_CARE_PROVIDER_SITE_OTHER): Admitting: Emergency Medicine

## 2024-10-25 ENCOUNTER — Encounter: Payer: Self-pay | Admitting: Emergency Medicine

## 2024-10-25 DIAGNOSIS — G2401 Drug induced subacute dyskinesia: Secondary | ICD-10-CM

## 2024-10-25 DIAGNOSIS — F5105 Insomnia due to other mental disorder: Secondary | ICD-10-CM | POA: Diagnosis not present

## 2024-10-25 DIAGNOSIS — F319 Bipolar disorder, unspecified: Secondary | ICD-10-CM | POA: Diagnosis not present

## 2024-10-25 MED ORDER — LITHIUM CARBONATE ER 300 MG PO TBCR: 300.0000 mg | EXTENDED_RELEASE_TABLET | Freq: Every morning | ORAL | 0 refills | Status: AC

## 2024-10-25 MED ORDER — BENZTROPINE MESYLATE 1 MG PO TABS: 1.0000 mg | ORAL_TABLET | Freq: Two times a day (BID) | ORAL | 0 refills | Status: AC

## 2024-10-25 MED ORDER — HALOPERIDOL 20 MG PO TABS: 20.0000 mg | ORAL_TABLET | Freq: Every day | ORAL | 0 refills | Status: AC

## 2024-10-25 MED ORDER — QUETIAPINE FUMARATE 25 MG PO TABS: ORAL_TABLET | ORAL | 0 refills | Status: AC

## 2024-10-25 NOTE — Progress Notes (Signed)
 Tanya Simpson [id number] [birth year] 64 y.o.  Subjective:   Patient ID:  Tanya Simpson is a 64 y.o. (DOB [birth year]) female.  Chief Complaint:  Chief Complaint  Patient presents with   Follow-up   HPI [name] presents to the office today for follow-up for medication management and to establish care with new psychiatric provider following insurance changes.   Pt was last seen at [facility] by Harlene Pepper on 09/02/2021. She subsequently transferred to [facility] due to insurance coverage changes, then to Cletis Kohut at [facility] for 1-2 years of treatment. Recent PCP referral from Dr. Lamar Cornet [facility].   Current Psych Medication Regimen: No SE reported. Haldol  20 mg po QHS for mood s/s.  Benztropine  1 MG tablet po BID Seroquel  50-75 mg po QHS for mood and insomnia. Lithium  CR 300 mg daily for mood s/s.  Pt has been stable on medication regimen for the past 5+ years. She does not want to make any medication changes at this time. Denies depressive symptoms such as sadness, tearfulness, or hopelessness. Energy and motivation are stable; denies anhedonia. Cognitive functions like concentration, decision-making, and memory are intact. Denies psychomotor agitation or retardation. No excessive guilt or feelings of worthlessness.    Anxiety symptoms are well controlled with decreased worry and restlessness; no panic attacks reported. Sleep is adequate and restful with 10 hours nightly, with few awakenings due to urination and thirst. Appetite is stable, with normal weight and intact ADLs and personal hygiene. Ongoing symptom monitoring continues.   Not engaged in therapy currently, but possibly interested in starting therapy in the future.   Denies mania, delirium, AVH, SI, HI or self-harm behaviors. No further complaints at this time.  AIMS    Flowsheet Row Office Visit from  04/15/2021 in [facility] Office Visit from 11/05/2020 in [facility] Office Visit from 03/25/2020 in [facility] Office Visit from 01/17/2020 in [facility] Office Visit from 11/09/2019 in [facility]  AIMS Total Score 1 0 0 1 2   GAD-7    Flowsheet Row Office Visit from 04/20/2018 in [facility] - [facility] Of [place]. [facility] Office Visit from 03/28/2018 in [facility] - A Dept Of [place]. [facility] Office Visit from 03/14/2018 in [facility] - A Dept Of [place]. [facility] Office Visit from 02/28/2018 in [facility] - A Dept Of [place]. [facility] Office Visit from 10/11/2017 in [facility] - A Dept Of [place]. [facility]  Total GAD-7 Score 3 3 1 3 3    PHQ2-9    Flowsheet Row Office Visit from 12/29/2020 in [facility] - A Dept Of [place]. [facility] Office Visit from 07[facility]04/2020 in [facility] - A Dept Of [place]. [facility] Office Visit from 03/26/2020 in [facility] - A Dept Of [place]. [facility] Office Visit from 05/30/2019 in [facility] - A Dept Of [place]. [facility] Office Visit from 12/19/2018 in [facility] - A Dept Of . [facility]  PHQ-2 Total Score 0 1 1 0 0  PHQ-9 Total Score 2 5 -- -- --  Flowsheet Row ED from 06/20/2021 in [facility] at [facility] ED to Hosp-Admission (Discharged) from 03/28/2020 in [facility]  C-SSRS RISK CATEGORY No Risk High Risk     Review of Systems:  Review of Systems  Psychiatric/Behavioral:         Please refer to HPI.  All other systems reviewed and are negative.  Past medications  for mental health diagnoses include: Depakote Lamictal  Haldol  Olanzapine Risperdal Seroquel  Lithium   Medications: I have reviewed the patient's current medications.  Current Outpatient Medications  Medication Sig Dispense Refill   acetaminophen  (TYLENOL ) 325 MG tablet Take 650 mg by mouth every 6 (six) hours as needed.     benztropine  (COGENTIN ) 1 MG tablet Take 1 tablet (1 mg total) by mouth 2 (two) times daily. 180 tablet 0   BIOTIN PO Take by mouth.     cholecalciferol (VITAMIN D3) 25 MCG (1000 UNIT) tablet Take 1,000 Units by mouth daily.     haloperidol  (HALDOL ) 20 MG tablet Take 1 tablet (20 mg total) by mouth at bedtime. 90 tablet 0   lithium  carbonate (LITHOBID ) 300 MG CR tablet Take 1 tablet (300 mg total) by mouth every morning. 90 tablet 0   QUEtiapine  (SEROQUEL ) 25 MG tablet Take 2-3 tabs po QHS 270 tablet 0   zinc gluconate 50 MG tablet Take 50 mg by mouth daily.     Prenatal Vit-Fe Fumarate-FA (PRENATAL VITAMINS) 28-0.8 MG TABS Take 1 tablet by mouth daily. (Patient not taking: Reported on 10/25/2024) 90 tablet 1   No current facility-administered medications for this visit.    Medication Side Effects: None  Allergies: Allergies[1]  Past Medical History:  Diagnosis Date   Allergic rhinitis    Anxiety    Depression    Schizophrenia (HCC)     Past Medical History, Surgical history, Social history, and Family history were reviewed and updated as appropriate.   Please see review of systems for further details on the patient's review from today.   Objective:   Physical Exam:  There were no vitals taken for this visit.  Physical Exam  Lab Review:     Component Value Date/Time   NA 135 12/29/2020 1458   K 4.2 12/29/2020 1458   CL 99 12/29/2020 1458   CO2 22 12/29/2020 1458   GLUCOSE 85 12/29/2020 1458   GLUCOSE 105 (H) 04/01/2020 0348   BUN 15 12/29/2020 1458   CREATININE 0.78 12/29/2020 1458   CALCIUM  9.2 12/29/2020 1458   PROT 6.7 12/29/2020 1458    ALBUMIN 4.6 12/29/2020 1458   AST 18 12/29/2020 1458   ALT 15 12/29/2020 1458   ALKPHOS 65 12/29/2020 1458   BILITOT 0.2 12/29/2020 1458   GFRNONAA >60 04/01/2020 0348   GFRAA >60 04/01/2020 0348       Component Value Date/Time   WBC 9.5 12/29/2020 1458   WBC 10.7 (H) 04/01/2020 0348   RBC 4.21 12/29/2020 1458   RBC 4.01 04/01/2020 0348   HGB 13.3 12/29/2020 1458   HCT 39.1 12/29/2020 1458   PLT 331 04/01/2020 0348   PLT 399 03/26/2020 1226   MCV 93 12/29/2020 1458   MCH 31.6 12/29/2020 1458   MCH 31.2 04/01/2020 0348   MCHC 34.0 12/29/2020 1458   MCHC 33.4 04/01/2020 0348   RDW 11.9 12/29/2020 1458   LYMPHSABS 2.4 12/29/2020 1458   MONOABS 0.8 03/28/2020 1646   EOSABS 0.3 12/29/2020 1458   BASOSABS 0.1 12/29/2020 1458  Lithium  Lvl  Date Value Ref Range Status  08/05/2021 0.3 (L) 0.5 - 1.2 mmol/L Final    Comment:    A concentration of 0.5-0.8 mmol/L is advised for long-term use; concentrations of up to 1.2 mmol/L may be necessary during acute treatment.                                  Detection Limit = 0.1                           <0.1 indicates None Detected      No results found for: PHENYTOIN, PHENOBARB, VALPROATE, CBMZ   .res Assessment: Plan:    Abrianna was seen today for follow-up.  Diagnoses and all orders for this visit:  Bipolar affective disorder, remission status unspecified (HCC)  Tardive dyskinesia  Insomnia due to mental condition    Please see After Visit Summary for patient specific instructions.  No future appointments.   No orders of the defined types were placed in this encounter.   I provided approximately 30 minutes of face to face time during this encounter, including time spent before and after the visit in records review, medical decision making, counseling pertinent to today's visit, and charting.   Discussed dx and tx plan. Discussed alternative options including therapy.   PDMP reviewed: low risk trend    Bipolar Affective Disorder, remission status - controlled Continue Lithium  CR 300 mg daily for mood s/s. Ordered labs - Lithium  levels, TSH, CMP; pt will complete at [facility] patient regarding potential benefits, risks, and side effects of lithium  to include potential risk of lithium  affecting thyroid  and renal function.  Discussed need for periodic lab monitoring to determine drug level and to assess for potential adverse effects.  Counseled patient regarding signs and symptoms of lithium  toxicity which include worsening tremor, unsteadiness, slurred speech, confusion, nausea and vomiting, or diarrhea. Patient was advised to notify office immediately or seek urgent medical attention if experiencing these signs and symptoms.  They should contact the office with any questions or concerns.  Continue Haldol  20 mg po QHS for mood s/s.  Refilled with 90 day supply Discussed potential metabolic side effects associated with atypical antipsychotics.  Labs will need to be drawn periodically to monitor metabolic function. Discussed potential risk for movement side effects. Patient understands and accepts these risks and has been advised to contact office if any movement side effects occur.   Tardive Dyskinesia - controlled Continue benztropine  1 MG tablet; Take 1 tablet by mouth BID Refilled 90 day supply  Insomnia - controlled Continue Seroquel  50-75 mg po QHS for mood and insomnia. Refilled 90 day supply Provided education sleep hygiene: Maintain a consistent sleep/wake schedule, limit naps, and optimize the bedroom to be dark, quiet, and cool. Advised avoiding caffeine,heavy meals, and/or nicotine/alcohol several hours before bedtime and reducing screen using other stimulating activities an hour before bed/ Recommended a relaxing free sleep routines and regular daytime physical activity while avoiding vigorous exercising at bedtime; patient to track sleep and bring concerns to  follow-up  Advised to keep a medication side effect journal to systematically track any symptoms, noting onset, frequency, severity, and context of side effects. Psychotherapy was strongly recommended as important adjunct treatment to medication - provided list of local counselors.  Spent approximately 20 minutes providing education and counseling on therapeutic lifestyle modifications and sleep hygiene Emphasizing  the importance of regular exercise of at least 3x a week for 30 min, incorporating healthier diet options with a focus on balanced nutrition and reduced processed foods, and considering appropriate supplementation including multi vitamin, vitamin d, b 12, magnesium  glycinate, iron to support overall well-being.  Provided education sleep hygiene: Maintain a consistent sleep/wake schedule, limit naps, and optimize the bedroom to be dark, quiet, and cool. Advised avoiding caffeine,heavy meals, and/or nicotine/alcohol several hours before bedtime and reducing screen using other stimulating activities an hour before bed/ Recommended a relaxing free sleep routines and regular daytime physical activity while avoiding vigorous exercising at bedtime; patient to track sleep and bring concerns to follow-up  FOLLOW UP: 3 months or sooner if clinically indicated  Risks, benefits, and alternatives of the medications and treatment plan prescribed today were discussed. Patient engaged in shared decision-making;treatment plan reviewed and agreed upon.    Keirstyn Aydt PA-C, DMSc     [1] No Known Allergies

## 2024-10-31 ENCOUNTER — Telehealth: Payer: Self-pay

## 2024-11-07 ENCOUNTER — Other Ambulatory Visit: Payer: Self-pay | Admitting: Emergency Medicine

## 2024-11-08 LAB — COMPREHENSIVE METABOLIC PANEL WITH GFR
ALT: 14 [IU]/L (ref 0–32)
AST: 17 [IU]/L (ref 0–40)
Albumin: 4.4 g/dL (ref 3.9–4.9)
Alkaline Phosphatase: 70 [IU]/L (ref 49–135)
BUN/Creatinine Ratio: 14 (ref 12–28)
BUN: 10 mg/dL (ref 8–27)
Bilirubin Total: 0.4 mg/dL (ref 0.0–1.2)
CO2: 23 mmol/L (ref 20–29)
Calcium: 9.6 mg/dL (ref 8.7–10.3)
Chloride: 100 mmol/L (ref 96–106)
Creatinine, Ser: 0.69 mg/dL (ref 0.57–1.00)
Globulin, Total: 2.5 g/dL (ref 1.5–4.5)
Glucose: 79 mg/dL (ref 70–99)
Potassium: 4.9 mmol/L (ref 3.5–5.2)
Sodium: 137 mmol/L (ref 134–144)
Total Protein: 6.9 g/dL (ref 6.0–8.5)
eGFR: 97 mL/min/{1.73_m2}

## 2024-11-08 LAB — LITHIUM LEVEL: Lithium Lvl: 0.2 mmol/L — ABNORMAL LOW (ref 0.5–1.2)

## 2024-11-08 LAB — TSH: TSH: 1.72 u[IU]/mL (ref 0.450–4.500)

## 2025-01-24 ENCOUNTER — Ambulatory Visit: Payer: Medicare (Managed Care) | Admitting: Emergency Medicine
